# Patient Record
Sex: Female | Born: 1950
Health system: Southern US, Community
[De-identification: ages and names within clinical notes are randomized; demographics above are authoritative.]

## PROBLEM LIST (undated history)

## (undated) DIAGNOSIS — R319 Hematuria, unspecified: Secondary | ICD-10-CM

## (undated) DIAGNOSIS — R112 Nausea with vomiting, unspecified: Secondary | ICD-10-CM

## (undated) DIAGNOSIS — R300 Dysuria: Secondary | ICD-10-CM

## (undated) DIAGNOSIS — R0789 Other chest pain: Principal | ICD-10-CM

## (undated) DIAGNOSIS — Z87442 Personal history of urinary calculi: Secondary | ICD-10-CM

## (undated) DIAGNOSIS — Z973 Presence of spectacles and contact lenses: Secondary | ICD-10-CM

## (undated) DIAGNOSIS — Z9889 Other specified postprocedural states: Secondary | ICD-10-CM

## (undated) DIAGNOSIS — R350 Frequency of micturition: Secondary | ICD-10-CM

## (undated) DIAGNOSIS — N201 Calculus of ureter: Secondary | ICD-10-CM

## (undated) DIAGNOSIS — E669 Obesity, unspecified: Secondary | ICD-10-CM

## (undated) DIAGNOSIS — R351 Nocturia: Secondary | ICD-10-CM

## (undated) DIAGNOSIS — E785 Hyperlipidemia, unspecified: Secondary | ICD-10-CM

## (undated) DIAGNOSIS — M1990 Unspecified osteoarthritis, unspecified site: Secondary | ICD-10-CM

## (undated) DIAGNOSIS — E559 Vitamin D deficiency, unspecified: Secondary | ICD-10-CM

## (undated) DIAGNOSIS — I10 Essential (primary) hypertension: Secondary | ICD-10-CM

## (undated) DIAGNOSIS — R7303 Prediabetes: Secondary | ICD-10-CM

## (undated) DIAGNOSIS — E1169 Type 2 diabetes mellitus with other specified complication: Secondary | ICD-10-CM

## (undated) DIAGNOSIS — R3915 Urgency of urination: Secondary | ICD-10-CM

## (undated) DIAGNOSIS — Z87448 Personal history of other diseases of urinary system: Secondary | ICD-10-CM

## (undated) DIAGNOSIS — K219 Gastro-esophageal reflux disease without esophagitis: Secondary | ICD-10-CM

## (undated) DIAGNOSIS — M549 Dorsalgia, unspecified: Secondary | ICD-10-CM

## (undated) HISTORY — PX: CHOLECYSTECTOMY: SHX55

## (undated) HISTORY — DX: Type 2 diabetes mellitus with other specified complication: E11.69

## (undated) HISTORY — PX: EXTRACORPOREAL SHOCK WAVE LITHOTRIPSY: SHX1557

## (undated) HISTORY — DX: Vitamin D deficiency, unspecified: E55.9

## (undated) HISTORY — DX: Other chest pain: R07.89

## (undated) HISTORY — DX: Obesity, unspecified: E66.9

## (undated) HISTORY — DX: Personal history of other diseases of urinary system: Z87.448

## (undated) HISTORY — DX: Hyperlipidemia, unspecified: E78.5

## (undated) HISTORY — DX: Dorsalgia, unspecified: M54.9

---

## 1998-12-13 ENCOUNTER — Encounter: Payer: Self-pay | Admitting: Family Medicine

## 1998-12-13 ENCOUNTER — Ambulatory Visit (HOSPITAL_COMMUNITY): Admission: RE | Admit: 1998-12-13 | Discharge: 1998-12-13 | Payer: Self-pay | Admitting: Family Medicine

## 2001-06-23 ENCOUNTER — Encounter: Payer: Self-pay | Admitting: Family Medicine

## 2001-06-23 ENCOUNTER — Ambulatory Visit (HOSPITAL_COMMUNITY): Admission: RE | Admit: 2001-06-23 | Discharge: 2001-06-23 | Payer: Self-pay | Admitting: Family Medicine

## 2001-10-10 ENCOUNTER — Ambulatory Visit (HOSPITAL_COMMUNITY): Admission: RE | Admit: 2001-10-10 | Discharge: 2001-10-10 | Payer: Self-pay | Admitting: Gastroenterology

## 2004-11-09 ENCOUNTER — Encounter: Admission: RE | Admit: 2004-11-09 | Discharge: 2004-11-09 | Payer: Self-pay | Admitting: Family Medicine

## 2011-03-01 ENCOUNTER — Other Ambulatory Visit: Payer: Self-pay | Admitting: Family Medicine

## 2011-03-01 ENCOUNTER — Other Ambulatory Visit (HOSPITAL_COMMUNITY)
Admission: RE | Admit: 2011-03-01 | Discharge: 2011-03-01 | Disposition: A | Discharge status: Home / Self Care | Payer: BC Managed Care – PPO | Source: Ambulatory Visit | Attending: Family Medicine | Admitting: Family Medicine

## 2011-03-01 DIAGNOSIS — Z0000 Encounter for general adult medical examination without abnormal findings: Secondary | ICD-10-CM | POA: Insufficient documentation

## 2011-04-20 ENCOUNTER — Other Ambulatory Visit: Payer: Self-pay | Admitting: Gastroenterology

## 2012-11-25 ENCOUNTER — Emergency Department (HOSPITAL_COMMUNITY): Payer: BC Managed Care – PPO

## 2012-11-25 ENCOUNTER — Emergency Department (HOSPITAL_COMMUNITY)
Admission: EM | Admit: 2012-11-25 | Discharge: 2012-11-26 | Disposition: A | Discharge status: Home / Self Care | Payer: BC Managed Care – PPO | Attending: Emergency Medicine | Admitting: Emergency Medicine

## 2012-11-25 ENCOUNTER — Encounter (HOSPITAL_COMMUNITY): Payer: Self-pay | Admitting: *Deleted

## 2012-11-25 DIAGNOSIS — E119 Type 2 diabetes mellitus without complications: Secondary | ICD-10-CM | POA: Insufficient documentation

## 2012-11-25 DIAGNOSIS — Z79899 Other long term (current) drug therapy: Secondary | ICD-10-CM | POA: Insufficient documentation

## 2012-11-25 DIAGNOSIS — N200 Calculus of kidney: Secondary | ICD-10-CM | POA: Insufficient documentation

## 2012-11-25 DIAGNOSIS — N390 Urinary tract infection, site not specified: Secondary | ICD-10-CM | POA: Insufficient documentation

## 2012-11-25 LAB — CBC WITH DIFFERENTIAL/PLATELET
Basophils Relative: 0 % (ref 0–1)
Eosinophils Absolute: 0.2 10*3/uL (ref 0.0–0.7)
Eosinophils Relative: 2 % (ref 0–5)
HCT: 41.9 % (ref 36.0–46.0)
Hemoglobin: 15.4 g/dL — ABNORMAL HIGH (ref 12.0–15.0)
Lymphs Abs: 4.1 10*3/uL — ABNORMAL HIGH (ref 0.7–4.0)
MCH: 31.2 pg (ref 26.0–34.0)
MCHC: 36.8 g/dL — ABNORMAL HIGH (ref 30.0–36.0)
MCV: 84.8 fL (ref 78.0–100.0)
Monocytes Absolute: 0.7 10*3/uL (ref 0.1–1.0)
Monocytes Relative: 7 % (ref 3–12)
Neutrophils Relative %: 50 % (ref 43–77)

## 2012-11-25 LAB — COMPREHENSIVE METABOLIC PANEL
Albumin: 3.8 g/dL (ref 3.5–5.2)
BUN: 15 mg/dL (ref 6–23)
Creatinine, Ser: 0.71 mg/dL (ref 0.50–1.10)
GFR calc Af Amer: >90 mL/min (ref >90)
Glucose, Bld: 237 mg/dL — ABNORMAL HIGH (ref 70–99)
Total Protein: 7 g/dL (ref 6.0–8.3)

## 2012-11-25 LAB — URINE MICROSCOPIC-ADD ON

## 2012-11-25 LAB — URINALYSIS, ROUTINE W REFLEX MICROSCOPIC
Glucose, UA: NEGATIVE mg/dL
Ketones, ur: NEGATIVE mg/dL
Nitrite: NEGATIVE
Protein, ur: 100 mg/dL — AB
pH: 5.5 (ref 5.0–8.0)

## 2012-11-25 MED ORDER — SODIUM CHLORIDE 0.9 % IV SOLN
Freq: Once | INTRAVENOUS | Status: AC
  Administered 2012-11-25: 21:00:00 via INTRAVENOUS

## 2012-11-25 MED ORDER — DEXTROSE 5 % IV SOLN
1.0000 g | INTRAVENOUS | Status: DC
  Administered 2012-11-25: 1 g via INTRAVENOUS
  Filled 2012-11-25: qty 10

## 2012-11-25 MED ORDER — ONDANSETRON 4 MG PO TBDP
8.0000 mg | ORAL_TABLET | Freq: Once | ORAL | Status: AC
  Administered 2012-11-25: 8 mg via ORAL
  Filled 2012-11-25: qty 2

## 2012-11-25 MED ORDER — KETOROLAC TROMETHAMINE 30 MG/ML IJ SOLN
30.0000 mg | Freq: Once | INTRAMUSCULAR | Status: AC
  Administered 2012-11-25: 30 mg via INTRAVENOUS
  Filled 2012-11-25: qty 1

## 2012-11-25 MED ORDER — ONDANSETRON HCL 4 MG/2ML IJ SOLN
4.0000 mg | Freq: Once | INTRAMUSCULAR | Status: AC
  Administered 2012-11-25: 4 mg via INTRAVENOUS
  Filled 2012-11-25: qty 2

## 2012-11-25 MED ORDER — HYDROMORPHONE HCL PF 1 MG/ML IJ SOLN
1.0000 mg | Freq: Once | INTRAMUSCULAR | Status: AC
  Administered 2012-11-25: 1 mg via INTRAVENOUS
  Filled 2012-11-25: qty 1

## 2012-11-25 NOTE — ED Provider Notes (Signed)
CSN: [id number]     Arrival date & time 11/25/12  2029 History   First MD Initiated Contact with Patient 11/25/12 2101     Chief Complaint  Patient presents with  . Flank Pain   (Consider location/radiation/quality/duration/timing/severity/associated sxs/prior Treatment) HPI Comments: Patient states, last night.  She started having some dysuria, urgency.  This has developed into right flank pain, nausea.  She, states she feels, like she is not emptying her bladder completely.  She does have a remote history of kidney stones years ago, requiring lithotripsy is no problems with the.   Patient is a 62 y.o. female presenting with flank pain. The history is provided by the patient.  Flank Pain This is a new problem. The current episode started yesterday. The problem occurs constantly. The problem has been gradually worsening. Associated symptoms include abdominal pain, chills, nausea and urinary symptoms. Pertinent negatives include no chest pain, fever, vomiting or weakness. Exacerbated by: Urinating. She has tried nothing for the symptoms. The treatment provided no relief.    Past Medical History  Diagnosis Date  . Kidney stones   . Diabetes mellitus without complication    Past Surgical History  Procedure Laterality Date  . Kidney stone surgery    . Cholecystectomy     History reviewed. No pertinent family history. History  Substance Use Topics  . Smoking status: Never Smoker   . Smokeless tobacco: Not on file  . Alcohol Use: No   OB History   Grav Para Term Preterm Abortions TAB SAB Ect Mult Living                 Review of Systems  Constitutional: Positive for chills. Negative for fever.  Cardiovascular: Negative for chest pain.  Gastrointestinal: Positive for nausea and abdominal pain. Negative for vomiting, diarrhea and constipation.  Genitourinary: Positive for dysuria, frequency and flank pain.  Musculoskeletal: Positive for back pain.  Neurological: Negative for  weakness.  All other systems reviewed and are negative.    Allergies  Review of patient's allergies indicates no known allergies.  Home Medications   Current Outpatient Rx  Name  Route  Sig  Dispense  Refill  . hyoscyamine (LEVSIN, ANASPAZ) 0.125 MG tablet   Oral   Take 0.125 mg by mouth every 4 (four) hours as needed for cramping.         . Vitamin D, Ergocalciferol, (DRISDOL) 50000 UNITS CAPS capsule   Oral   Take 50,000 Units by mouth.         . ciprofloxacin (CIPRO) 500 MG tablet   Oral   Take 1 tablet (500 mg total) by mouth 2 (two) times daily.   14 tablet   0   . HYDROcodone-acetaminophen (NORCO/VICODIN) 5-325 MG per tablet   Oral   Take 2 tablets by mouth every 4 (four) hours as needed for pain.   10 tablet   0   . promethazine (PHENERGAN) 25 MG tablet   Oral   Take 1 tablet (25 mg total) by mouth every 6 (six) hours as needed for nausea.   30 tablet   0    BP 119/56  Pulse 70  Temp(Src) 97.5 F (36.4 C) (Oral)  Resp 20  Ht 5\' 4"  (1.626 m)  Wt 186 lb 9.6 oz (84.641 kg)  BMI 32.01 kg/m2  SpO2 97% Physical Exam  Nursing note and vitals reviewed. Constitutional: She is oriented to person, place, and time. She appears well-developed and well-nourished.  HENT:  Head: Normocephalic.  Eyes: Pupils are equal, round, and reactive to light.  Neck: Normal range of motion.  Cardiovascular: Normal rate and regular rhythm.   Pulmonary/Chest: Effort normal.  Abdominal: Soft. She exhibits no distension. There is tenderness in the suprapubic area. There is CVA tenderness.  Musculoskeletal: Normal range of motion.  Neurological: She is alert and oriented to person, place, and time.  Skin: Skin is warm and dry. No rash noted. No erythema.    ED Course  Procedures (including critical care time) Labs Review Labs Reviewed  CBC WITH DIFFERENTIAL - Abnormal; Notable for the following:    Hemoglobin 15.4 (*)    MCHC 36.8 (*)    Lymphs Abs 4.1 (*)    All other  components within normal limits  COMPREHENSIVE METABOLIC PANEL - Abnormal; Notable for the following:    Glucose, Bld 237 (*)    Total Bilirubin 0.2 (*)    All other components within normal limits  URINALYSIS, ROUTINE W REFLEX MICROSCOPIC - Abnormal; Notable for the following:    APPearance CLOUDY (*)    Hgb urine dipstick LARGE (*)    Protein, ur 100 (*)    Leukocytes, UA MODERATE (*)    All other components within normal limits  URINE MICROSCOPIC-ADD ON - Abnormal; Notable for the following:    Squamous Epithelial / LPF FEW (*)    Crystals CA OXALATE CRYSTALS (*)    All other components within normal limits   Imaging Review Ct Abdomen Pelvis Wo Contrast  11/25/2012   *RADIOLOGY REPORT*  Clinical Data: Right flank pain.  History of stones.  CT ABDOMEN AND PELVIS WITHOUT CONTRAST  Technique:  Multidetector CT imaging of the abdomen and pelvis was performed following the standard protocol without intravenous contrast.  Comparison: None.  Findings:  Lung bases: Distal esophagus mildly distended with air.  Mild bibasilar atelectasis.  Negative for pleural or pericardial effusion.  Abdomen/pelvis:  There is diffuse fatty infiltration of the liver. No focal hepatic lesion is identified.  There is no biliary ductal dilatation in this patient status post cholecystectomy.  The spleen, pancreas, adrenal glands, and left kidney are within normal limits.  No intrarenal calculi are seen on the left.  There are least two small stones in the calyces of the lower pole of the right kidney, the largest measuring 3 mm.   In the proximal right ureter is an oval stone measuring 10 x 5 mm axial diameter and 14 mm in length.  Distal to this stone, there is moderate right ureteral dilatation, secondary to a second, distal ureteral stone that measures 3 x 3 mm, positioned approximately 2 cm proximal to the ureterovesical junction.  There is moderate right hydronephrosis.  Negative for hydronephrosis or ureteral dilatation  on the left.  No left ureteral stone is seen.  Urinary bladder appears normal.  Stomach is decompressed.  Small bowel loops normal in caliber. There is diverticulosis of the colon, without acute inflammatory change.  The uterus has a lobular contour, raising the possibility of uterine fibroids.  No adnexal masses identified.  Abdominal aorta normal in caliber. Negative for lymphadenopathy, ascites, or free air.  Vertebral bodies are normal in height and alignment.  No significant degenerative changes of the lumbar spine for patient age.  IMPRESSION:  1.  Moderate right hydroureteronephrosis.  There are two right ureteral stones.  A proximal right ureteral stone is large in size measuring 10 x 5 x 14 mm.  A distal right ureteral stone measures 3 mm. 2.  Right intrarenal calculi. 3.  Negative for urinary tract stone disease on the left. 3.  Colonic diverticulosis, uncomplicated. 4.  Lobulated contour of the uterus suggests the presence of uterine fibroids.  the presence of uterine fibroids. 5.  Fatty infiltration of the liver.   Original Report Authenticated By: Britta Mccreedy, M.D.    MDM   1. UTI (lower urinary tract infection)   2. Kidney stone         Arman Filter, NP 11/26/12 2000  Arman Filter, NP 11/26/12 2000

## 2012-11-25 NOTE — ED Notes (Signed)
Left flank pain since this evening. Hx. Of kidney stones. Has not urinated since 1930. Has umbilical pressure. Took hyoscyamine 0.125 mg but threw it up.[name]

## 2012-11-25 NOTE — ED Notes (Signed)
Bladder scan results 53 ml.  Pt voided just prior to scan

## 2012-11-26 MED ORDER — PROMETHAZINE HCL 25 MG PO TABS: 25.0000 mg | ORAL_TABLET | Freq: Four times a day (QID) | ORAL | Status: DC | PRN

## 2012-11-26 MED ORDER — CIPROFLOXACIN HCL 500 MG PO TABS: 500.0000 mg | ORAL_TABLET | Freq: Two times a day (BID) | ORAL | Status: DC

## 2012-11-26 MED ORDER — HYDROCODONE-ACETAMINOPHEN 5-325 MG PO TABS: 2.0000 | ORAL_TABLET | ORAL | Status: DC | PRN

## 2012-11-26 NOTE — ED Provider Notes (Signed)
Medical screening examination/treatment/procedure(s) were performed by non-physician practitioner and as supervising physician I was immediately available for consultation/collaboration. Darris Staiger, MD, FACEP   China Deitrick L Kristy Catoe, MD 11/26/12 2327 

## 2012-11-27 ENCOUNTER — Ambulatory Visit (HOSPITAL_COMMUNITY)
Admission: RE | Admit: 2012-11-27 | Discharge: 2012-11-28 | Disposition: A | Discharge status: Home / Self Care | Payer: BC Managed Care – PPO | Source: Ambulatory Visit | Attending: Urology | Admitting: Urology

## 2012-11-27 ENCOUNTER — Other Ambulatory Visit (HOSPITAL_COMMUNITY): Payer: Self-pay | Admitting: *Deleted

## 2012-11-27 ENCOUNTER — Encounter (HOSPITAL_COMMUNITY): Payer: Self-pay | Admitting: Anesthesiology

## 2012-11-27 ENCOUNTER — Other Ambulatory Visit: Payer: Self-pay | Admitting: Urology

## 2012-11-27 ENCOUNTER — Encounter (HOSPITAL_COMMUNITY)
Admission: RE | Disposition: A | Discharge status: Home / Self Care | Payer: Self-pay | Source: Ambulatory Visit | Attending: Urology

## 2012-11-27 ENCOUNTER — Ambulatory Visit (HOSPITAL_COMMUNITY): Payer: BC Managed Care – PPO | Admitting: Anesthesiology

## 2012-11-27 ENCOUNTER — Encounter (HOSPITAL_COMMUNITY): Payer: Self-pay | Admitting: *Deleted

## 2012-11-27 DIAGNOSIS — N201 Calculus of ureter: Secondary | ICD-10-CM | POA: Insufficient documentation

## 2012-11-27 HISTORY — PX: HOLMIUM LASER APPLICATION: SHX5852

## 2012-11-27 HISTORY — PX: CYSTOSCOPY WITH URETEROSCOPY: SHX5123

## 2012-11-27 HISTORY — DX: Gastro-esophageal reflux disease without esophagitis: K21.9

## 2012-11-27 LAB — GLUCOSE, CAPILLARY
Glucose-Capillary: 180 mg/dL — ABNORMAL HIGH (ref 70–99)
Glucose-Capillary: 188 mg/dL — ABNORMAL HIGH (ref 70–99)

## 2012-11-27 SURGERY — CYSTOSCOPY WITH URETEROSCOPY
Anesthesia: General | Site: Ureter | Laterality: Right | Wound class: Clean Contaminated

## 2012-11-27 MED ORDER — ACETAMINOPHEN 10 MG/ML IV SOLN
INTRAVENOUS | Status: DC | PRN
  Administered 2012-11-27: 1000 mg via INTRAVENOUS

## 2012-11-27 MED ORDER — IOHEXOL 300 MG/ML  SOLN
INTRAMUSCULAR | Status: AC
  Filled 2012-11-27: qty 1

## 2012-11-27 MED ORDER — LIDOCAINE HCL (CARDIAC) 10 MG/ML IV SOLN
INTRAVENOUS | Status: DC | PRN
  Administered 2012-11-27: 80 mg via INTRAVENOUS

## 2012-11-27 MED ORDER — METOCLOPRAMIDE HCL 5 MG/ML IJ SOLN
INTRAMUSCULAR | Status: DC | PRN
  Administered 2012-11-27: 10 mg via INTRAVENOUS

## 2012-11-27 MED ORDER — PROPOFOL 10 MG/ML IV BOLUS
INTRAVENOUS | Status: DC | PRN
  Administered 2012-11-27: 200 mg via INTRAVENOUS

## 2012-11-27 MED ORDER — CIPROFLOXACIN IN D5W 400 MG/200ML IV SOLN
INTRAVENOUS | Status: AC
  Filled 2012-11-27: qty 200

## 2012-11-27 MED ORDER — MORPHINE SULFATE 2 MG/ML IJ SOLN
2.0000 mg | INTRAMUSCULAR | Status: DC | PRN
  Administered 2012-11-27 (×3): 2 mg via INTRAVENOUS
  Filled 2012-11-27 (×3): qty 1

## 2012-11-27 MED ORDER — IOHEXOL 300 MG/ML  SOLN
INTRAMUSCULAR | Status: DC | PRN
  Administered 2012-11-27: 50 mL

## 2012-11-27 MED ORDER — KETOROLAC TROMETHAMINE 30 MG/ML IJ SOLN: 15.0000 mg | Freq: Once | INTRAMUSCULAR | Status: AC | PRN

## 2012-11-27 MED ORDER — FENTANYL CITRATE 0.05 MG/ML IJ SOLN
INTRAMUSCULAR | Status: DC | PRN
  Administered 2012-11-27: 50 ug via INTRAVENOUS

## 2012-11-27 MED ORDER — DEXAMETHASONE SODIUM PHOSPHATE 10 MG/ML IJ SOLN
INTRAMUSCULAR | Status: DC | PRN
  Administered 2012-11-27: 10 mg via INTRAVENOUS

## 2012-11-27 MED ORDER — LACTATED RINGERS IV SOLN
INTRAVENOUS | Status: DC | PRN
  Administered 2012-11-27: 22:00:00 via INTRAVENOUS

## 2012-11-27 MED ORDER — ONDANSETRON HCL 4 MG/2ML IJ SOLN
4.0000 mg | Freq: Three times a day (TID) | INTRAMUSCULAR | Status: DC | PRN
  Administered 2012-11-27 (×2): 4 mg via INTRAVENOUS
  Filled 2012-11-27: qty 2

## 2012-11-27 MED ORDER — PROMETHAZINE HCL 25 MG/ML IJ SOLN: 6.2500 mg | INTRAMUSCULAR | Status: DC | PRN

## 2012-11-27 MED ORDER — SODIUM CHLORIDE 0.9 % IR SOLN
Status: DC | PRN
  Administered 2012-11-27: 4000 mL

## 2012-11-27 MED ORDER — CIPROFLOXACIN IN D5W 400 MG/200ML IV SOLN
400.0000 mg | INTRAVENOUS | Status: AC
  Administered 2012-11-27: 400 mg via INTRAVENOUS

## 2012-11-27 MED ORDER — HYDROCODONE-ACETAMINOPHEN 5-325 MG PO TABS: 1.0000 | ORAL_TABLET | Freq: Four times a day (QID) | ORAL | Status: DC | PRN

## 2012-11-27 MED ORDER — SODIUM CHLORIDE 0.9 % IV SOLN
INTRAVENOUS | Status: DC
  Administered 2012-11-27: 15:00:00 via INTRAVENOUS

## 2012-11-27 MED ORDER — KETOROLAC TROMETHAMINE 30 MG/ML IJ SOLN
INTRAMUSCULAR | Status: DC | PRN
  Administered 2012-11-27: 30 mg via INTRAVENOUS

## 2012-11-27 MED ORDER — FENTANYL CITRATE 0.05 MG/ML IJ SOLN: 25.0000 ug | INTRAMUSCULAR | Status: DC | PRN

## 2012-11-27 MED ORDER — SODIUM CHLORIDE 0.9 % IR SOLN
Status: DC | PRN
  Administered 2012-11-27: 1000 mL

## 2012-11-27 SURGICAL SUPPLY — 13 items
BAG URO CATCHER STRL LF (DRAPE) ×2 IMPLANT
BASKET ZERO TIP NITINOL 2.4FR (BASKET) ×2 IMPLANT
CATH INTERMIT  6FR 70CM (CATHETERS) ×2 IMPLANT
CLOTH BEACON ORANGE TIMEOUT ST (SAFETY) ×2 IMPLANT
DRAPE CAMERA CLOSED 9X96 (DRAPES) ×2 IMPLANT
GLOVE BIOGEL M STRL SZ7.5 (GLOVE) ×2 IMPLANT
GOWN STRL NON-REIN LRG LVL3 (GOWN DISPOSABLE) ×4 IMPLANT
LASER FIBER DISP (UROLOGICAL SUPPLIES) ×2 IMPLANT
MANIFOLD NEPTUNE II (INSTRUMENTS) ×2 IMPLANT
PACK CYSTO (CUSTOM PROCEDURE TRAY) ×2 IMPLANT
STENT CONTOUR 6FRX24X.038 (STENTS) ×2 IMPLANT
TUBING CONNECTING 10 (TUBING) ×2 IMPLANT
WIRE COONS/BENSON .038X145CM (WIRE) IMPLANT

## 2012-11-27 NOTE — Interval H&P Note (Signed)
History and Physical Interval Note:  11/27/2012 11:02 PM  Lauren James  has presented today for surgery, with the diagnosis of right ureteral obstruction  The various methods of treatment have been discussed with the patient and family. After consideration of risks, benefits and other options for treatment, the patient has consented to  Procedure(s): CYSTOSCOPY WITH RIGHT  URETEROSCOPY AND STENT PLACEMENT (Right) as a surgical intervention .  The patient's history has been reviewed, patient examined, no change in status, stable for surgery.  I have reviewed the patient's chart and labs.  Questions were answered to the patient's satisfaction.     [name],LES

## 2012-11-27 NOTE — Transfer of Care (Signed)
Immediate Anesthesia Transfer of Care Note  Patient: Lauren James  Procedure(s) Performed: Procedure(s): CYSTOSCOPY WITH RIGHT  URETEROSCOPY AND STENT PLACEMENT  (Right) HOLMIUM LASER APPLICATION (Right)  Patient Location: PACU  Anesthesia Type:General  Level of Consciousness: awake, alert , oriented and patient cooperative  Airway & Oxygen Therapy: Patient Spontanous Breathing and Patient connected to face mask oxygen  Post-op Assessment: Report given to PACU RN, Post -op Vital signs reviewed and stable and Patient moving all extremities X 4  Post vital signs: stable  Complications: No apparent anesthesia complications

## 2012-11-27 NOTE — H&P (Signed)
Active Problems Problems  1. Nephrolithiasis 592.0  History of Present Illness  This is a 62 year old female who presents in follow-up after ED visit for right flank pain revealed a large proximal ureteral stone and a smaller distal stone with obstruction.  The patient states that her symptoms started 2 nights prior with some dysuria and urgency.  He subsequently developed right flank pain and nausea.  The pain has been gradually worsening.  Prior to presenting to the emergency department she was unable to comfortable.  On presentation to the ER the patient was afebrile with stable vital signs.  Urine analysis revealed white cells and red cells in her urine, but nitrite negative.  There are also calcium oxalate crystals.  No urine culture was obtained, but the patient was given a shot of Rocephin in the ED, and discharged on 500 mg of ciprofloxacin twice a day.  Her electrolytes were within normal limits, BUN was 15 and creatinine 0.71.  White blood cell count was 10, hemoglobin 15.4, and platelets 192.  CT scan noncontrast abdomen and pelvis reveals 2 right ureteral stones the largest in the proximal ureter measuring 10 x 5 x 14 mm.  Distal right ureteral stone measures 5-6 mm, which is just proximal to her UVJ.  There is no evidence of stones on the left side.  Patient was discharged home with ciprofloxacin 500 mg twice a day, Flomax 0.4 mg daily, and Vicodin.  In the interval her pain has been poorly controlled, she has been nauseous and had emesis several times.  She reports feeling febril, although has not taken her temperature.  She complains of urgency, dysuria, and the feeling of being unable to empty her bladder.  Here in the office the patient is diaphoretic, clearly uncomfortable, and did have an episode of emesis.  The patient has a history of kidney stones, and she has had extracorporal shockwave lithotripsy in the past.   Current Meds 1. Ciprofloxacin HCl 500 MG Oral Tablet;  Therapy: 02Sep2014 to 2. Hydrocodone-Acetaminophen 5-325 MG Oral Tablet; Therapy: 02Sep2014 to 3. Hyoscyamine Sulfate 0.125 MG Sublingual Tablet Sublingual; Therapy: 14Aug2014 to 4. Vitamin D (Ergocalciferol) 50000 UNIT Oral Capsule; Therapy: 17Oct2013 to  Review of Systems  A 12 point review of systems was obtained and is as stated in the HPI with the following additions: Patient endorses heartburn/indigestion, diarrhea, night sweats, fatigue, blurred vision, double vision, sinus problems, joint pain, headache, dizziness.     Physical Exam Constitutional: well developed . In acute distress.  ENT:. The ears and nose are normal in appearance.  Neck: The appearance of the neck is normal and no neck mass is present.  Pulmonary: No respiratory distress, normal respiratory rhythm and effort and clear bilateral breath sounds . No accessory muscle use.  Cardiovascular: Heart rate and rhythm are normal . The arterial pulses are normal. No peripheral edema. No obvious murmurs are appreciated.  Abdomen: The abdomen is soft and nontender. No suprapubic tenderness. No CVA tenderness.  Skin: Normal skin turgor, no visible rash and no visible skin lesions.  Neuro/Psych:. Mood and affect are appropriate.    Results/Data  I personally reviewed the patient's CAT scan which is dated November 25, 2012.  Please see the HPI for pertinent findings. Condition I reviewed the patient's lab values and all pertinent information is stated in the HPI.     Assessment  Acute right-sided renal colic with 2 obstructing stones, poorly controlled pain, subjective fevers, and unable to keep anything down.  I discussed the  patient's options and recommended that she have at least a stent placed today with a possible attempt of retrieving the lower stone fragment.  Further stone removal of her proximal stone will be addressed at a separate time.   Plan  #1 I given the patient 15 mg of IM Toradol here in the office #2 I  discussed the case with Dr. Laverle Patter who is on-call, I've added her the pending OR cases for this afternoon to include right retrograde pyelogram,  right ureteroscopy,  right laser lithotripsy, right ureteral stent placement.  I gone over the procedure in detail including the risks and benefits.  The patient understands that she'll have a stent placed afterwards and likely require additional procedures. #3 I have sent the patient to short stay unit for insertion of an IV, IV fluids, and more aggressive pain control.   Signatures Electronically signed by : Berniece Salines, M.D.; Nov 27 2012  1:38PM

## 2012-11-27 NOTE — Op Note (Signed)
Preoperative diagnosis: Right ureteral calculi  Postoperative diagnosis: Right ureteral calculi  Procedure:  1. Cystoscopy 2. Right ureteroscopy and stone removal 3. Ureteroscopic laser lithotripsy 4. Right ureteral stent placement (6 x 24)  5. Right retrograde pyelography with interpretation  Surgeon: Moody Bruins. M.D.  Anesthesia: General  Complications: None  Intraoperative findings: Right retrograde pyelography demonstrated a filling defect within the distal right ureter consistent with the patient's known calculus without other abnormalities in the distal ureter.  There was a radioopaque stone noted in the proximal ureter as seen on preoperative imaging.  EBL: Minimal  Specimens: 1. Right distal ureteral calculus  Disposition of specimens: [facility] for stone analysis  Indication: Lauren James is a 62 y.o. year old patient with urolithiasis. She was found to have a large proximal ureteral stone and a smaller distal ureteral stone on the right. After reviewing the management options for treatment, the patient elected to proceed with the above surgical procedure(s). We have discussed the potential benefits and risks of the procedure, side effects of the proposed treatment, the likelihood of the patient achieving the goals of the procedure, and any potential problems that might occur during the procedure or recuperation. Informed consent has been obtained.  Description of procedure:  The patient was taken to the operating room and general anesthesia was induced.  The patient was placed in the dorsal lithotomy position, prepped and draped in the usual sterile fashion, and preoperative antibiotics were administered. A preoperative time-out was performed.   Cystourethroscopy was performed.  The patient's urethra was examined and was normal. The bladder was then systematically examined in its entirety. There was no evidence for any bladder tumors, stones,  or other mucosal pathology.    Attention then turned to the right ureteral orifice and a ureteral catheter was used to intubate the ureteral orifice.  Omnipaque contrast was injected through the ureteral catheter and a retrograde pyelogram was performed with findings as dictated above.  A 0.38 sensor guidewire was then advanced up the right ureter into the renal pelvis under fluoroscopic guidance. The 6 Fr semirigid ureteroscope was then advanced into the ureter next to the guidewire and the calculus was identified.   The stone was then fragmented with the 365 micron holmium laser fiber on a setting of 0.6 J and frequency of 6 Hz.   All distal stones were then removed from the ureter with a zero tip nitinol basket.  Reinspection of the ureter revealed no remaining visible stones or fragments within the mid/distal ureter.   The wire was then backloaded through the cystoscope and a ureteral stent was advance over the wire using Seldinger technique.  The stent was positioned appropriately under fluoroscopic and cystoscopic guidance.  The wire was then removed with an adequate stent curl noted in the renal pelvis as well as in the bladder.  The bladder was then emptied and the procedure ended.  The patient appeared to tolerate the procedure well and without complications.  The patient was able to be awakened and transferred to the recovery unit in satisfactory condition.   The patient will plan to follow up with Dr. Marlou Porch with a KUB to further discuss definitive management of her proximal right ureteral stone.

## 2012-11-27 NOTE — Progress Notes (Signed)
Report to Pam

## 2012-11-27 NOTE — Discharge Instructions (Signed)

## 2012-11-27 NOTE — Progress Notes (Signed)
Patient and husband informed that surgery is going to be later. They verbalize understanding.

## 2012-11-27 NOTE — Anesthesia Preprocedure Evaluation (Addendum)
Anesthesia Evaluation  Patient identified by MRN, date of birth, ID band Patient awake    Reviewed: Allergy & Precautions, H&P , NPO status , Patient's Chart, lab work & pertinent test results  Airway Mallampati: III TM Distance: <3 FB Neck ROM: Full    Dental no notable dental hx.    Pulmonary neg pulmonary ROS,  breath sounds clear to auscultation  Pulmonary exam normal       Cardiovascular negative cardio ROS  Rhythm:Regular Rate:Normal     Neuro/Psych negative neurological ROS  negative psych ROS   GI/Hepatic Neg liver ROS,   Endo/Other  diabetes, Type 2  Renal/GU negative Renal ROS  negative genitourinary   Musculoskeletal negative musculoskeletal ROS (+)   Abdominal   Peds negative pediatric ROS (+)  Hematology negative hematology ROS (+)   Anesthesia Other Findings   Reproductive/Obstetrics negative OB ROS                          Anesthesia Physical Anesthesia Plan  ASA: II  Anesthesia Plan: General   Post-op Pain Management:    Induction: Intravenous  Airway Management Planned: LMA  Additional Equipment:   Intra-op Plan:   Post-operative Plan: Extubation in OR  Informed Consent: I have reviewed the patients History and Physical, chart, labs and discussed the procedure including the risks, benefits and alternatives for the proposed anesthesia with the patient or authorized representative who has indicated his/her understanding and acceptance.   Dental advisory given  Plan Discussed with: CRNA and Surgeon  Anesthesia Plan Comments: ([place]real at 0630)     Anesthesia Quick Evaluation

## 2012-11-28 ENCOUNTER — Encounter (HOSPITAL_COMMUNITY): Payer: Self-pay | Admitting: Urology

## 2012-12-02 NOTE — Anesthesia Postprocedure Evaluation (Signed)
  Anesthesia Post-op Note  Patient: Lauren James  Procedure(s) Performed: Procedure(s) (LRB): CYSTOSCOPY WITH RIGHT  URETEROSCOPY AND STENT PLACEMENT  (Right) HOLMIUM LASER APPLICATION (Right)  Patient Location: PACU  Anesthesia Type: General  Level of Consciousness: awake and alert   Airway and Oxygen Therapy: Patient Spontanous Breathing  Post-op Pain: mild  Post-op Assessment: Post-op Vital signs reviewed, Patient's Cardiovascular Status Stable, Respiratory Function Stable, Patent Airway and No signs of Nausea or vomiting  Last Vitals:  Filed Vitals:   11/28/12 0030  BP: 131/79  Pulse:   Temp:   Resp:     Post-op Vital Signs: stable   Complications: No apparent anesthesia complications

## 2012-12-05 ENCOUNTER — Other Ambulatory Visit: Payer: Self-pay | Admitting: Urology

## 2012-12-06 ENCOUNTER — Encounter (HOSPITAL_BASED_OUTPATIENT_CLINIC_OR_DEPARTMENT_OTHER): Payer: Self-pay | Admitting: *Deleted

## 2012-12-06 NOTE — Progress Notes (Signed)
NPO AFTER MN. ARRIVES AT 1130. NEEDS HG , CBG AND EKG. MAY TAKE HYDROCODONE/ PHENERGAN IF NEEDED W/ SIPS OF WATER AM OF SURG.

## 2012-12-13 ENCOUNTER — Encounter (HOSPITAL_BASED_OUTPATIENT_CLINIC_OR_DEPARTMENT_OTHER): Payer: Self-pay | Admitting: Anesthesiology

## 2012-12-13 ENCOUNTER — Ambulatory Visit (HOSPITAL_BASED_OUTPATIENT_CLINIC_OR_DEPARTMENT_OTHER): Payer: BC Managed Care – PPO | Admitting: Anesthesiology

## 2012-12-13 ENCOUNTER — Other Ambulatory Visit: Payer: Self-pay

## 2012-12-13 ENCOUNTER — Ambulatory Visit (HOSPITAL_COMMUNITY): Payer: BC Managed Care – PPO

## 2012-12-13 ENCOUNTER — Encounter (HOSPITAL_BASED_OUTPATIENT_CLINIC_OR_DEPARTMENT_OTHER)
Admission: RE | Disposition: A | Discharge status: Home / Self Care | Payer: Self-pay | Source: Ambulatory Visit | Attending: Urology

## 2012-12-13 ENCOUNTER — Ambulatory Visit (HOSPITAL_BASED_OUTPATIENT_CLINIC_OR_DEPARTMENT_OTHER)
Admission: RE | Admit: 2012-12-13 | Discharge: 2012-12-13 | Disposition: A | Discharge status: Home / Self Care | Payer: BC Managed Care – PPO | Source: Ambulatory Visit | Attending: Urology | Admitting: Urology

## 2012-12-13 DIAGNOSIS — N200 Calculus of kidney: Secondary | ICD-10-CM | POA: Insufficient documentation

## 2012-12-13 DIAGNOSIS — R61 Generalized hyperhidrosis: Secondary | ICD-10-CM | POA: Insufficient documentation

## 2012-12-13 DIAGNOSIS — Z79899 Other long term (current) drug therapy: Secondary | ICD-10-CM | POA: Insufficient documentation

## 2012-12-13 HISTORY — DX: Presence of spectacles and contact lenses: Z97.3

## 2012-12-13 HISTORY — PX: HOLMIUM LASER APPLICATION: SHX5852

## 2012-12-13 HISTORY — DX: Calculus of ureter: N20.1

## 2012-12-13 HISTORY — DX: Frequency of micturition: R35.0

## 2012-12-13 HISTORY — PX: CYSTOSCOPY W/ URETERAL STENT PLACEMENT: SHX1429

## 2012-12-13 HISTORY — DX: Prediabetes: R73.03

## 2012-12-13 HISTORY — DX: Personal history of urinary calculi: Z87.442

## 2012-12-13 HISTORY — PX: URETEROSCOPY: SHX842

## 2012-12-13 HISTORY — DX: Dysuria: R30.0

## 2012-12-13 HISTORY — DX: Nocturia: R35.1

## 2012-12-13 HISTORY — DX: Urgency of urination: R39.15

## 2012-12-13 HISTORY — DX: Hematuria, unspecified: R31.9

## 2012-12-13 SURGERY — CYSTOSCOPY, WITH RETROGRADE PYELOGRAM AND URETERAL STENT INSERTION
Anesthesia: General | Site: Ureter | Laterality: Right | Wound class: Clean Contaminated

## 2012-12-13 MED ORDER — HYDROMORPHONE HCL PF 1 MG/ML IJ SOLN
0.2500 mg | INTRAMUSCULAR | Status: DC | PRN
  Filled 2012-12-13: qty 1

## 2012-12-13 MED ORDER — ONDANSETRON HCL 4 MG/2ML IJ SOLN
INTRAMUSCULAR | Status: DC | PRN
  Administered 2012-12-13: 4 mg via INTRAVENOUS

## 2012-12-13 MED ORDER — LACTATED RINGERS IV SOLN
INTRAVENOUS | Status: DC
  Administered 2012-12-13 (×3): via INTRAVENOUS
  Filled 2012-12-13: qty 1000

## 2012-12-13 MED ORDER — MEPERIDINE HCL 25 MG/ML IJ SOLN
6.2500 mg | INTRAMUSCULAR | Status: DC | PRN
Start: 2012-12-13 — End: 2012-12-13
  Filled 2012-12-13: qty 1

## 2012-12-13 MED ORDER — DEXAMETHASONE SODIUM PHOSPHATE 4 MG/ML IJ SOLN
INTRAMUSCULAR | Status: DC | PRN
  Administered 2012-12-13: 10 mg via INTRAVENOUS

## 2012-12-13 MED ORDER — FENTANYL CITRATE 0.05 MG/ML IJ SOLN
INTRAMUSCULAR | Status: DC | PRN
  Administered 2012-12-13 (×5): 50 ug via INTRAVENOUS

## 2012-12-13 MED ORDER — OXYCODONE HCL 5 MG/5ML PO SOLN
5.0000 mg | Freq: Once | ORAL | Status: DC | PRN
  Filled 2012-12-13: qty 5

## 2012-12-13 MED ORDER — KETOROLAC TROMETHAMINE 30 MG/ML IJ SOLN
INTRAMUSCULAR | Status: DC | PRN
  Administered 2012-12-13: 30 mg via INTRAVENOUS

## 2012-12-13 MED ORDER — SODIUM CHLORIDE 0.9 % IR SOLN
Status: DC | PRN
  Administered 2012-12-13: 9000 mL

## 2012-12-13 MED ORDER — LIDOCAINE HCL (CARDIAC) 20 MG/ML IV SOLN
INTRAVENOUS | Status: DC | PRN
  Administered 2012-12-13: 80 mg via INTRAVENOUS

## 2012-12-13 MED ORDER — MIDAZOLAM HCL 5 MG/5ML IJ SOLN
INTRAMUSCULAR | Status: DC | PRN
  Administered 2012-12-13: 2 mg via INTRAVENOUS

## 2012-12-13 MED ORDER — IOHEXOL 350 MG/ML SOLN
INTRAVENOUS | Status: DC | PRN
  Administered 2012-12-13: 10 mL

## 2012-12-13 MED ORDER — PROPOFOL 10 MG/ML IV BOLUS
INTRAVENOUS | Status: DC | PRN
  Administered 2012-12-13: 200 mg via INTRAVENOUS

## 2012-12-13 MED ORDER — OXYCODONE HCL 5 MG PO TABS
5.0000 mg | ORAL_TABLET | Freq: Once | ORAL | Status: DC | PRN
  Filled 2012-12-13: qty 1

## 2012-12-13 MED ORDER — CIPROFLOXACIN HCL 500 MG PO TABS: 500.0000 mg | ORAL_TABLET | Freq: Two times a day (BID) | ORAL | Status: DC

## 2012-12-13 MED ORDER — HYDROCODONE-ACETAMINOPHEN 5-325 MG PO TABS: 1.0000 | ORAL_TABLET | Freq: Four times a day (QID) | ORAL | Status: DC | PRN

## 2012-12-13 MED ORDER — PROMETHAZINE HCL 25 MG/ML IJ SOLN
6.2500 mg | INTRAMUSCULAR | Status: DC | PRN
  Administered 2012-12-13: 6.25 mg via INTRAVENOUS
  Filled 2012-12-13: qty 1

## 2012-12-13 MED ORDER — CIPROFLOXACIN IN D5W 400 MG/200ML IV SOLN
400.0000 mg | INTRAVENOUS | Status: AC
  Administered 2012-12-13: 400 mg via INTRAVENOUS
  Filled 2012-12-13: qty 200

## 2012-12-13 SURGICAL SUPPLY — 31 items
ADAPTER CATH URET PLST 4-6FR (CATHETERS) IMPLANT
BAG DRAIN URO-CYSTO SKYTR STRL (DRAIN) ×2 IMPLANT
BASKET LASER NITINOL 1.9FR (BASKET) ×2 IMPLANT
BASKET STNLS GEMINI 4WIRE 3FR (BASKET) IMPLANT
BASKET ZERO TIP NITINOL 2.4FR (BASKET) ×2 IMPLANT
CANISTER SUCT LVC 12 LTR MEDI- (MISCELLANEOUS) ×2 IMPLANT
CATH INTERMIT  6FR 70CM (CATHETERS) IMPLANT
CATH URET 5FR 28IN CONE TIP (BALLOONS)
CATH URET 5FR 28IN OPEN ENDED (CATHETERS) ×2 IMPLANT
CATH URET 5FR 70CM CONE TIP (BALLOONS) IMPLANT
CATH URET DUAL LUMEN 6-10FR 50 (CATHETERS) ×2 IMPLANT
CLOTH BEACON ORANGE TIMEOUT ST (SAFETY) ×2 IMPLANT
DRAPE CAMERA CLOSED 9X96 (DRAPES) ×2 IMPLANT
DRSG TEGADERM 2-3/8X2-3/4 SM (GAUZE/BANDAGES/DRESSINGS) IMPLANT
GLOVE BIO SURGEON STRL SZ7.5 (GLOVE) ×2 IMPLANT
GOWN STRL REIN XL XLG (GOWN DISPOSABLE) ×2 IMPLANT
GUIDEWIRE 0.038 PTFE COATED (WIRE) IMPLANT
GUIDEWIRE ANG ZIPWIRE 038X150 (WIRE) IMPLANT
GUIDEWIRE STR DUAL SENSOR (WIRE) ×4 IMPLANT
IV NS IRRIG 3000ML ARTHROMATIC (IV SOLUTION) ×4 IMPLANT
KIT BALLIN UROMAX 15FX10 (LABEL) IMPLANT
KIT BALLN UROMAX 15FX4 (MISCELLANEOUS) IMPLANT
KIT BALLN UROMAX 26 75X4 (MISCELLANEOUS)
LASER FIBER DISP (UROLOGICAL SUPPLIES) ×4 IMPLANT
NS IRRIG 500ML POUR BTL (IV SOLUTION) IMPLANT
PACK CYSTOSCOPY (CUSTOM PROCEDURE TRAY) ×2 IMPLANT
SET HIGH PRES BAL DIL (LABEL)
SHEATH ACCESS URETERAL 38CM (SHEATH) IMPLANT
SHEATH ACCESS URETERAL 54CM (SHEATH) ×2 IMPLANT
STENT URET 6FRX24 CONTOUR (STENTS) ×2 IMPLANT
TUBE FEEDING 8FR 16IN STR KANG (MISCELLANEOUS) ×2 IMPLANT

## 2012-12-13 NOTE — Interval H&P Note (Signed)
History and Physical Interval Note:  12/13/2012 1:15 PM  Lauren James  has presented today for surgery, with the diagnosis of Right Nephrolithiasis  The various methods of treatment have been discussed with the patient and family. After consideration of risks, benefits and other options for treatment, the patient has consented to  Procedure(s): CYSTOSCOPY/RIGHT URETERAL STENT EXCHANGE/RIGHT RETROGRADE PYELOGRAM/RIGHT URETERSCOPY/LASER LITHOTRIPSY (Right) URETEROSCOPY (Right) HOLMIUM LASER APPLICATION (N/A) as a surgical intervention .  The patient's history has been reviewed, patient examined, no change in status, stable for surgery.  I have reviewed the patient's chart and labs.  Questions were answered to the patient's satisfaction.     Berniece Salines W

## 2012-12-13 NOTE — Transfer of Care (Signed)
Immediate Anesthesia Transfer of Care Note  Patient: Lauren James  Procedure(s) Performed: Procedure(s): CYSTOSCOPY/RIGHT URETERAL STENT EXCHANGE/RIGHT RETROGRADE PYELOGRAM (Right) URETEROSCOPY (Right) HOLMIUM LASER APPLICATION (Right)  Patient Location: PACU  Anesthesia Type:General  Level of Consciousness: sedated  Airway & Oxygen Therapy: Patient Spontanous Breathing and Patient connected to nasal cannula oxygen  Post-op Assessment: Report given to PACU RN  Post vital signs: Reviewed and stable  Complications: No apparent anesthesia complications

## 2012-12-13 NOTE — Anesthesia Preprocedure Evaluation (Addendum)
Anesthesia Evaluation  Patient identified by MRN, date of birth, ID band Patient awake    Reviewed: Allergy & Precautions, H&P , NPO status , Patient's Chart, lab work & pertinent test results  Airway Mallampati: III TM Distance: <3 FB Neck ROM: Full    Dental no notable dental hx. (+) Dental Advisory Given   Pulmonary neg pulmonary ROS,  breath sounds clear to auscultation  Pulmonary exam normal       Cardiovascular negative cardio ROS  Rhythm:Regular Rate:Normal     Neuro/Psych negative neurological ROS  negative psych ROS   GI/Hepatic Neg liver ROS, GERD-  Medicated,  Endo/Other  diabetes, Type 2  Renal/GU negative Renal ROS     Musculoskeletal negative musculoskeletal ROS (+)   Abdominal   Peds  Hematology negative hematology ROS (+)   Anesthesia Other Findings   Reproductive/Obstetrics negative OB ROS                           Anesthesia Physical Anesthesia Plan  ASA: II  Anesthesia Plan: General   Post-op Pain Management:    Induction: Intravenous  Airway Management Planned: LMA  Additional Equipment:   Intra-op Plan:   Post-operative Plan: Extubation in OR  Informed Consent: I have reviewed the patients History and Physical, chart, labs and discussed the procedure including the risks, benefits and alternatives for the proposed anesthesia with the patient or authorized representative who has indicated his/her understanding and acceptance.   Dental advisory given  Plan Discussed with: CRNA  Anesthesia Plan Comments:         Anesthesia Quick Evaluation

## 2012-12-13 NOTE — Anesthesia Procedure Notes (Signed)
Procedure Name: LMA Insertion Date/Time: 12/13/2012 1:24 PM Performed by: Maris Berger T Pre-anesthesia Checklist: Patient identified, Emergency Drugs available, Suction available and Patient being monitored Patient Re-evaluated:Patient Re-evaluated prior to inductionOxygen Delivery Method: Circle System Utilized Preoxygenation: Pre-oxygenation with 100% oxygen Intubation Type: IV induction Ventilation: Mask ventilation without difficulty LMA: LMA inserted LMA Size: 4.0 Number of attempts: 1 Airway Equipment and Method: bite block Placement Confirmation: positive ETCO2 Dental Injury: Teeth and Oropharynx as per pre-operative assessment

## 2012-12-13 NOTE — Anesthesia Postprocedure Evaluation (Signed)
Anesthesia Post Note  Patient: Lauren James  Procedure(s) Performed: Procedure(s) (LRB): CYSTOSCOPY/RIGHT URETERAL STENT EXCHANGE/RIGHT RETROGRADE PYELOGRAM (Right) URETEROSCOPY (Right) HOLMIUM LASER APPLICATION (Right)  Anesthesia type: General  Patient location: PACU  Post pain: Pain level controlled  Post assessment: Post-op Vital signs reviewed  Last Vitals:  Filed Vitals:   12/13/12 1745  BP: 128/65  Pulse: 75  Temp:   Resp: 19    Post vital signs: Reviewed  Level of consciousness: sedated  Complications: No apparent anesthesia complications

## 2012-12-13 NOTE — H&P (View-Only) (Signed)
Active Problems Problems  1. Nephrolithiasis 592.0  History of Present Illness  This is a 62-year-old female who presents in follow-up after ED visit for right flank pain revealed a large proximal ureteral stone and a smaller distal stone with obstruction.  The patient states that her symptoms started 2 nights prior with some dysuria and urgency.  He subsequently developed right flank pain and nausea.  The pain has been gradually worsening.  Prior to presenting to the emergency department she was unable to comfortable.  On presentation to the ER the patient was afebrile with stable vital signs.  Urine analysis revealed white cells and red cells in her urine, but nitrite negative.  There are also calcium oxalate crystals.  No urine culture was obtained, but the patient was given a shot of Rocephin in the ED, and discharged on 500 mg of ciprofloxacin twice a day.  Her electrolytes were within normal limits, BUN was 15 and creatinine 0.71.  White blood cell count was 10, hemoglobin 15.4, and platelets 192.  CT scan noncontrast abdomen and pelvis reveals 2 right ureteral stones the largest in the proximal ureter measuring 10 x 5 x 14 mm.  Distal right ureteral stone measures 5-6 mm, which is just proximal to her UVJ.  There is no evidence of stones on the left side.  Patient was discharged home with ciprofloxacin 500 mg twice a day, Flomax 0.4 mg daily, and Vicodin.  In the interval her pain has been poorly controlled, she has been nauseous and had emesis several times.  She reports feeling febril, although has not taken her temperature.  She complains of urgency, dysuria, and the feeling of being unable to empty her bladder.  Here in the office the patient is diaphoretic, clearly uncomfortable, and did have an episode of emesis.  The patient has a history of kidney stones, and she has had extracorporal shockwave lithotripsy in the past.   Current Meds 1. Ciprofloxacin HCl 500 MG Oral Tablet;  Therapy: 02Sep2014 to 2. Hydrocodone-Acetaminophen 5-325 MG Oral Tablet; Therapy: 02Sep2014 to 3. Hyoscyamine Sulfate 0.125 MG Sublingual Tablet Sublingual; Therapy: 14Aug2014 to 4. Vitamin D (Ergocalciferol) 50000 UNIT Oral Capsule; Therapy: 17Oct2013 to  Review of Systems  A 12 point review of systems was obtained and is as stated in the HPI with the following additions: Patient endorses heartburn/indigestion, diarrhea, night sweats, fatigue, blurred vision, double vision, sinus problems, joint pain, headache, dizziness.     Physical Exam Constitutional: well developed . In acute distress.  ENT:. The ears and nose are normal in appearance.  Neck: The appearance of the neck is normal and no neck mass is present.  Pulmonary: No respiratory distress, normal respiratory rhythm and effort and clear bilateral breath sounds . No accessory muscle use.  Cardiovascular: Heart rate and rhythm are normal . The arterial pulses are normal. No peripheral edema. No obvious murmurs are appreciated.  Abdomen: The abdomen is soft and nontender. No suprapubic tenderness. No CVA tenderness.  Skin: Normal skin turgor, no visible rash and no visible skin lesions.  Neuro/Psych:. Mood and affect are appropriate.    Results/Data  I personally reviewed the patient's CAT scan which is dated November 25, 2012.  Please see the HPI for pertinent findings. Condition I reviewed the patient's lab values and all pertinent information is stated in the HPI.     Assessment  Acute right-sided renal colic with 2 obstructing stones, poorly controlled pain, subjective fevers, and unable to keep anything down.  I discussed the   patient's options and recommended that she have at least a stent placed today with a possible attempt of retrieving the lower stone fragment.  Further stone removal of her proximal stone will be addressed at a separate time.   Plan  #1 I given the patient 15 mg of IM Toradol here in the office #2 I  discussed the case with Dr. Ayomikun Starling who is on-call, I've added her the pending OR cases for this afternoon to include right retrograde pyelogram,  right ureteroscopy,  right laser lithotripsy, right ureteral stent placement.  I gone over the procedure in detail including the risks and benefits.  The patient understands that she'll have a stent placed afterwards and likely require additional procedures. #3 I have sent the patient to short stay unit for insertion of an IV, IV fluids, and more aggressive pain control.   Signatures Electronically signed by : Benjamin Herrick, M.D.; Nov 27 2012  1:38PM   

## 2012-12-13 NOTE — Op Note (Signed)
Preoperative diagnosis: right renal calculus  Postoperative diagnosis: right renal calculus  Procedure:  1. Cystoscopy 2. right ureteroscopy and stone removal 3. Ureteroscopic laser lithotripsy 4. right 66F x 24 cm ureteral stent placement  5. right retrograde pyelography with interpretation  Surgeon: Crist Fat, MD  Anesthesia: General  Complications: None  Intraoperative findings: right retrograde pyelography demonstrated a filling defect within the right lower pole consistent with the patient's known calculus without other abnormalities.  The stone was initially in the lower pole. Using the basket I moved into the upper pole prior to using the laser for fragmentation. The kidney stone had a very soft outer shell that fragmented into many small pieces. The inner core was harder and was fragmented into several smaller pieces to enable extraction.  EBL: Minimal  Specimens: 1. right ureteral calculus  Disposition of specimens: [facility] for stone analysis  Indication: Lauren James is a 62 y.o.   patient with urolithiasis. After reviewing the management options for treatment, the patient elected to proceed with the above surgical procedure(s). We have discussed the potential benefits and risks of the procedure, side effects of the proposed treatment, the likelihood of the patient achieving the goals of the procedure, and any potential problems that might occur during the procedure or recuperation. Informed consent has been obtained.  Description of procedure:  The patient was taken to the operating room and general anesthesia was induced.  The patient was placed in the dorsal lithotomy position, prepped and draped in the usual sterile fashion, and preoperative antibiotics were administered. A preoperative time-out was performed.   Cystourethroscopy was performed.  The patient's urethra was examined and was normal. The bladder was then systematically  examined in its entirety. There was no evidence for any bladder tumors, stones, or other mucosal pathology.    The stent emanating from the right ureteral orifice was then grasped and pulled to the urethral meatus.  A 0.38 sensor guidewire was then advanced up the through the stent under fluoroscopic guidance into the right renal pelvis. The stent was then removed over the wire. I then used a dual-lumen catheter and advanced it over the wire and into the proximal right ureter. We then performed a retrograde pyelogram with the above findings. I then advanced a second wire into the second lumen of the catheter and remove the catheter over the wire. I then coiled one wire and clamped to the drape. A second wire was used to advance a 12/14 [place] by 36 cm ureteral access sheath into the right ureter under fluoroscopic guidance. The wire was then removed and then the inner sheath removed. I then passed the digital ureteroscope through the access sheath and into the right renal pelvis. We then perform pyeloscopy and encountered the stone in the lower pole. Using a 0 tipped basket I grasped the stone and moved it to the upper pole.  As I unable to let go of the stone with the basket and had to cut the basket and removed the ureteroscope and then bringing the ureteroscope alongside the basket and dislodged the stone using the tip of the basket. I then was able to remove the basket it in its entirety. The stone was then fragmented with the 200 micron holmium laser fiber on a setting of 0.6 and frequency of 6-10 Hz. The outside layer of the stone fragmented into many small little crystal fragment, the inner core was much harder and fragmented into 4 or 5 smaller pieces.  All  stones were then removed from the ureter with a zero tip nitinol basket.  I then systematically inspected the renal pelvis with the assistance of fluoroscopy to ensure that all large stone fragments had been removed. There were smaller crystals that I  left behind and will pass eventually on her own. I then removed the flexible ureteroscope slowly backing out of the ureter and simultaneously pulling back the access sheath. There were several stones lodged behind the access sheath but which we were able to grasp with the 0 tip basket and remove. The ureter otherwise was unremarkable. The safety wire was then backloaded through the cystoscope and a ureteral stent was advance over the wire using Seldinger technique.  The stent was positioned appropriately under fluoroscopic and cystoscopic guidance.  The wire was then removed with an adequate stent curl noted in the renal pelvis as well as in the bladder. The string was left on the distal end of the stent and tucked into the patient's vagina.  The bladder was then emptied and the procedure ended.  The patient appeared to tolerate the procedure well and without complications.  The patient was able to be awakened and transferred to the recovery unit in satisfactory condition.   Disposition: The patient was extubated and returned to the PACU in excellent condition. The patient will remove her stent in 5 days. She's been given 2 antibiotics to take one hour before and 12 hours after stent removal.

## 2012-12-15 ENCOUNTER — Emergency Department (HOSPITAL_COMMUNITY)
Admission: EM | Admit: 2012-12-15 | Discharge: 2012-12-16 | Disposition: A | Discharge status: Home / Self Care | Payer: BC Managed Care – PPO | Attending: Emergency Medicine | Admitting: Emergency Medicine

## 2012-12-15 ENCOUNTER — Encounter (HOSPITAL_COMMUNITY): Payer: Self-pay | Admitting: *Deleted

## 2012-12-15 DIAGNOSIS — Z79899 Other long term (current) drug therapy: Secondary | ICD-10-CM | POA: Insufficient documentation

## 2012-12-15 DIAGNOSIS — Z792 Long term (current) use of antibiotics: Secondary | ICD-10-CM | POA: Insufficient documentation

## 2012-12-15 DIAGNOSIS — Z87442 Personal history of urinary calculi: Secondary | ICD-10-CM | POA: Insufficient documentation

## 2012-12-15 DIAGNOSIS — R112 Nausea with vomiting, unspecified: Secondary | ICD-10-CM | POA: Insufficient documentation

## 2012-12-15 DIAGNOSIS — Z8719 Personal history of other diseases of the digestive system: Secondary | ICD-10-CM | POA: Insufficient documentation

## 2012-12-15 DIAGNOSIS — N23 Unspecified renal colic: Secondary | ICD-10-CM | POA: Insufficient documentation

## 2012-12-15 LAB — URINE MICROSCOPIC-ADD ON

## 2012-12-15 LAB — URINALYSIS, ROUTINE W REFLEX MICROSCOPIC
Glucose, UA: NEGATIVE mg/dL
Nitrite: NEGATIVE
Protein, ur: >300 mg/dL — AB
Urobilinogen, UA: 0.2 mg/dL (ref 0.0–1.0)

## 2012-12-15 MED ORDER — ONDANSETRON HCL 4 MG/2ML IJ SOLN: 4.0000 mg | Freq: Once | INTRAMUSCULAR | Status: DC

## 2012-12-15 MED ORDER — HYDROMORPHONE HCL PF 1 MG/ML IJ SOLN
1.0000 mg | Freq: Once | INTRAMUSCULAR | Status: AC
  Administered 2012-12-16: 1 mg via INTRAVENOUS
  Filled 2012-12-15: qty 1

## 2012-12-15 MED ORDER — ONDANSETRON HCL 4 MG/2ML IJ SOLN
4.0000 mg | Freq: Once | INTRAMUSCULAR | Status: AC
  Administered 2012-12-15: 4 mg via INTRAVENOUS
  Filled 2012-12-15: qty 2

## 2012-12-15 MED ORDER — SODIUM CHLORIDE 0.9 % IV SOLN
Freq: Once | INTRAVENOUS | Status: AC
  Administered 2012-12-15: via INTRAVENOUS

## 2012-12-15 NOTE — ED Notes (Signed)
Presents with abd pain nausea, had kidney stone surgery Friday

## 2012-12-15 NOTE — ED Provider Notes (Signed)
CSN: [id number]     Arrival date & time 12/15/12  2239 History   None    Chief Complaint  Patient presents with  . Abdominal Pain  . Emesis   (Consider location/radiation/quality/duration/timing/severity/associated sxs/prior Treatment) Patient is a 62 y.o. female presenting with abdominal pain and vomiting. The history is provided by the patient.  Abdominal Pain Associated symptoms: vomiting   Emesis Associated symptoms: abdominal pain   She had surgery for kidney stone 2 days ago. She was doing well until this afternoon when she started having pain in the right lower abdomen. She thought that it might be better if she removed the stents that she took the stent out and pain actually did improve for about an hour but then got much worse. Pain is severe and she rates it at 9/10. There is some radiation to the right flank. Pain is sharp and is associated with nausea and vomiting. She denies any dysuria and denies any constipation or diarrhea. She's not had any fever or chills.  Past Medical History  Diagnosis Date  . History of kidney stones   . Right ureteral stone   . Prediabetes     DIET CONTROLLED  . GERD (gastroesophageal reflux disease)     WATCHES DIET  . Frequency of urination   . Urgency of urination   . Dysuria   . Hematuria   . Nocturia   . Wears glasses    Past Surgical History  Procedure Laterality Date  . Cystoscopy with ureteroscopy Right 11/27/2012    Procedure: CYSTOSCOPY WITH RIGHT  URETEROSCOPY AND STENT PLACEMENT ;  Surgeon: Crecencio Mc, MD;  Location: [facility];  Service: Urology;  Laterality: Right;  . Holmium laser application Right 11/27/2012    Procedure: HOLMIUM LASER APPLICATION;  Surgeon: Crecencio Mc, MD;  Location: [facility];  Service: Urology;  Laterality: Right;  . Extracorporeal shock wave lithotripsy  1994  (APPROX)  . Cholecystectomy  2000  (APPROX)   No family history on file. History  Substance Use Topics  . Smoking status: Never Smoker   . Smokeless  tobacco: Never Used  . Alcohol Use: No   OB History   Grav Para Term Preterm Abortions TAB SAB Ect Mult Living                 Review of Systems  Gastrointestinal: Positive for vomiting and abdominal pain.  All other systems reviewed and are negative.    Allergies  Adhesive  Home Medications   Current Outpatient Rx  Name  Route  Sig  Dispense  Refill  . ciprofloxacin (CIPRO) 500 MG tablet   Oral   Take 1 tablet (500 mg total) by mouth 2 (two) times daily. Start taking one hour prior to stent removal.   2 tablet   0   . HYDROcodone-acetaminophen (NORCO/VICODIN) 5-325 MG per tablet   Oral   Take 1-2 tablets by mouth every 6 (six) hours as needed for pain.   20 tablet   0   . hyoscyamine (LEVSIN, ANASPAZ) 0.125 MG tablet   Oral   Take 0.125 mg by mouth every 4 (four) hours as needed for cramping.         . promethazine (PHENERGAN) 25 MG tablet   Oral   Take 1 tablet (25 mg total) by mouth every 6 (six) hours as needed for nausea.   30 tablet   0   . solifenacin (VESICARE) 5 MG tablet   Oral   Take 5 mg by  mouth daily.         . Vitamin D, Ergocalciferol, (DRISDOL) 50000 UNITS CAPS capsule   Oral   Take 50,000 Units by mouth every 7 (seven) days.           BP 176/78  Pulse 66  Temp(Src) 97.5 F (36.4 C) (Oral)  SpO2 99% Physical Exam  Nursing note and vitals reviewed.  62 year old female, who is an obvious pain and actively retching, but he is in no acute respiratory distress. Vital signs are significant for hypertension with blood pressure 176/78. Oxygen saturation is 99%, which is normal. Head is normocephalic and atraumatic. PERRLA, EOMI. Oropharynx is clear. Neck is nontender and supple without adenopathy or JVD. Back is nontender in the midline. There is mild right CVA tenderness. Lungs are clear without rales, wheezes, or rhonchi. Chest is nontender. Heart has regular rate and rhythm without murmur. Abdomen is soft, flat, with some mild  tenderness in the right lower quadrant. There is no rebound or guarding. There are no masses or hepatosplenomegaly and peristalsis is hypoactive. Extremities have no cyanosis or edema, full range of motion is present. Skin is warm and dry without rash. Neurologic: Mental status is normal, cranial nerves are intact, there are no motor or sensory deficits.  ED Course  Procedures (including critical care time) Labs Review Results for orders placed during the hospital encounter of 12/15/12  COMPREHENSIVE METABOLIC PANEL      Result Value Range   Sodium 138  135 - 145 mEq/L   Potassium 3.8  3.5 - 5.1 mEq/L   Chloride 102  96 - 112 mEq/L   CO2 22  19 - 32 mEq/L   Glucose, Bld 220 (*) 70 - 99 mg/dL   BUN 13  6 - 23 mg/dL   Creatinine, Ser 1.61  0.50 - 1.10 mg/dL   Calcium 8.7  8.4 - 09.6 mg/dL   Total Protein 6.6  6.0 - 8.3 g/dL   Albumin 3.6  3.5 - 5.2 g/dL   AST 20  0 - 37 U/L   ALT 31  0 - 35 U/L   Alkaline Phosphatase 64  39 - 117 U/L   Total Bilirubin 0.3  0.3 - 1.2 mg/dL   GFR calc non Af Amer 88 (*) >90 mL/min   GFR calc Af Amer >90  >90 mL/min  URINALYSIS, ROUTINE W REFLEX MICROSCOPIC      Result Value Range   Color, Urine RED (*) YELLOW   APPearance TURBID (*) CLEAR   Specific Gravity, Urine 1.026  1.005 - 1.030   pH 5.0  5.0 - 8.0   Glucose, UA NEGATIVE  NEGATIVE mg/dL   Hgb urine dipstick LARGE (*) NEGATIVE   Bilirubin Urine NEGATIVE  NEGATIVE   Ketones, ur NEGATIVE  NEGATIVE mg/dL   Protein, ur >045 (*) NEGATIVE mg/dL   Urobilinogen, UA 0.2  0.0 - 1.0 mg/dL   Nitrite NEGATIVE  NEGATIVE   Leukocytes, UA MODERATE (*) NEGATIVE  CBC WITH DIFFERENTIAL      Result Value Range   WBC 13.1 (*) 4.0 - 10.5 K/uL   RBC 4.69  3.87 - 5.11 MIL/uL   Hemoglobin 13.7  12.0 - 15.0 g/dL   HCT 40.9  81.1 - 91.4 %   MCV 84.2  78.0 - 100.0 fL   MCH 29.2  26.0 - 34.0 pg   MCHC 34.7  30.0 - 36.0 g/dL   RDW 78.2  95.6 - 21.3 %   Platelets 215  150 - 400 K/uL   Neutrophils Relative % 68   43 - 77 %   Neutro Abs 8.9 (*) 1.7 - 7.7 K/uL   Lymphocytes Relative 24  12 - 46 %   Lymphs Abs 3.1  0.7 - 4.0 K/uL   Monocytes Relative 7  3 - 12 %   Monocytes Absolute 0.9  0.1 - 1.0 K/uL   Eosinophils Relative 1  0 - 5 %   Eosinophils Absolute 0.1  0.0 - 0.7 K/uL   Basophils Relative 0  0 - 1 %   Basophils Absolute 0.0  0.0 - 0.1 K/uL  URINE MICROSCOPIC-ADD ON      Result Value Range   Squamous Epithelial / LPF RARE  RARE   WBC, UA 11-20  <3 WBC/hpf   RBC / HPF TOO NUMEROUS TO COUNT  <3 RBC/hpf   Bacteria, UA RARE  RARE   Urine-Other MICROSCOPIC EXAM PERFORMED ON UNCONCENTRATED URINE     Imaging Review Ct Abdomen Pelvis Wo Contrast  12/16/2012   CLINICAL DATA:  Abdominal pain with emesis  EXAM: CT ABDOMEN AND PELVIS WITHOUT CONTRAST  TECHNIQUE: Multidetector CT imaging of the abdomen and pelvis was performed following the standard protocol without intravenous contrast.  COMPARISON:  11/25/2012  FINDINGS: Liver: Imaged portion show steatotic infiltration.  Biliary: Cholecystectomy.  Pancreas: Unremarkable.  Spleen: Imaged portions unremarkable.  Adrenals: Unremarkable.  Kidneys and ureters: Moderate right hydroureteronephrosis with perinephric and periureteral stranding. The proximal most ureteric stone is just beyond the UPJ, 3mm in diameter. In the distal ureter, there is a string of punctate stones, include a 2 mm stone at the ureteral vesicular junction. Probable small stones already layering in the right bladder. No left nephrolithiasis. There are still intra renal stones on the right, measuring up to 5 mm and 4 mm in the lower pole.  Bladder: Unremarkable.  Bowel: Colonic diverticulosis. Normal appendix.  Retroperitoneum: No mass or adenopathy.  Peritoneum: No free fluid or gas.  Reproductive: Multi lobulated and enlarged uterus, consistent with fibroids.  Vascular: No acute abnormality.  OSSEOUS: No acute abnormalities. No suspicious lytic or blastic lesions.  IMPRESSION: 1. Right  obstructive uropathy with numerous right ureteral calculi, up to 3 mm diameter, located both proximally and distally. 2. Right nephrolithiasis, up to 5 mm in the lower pole.   Electronically Signed   By: Tiburcio Pea   On: 12/16/2012 01:40    MDM  No diagnosis found. Right lower quadrant pain which seems to be related to recent surgery for renal calculus. I suspect the residual ureteral calculi which are now capable of obstructing her systems and she pulled her stent out. CT scan will be repeated. She's given IV fluids, IV hydromorphone, and IV ondansetron. Old records are reviewed and she had laser lithotripsy done 2 days ago.  She got good relief of pain with IV hydromorphone. CT shows multiple small calculi in the distal ureter-Aldrich size which should pass without difficulty. Patient does not have any pain medication at home. She had some leftover pain medication from her prior procedure never got her prescription for hydrocodone-acetaminophen filled. She is advised to get the prescription filled and she is given a prescription for ondansetron for nausea and tamsulosin to assist in passing ureteral calculi. She is to followup with her urologist  Dione Booze, MD 12/16/12 [phone]

## 2012-12-15 NOTE — ED Notes (Signed)
Pt aware urine sample is needed 

## 2012-12-16 ENCOUNTER — Encounter (HOSPITAL_BASED_OUTPATIENT_CLINIC_OR_DEPARTMENT_OTHER): Payer: Self-pay | Admitting: Urology

## 2012-12-16 ENCOUNTER — Emergency Department (HOSPITAL_COMMUNITY): Payer: BC Managed Care – PPO

## 2012-12-16 LAB — CBC WITH DIFFERENTIAL/PLATELET
Basophils Absolute: 0 10*3/uL (ref 0.0–0.1)
Lymphocytes Relative: 24 % (ref 12–46)
Neutro Abs: 8.9 10*3/uL — ABNORMAL HIGH (ref 1.7–7.7)
Neutrophils Relative %: 68 % (ref 43–77)
Platelets: 215 10*3/uL (ref 150–400)
RDW: 12.9 % (ref 11.5–15.5)
WBC: 13.1 10*3/uL — ABNORMAL HIGH (ref 4.0–10.5)

## 2012-12-16 LAB — COMPREHENSIVE METABOLIC PANEL
Albumin: 3.6 g/dL (ref 3.5–5.2)
BUN: 13 mg/dL (ref 6–23)
Chloride: 102 mEq/L (ref 96–112)
Creatinine, Ser: 0.78 mg/dL (ref 0.50–1.10)
GFR calc non Af Amer: 88 mL/min — ABNORMAL LOW (ref >90)
Total Bilirubin: 0.3 mg/dL (ref 0.3–1.2)

## 2012-12-16 MED ORDER — TAMSULOSIN HCL 0.4 MG PO CAPS: 0.4000 mg | ORAL_CAPSULE | Freq: Every day | ORAL | Status: DC

## 2012-12-16 MED ORDER — ONDANSETRON HCL 4 MG PO TABS: 4.0000 mg | ORAL_TABLET | Freq: Four times a day (QID) | ORAL | Status: DC | PRN

## 2015-04-22 ENCOUNTER — Other Ambulatory Visit: Payer: Self-pay | Admitting: Family Medicine

## 2015-04-22 ENCOUNTER — Other Ambulatory Visit (HOSPITAL_COMMUNITY)
Admission: RE | Admit: 2015-04-22 | Discharge: 2015-04-22 | Disposition: A | Discharge status: Home / Self Care | Payer: PPO | Source: Ambulatory Visit | Attending: Family Medicine | Admitting: Family Medicine

## 2015-04-22 DIAGNOSIS — E559 Vitamin D deficiency, unspecified: Secondary | ICD-10-CM | POA: Diagnosis not present

## 2015-04-22 DIAGNOSIS — M8580 Other specified disorders of bone density and structure, unspecified site: Secondary | ICD-10-CM | POA: Diagnosis not present

## 2015-04-22 DIAGNOSIS — E785 Hyperlipidemia, unspecified: Secondary | ICD-10-CM | POA: Diagnosis not present

## 2015-04-22 DIAGNOSIS — Z0000 Encounter for general adult medical examination without abnormal findings: Secondary | ICD-10-CM | POA: Diagnosis not present

## 2015-04-22 DIAGNOSIS — Z7984 Long term (current) use of oral hypoglycemic drugs: Secondary | ICD-10-CM | POA: Diagnosis not present

## 2015-04-22 DIAGNOSIS — R197 Diarrhea, unspecified: Secondary | ICD-10-CM | POA: Diagnosis not present

## 2015-04-22 DIAGNOSIS — Z23 Encounter for immunization: Secondary | ICD-10-CM | POA: Diagnosis not present

## 2015-04-22 DIAGNOSIS — N390 Urinary tract infection, site not specified: Secondary | ICD-10-CM | POA: Diagnosis not present

## 2015-04-22 DIAGNOSIS — E119 Type 2 diabetes mellitus without complications: Secondary | ICD-10-CM | POA: Diagnosis not present

## 2015-04-22 DIAGNOSIS — Z124 Encounter for screening for malignant neoplasm of cervix: Secondary | ICD-10-CM | POA: Diagnosis not present

## 2015-04-22 DIAGNOSIS — R109 Unspecified abdominal pain: Secondary | ICD-10-CM | POA: Diagnosis not present

## 2015-04-26 LAB — CYTOLOGY - PAP

## 2015-05-15 DIAGNOSIS — W010XXA Fall on same level from slipping, tripping and stumbling without subsequent striking against object, initial encounter: Secondary | ICD-10-CM | POA: Diagnosis not present

## 2015-05-15 DIAGNOSIS — M79622 Pain in left upper arm: Secondary | ICD-10-CM | POA: Diagnosis not present

## 2015-05-15 DIAGNOSIS — M25532 Pain in left wrist: Secondary | ICD-10-CM | POA: Diagnosis not present

## 2015-05-15 DIAGNOSIS — M79602 Pain in left arm: Secondary | ICD-10-CM | POA: Diagnosis not present

## 2015-05-15 DIAGNOSIS — M79632 Pain in left forearm: Secondary | ICD-10-CM | POA: Diagnosis not present

## 2015-05-15 DIAGNOSIS — E119 Type 2 diabetes mellitus without complications: Secondary | ICD-10-CM | POA: Diagnosis not present

## 2015-05-15 DIAGNOSIS — S43492A Other sprain of left shoulder joint, initial encounter: Secondary | ICD-10-CM | POA: Diagnosis not present

## 2015-05-15 DIAGNOSIS — M25522 Pain in left elbow: Secondary | ICD-10-CM | POA: Diagnosis not present

## 2015-05-15 DIAGNOSIS — R51 Headache: Secondary | ICD-10-CM | POA: Diagnosis not present

## 2015-05-15 DIAGNOSIS — S134XXA Sprain of ligaments of cervical spine, initial encounter: Secondary | ICD-10-CM | POA: Diagnosis not present

## 2015-05-15 DIAGNOSIS — M25512 Pain in left shoulder: Secondary | ICD-10-CM | POA: Diagnosis not present

## 2015-05-15 DIAGNOSIS — S0990XA Unspecified injury of head, initial encounter: Secondary | ICD-10-CM | POA: Diagnosis not present

## 2015-05-15 DIAGNOSIS — S161XXA Strain of muscle, fascia and tendon at neck level, initial encounter: Secondary | ICD-10-CM | POA: Diagnosis not present

## 2015-05-15 DIAGNOSIS — S0181XA Laceration without foreign body of other part of head, initial encounter: Secondary | ICD-10-CM | POA: Diagnosis not present

## 2015-05-15 DIAGNOSIS — M542 Cervicalgia: Secondary | ICD-10-CM | POA: Diagnosis not present

## 2015-05-21 DIAGNOSIS — A09 Infectious gastroenteritis and colitis, unspecified: Secondary | ICD-10-CM | POA: Diagnosis not present

## 2015-05-21 DIAGNOSIS — Z86010 Personal history of colonic polyps: Secondary | ICD-10-CM | POA: Diagnosis not present

## 2015-05-24 DIAGNOSIS — L989 Disorder of the skin and subcutaneous tissue, unspecified: Secondary | ICD-10-CM | POA: Diagnosis not present

## 2015-05-24 DIAGNOSIS — S0101XD Laceration without foreign body of scalp, subsequent encounter: Secondary | ICD-10-CM | POA: Diagnosis not present

## 2015-06-01 ENCOUNTER — Encounter: Payer: Self-pay | Admitting: Family Medicine

## 2015-06-11 DIAGNOSIS — L570 Actinic keratosis: Secondary | ICD-10-CM | POA: Diagnosis not present

## 2015-06-11 DIAGNOSIS — X32XXXA Exposure to sunlight, initial encounter: Secondary | ICD-10-CM | POA: Diagnosis not present

## 2015-06-11 DIAGNOSIS — L7211 Pilar cyst: Secondary | ICD-10-CM | POA: Diagnosis not present

## 2015-07-15 DIAGNOSIS — L570 Actinic keratosis: Secondary | ICD-10-CM | POA: Diagnosis not present

## 2015-07-15 DIAGNOSIS — X32XXXD Exposure to sunlight, subsequent encounter: Secondary | ICD-10-CM | POA: Diagnosis not present

## 2015-07-15 DIAGNOSIS — L219 Seborrheic dermatitis, unspecified: Secondary | ICD-10-CM | POA: Diagnosis not present

## 2015-07-15 DIAGNOSIS — L7211 Pilar cyst: Secondary | ICD-10-CM | POA: Diagnosis not present

## 2015-07-15 DIAGNOSIS — M859 Disorder of bone density and structure, unspecified: Secondary | ICD-10-CM | POA: Diagnosis not present

## 2015-07-15 DIAGNOSIS — Z780 Asymptomatic menopausal state: Secondary | ICD-10-CM | POA: Diagnosis not present

## 2015-11-01 DIAGNOSIS — E1121 Type 2 diabetes mellitus with diabetic nephropathy: Secondary | ICD-10-CM | POA: Diagnosis not present

## 2015-11-01 DIAGNOSIS — N181 Chronic kidney disease, stage 1: Secondary | ICD-10-CM | POA: Diagnosis not present

## 2015-11-01 DIAGNOSIS — R109 Unspecified abdominal pain: Secondary | ICD-10-CM | POA: Diagnosis not present

## 2015-11-01 DIAGNOSIS — E559 Vitamin D deficiency, unspecified: Secondary | ICD-10-CM | POA: Diagnosis not present

## 2015-11-02 ENCOUNTER — Other Ambulatory Visit: Payer: Self-pay | Admitting: Family Medicine

## 2015-11-02 ENCOUNTER — Other Ambulatory Visit: Payer: Self-pay | Admitting: Gastroenterology

## 2015-11-02 DIAGNOSIS — Z86010 Personal history of colonic polyps: Secondary | ICD-10-CM | POA: Diagnosis not present

## 2015-11-02 DIAGNOSIS — K635 Polyp of colon: Secondary | ICD-10-CM | POA: Diagnosis not present

## 2015-11-02 DIAGNOSIS — K5730 Diverticulosis of large intestine without perforation or abscess without bleeding: Secondary | ICD-10-CM | POA: Diagnosis not present

## 2015-11-02 DIAGNOSIS — D125 Benign neoplasm of sigmoid colon: Secondary | ICD-10-CM | POA: Diagnosis not present

## 2015-11-02 DIAGNOSIS — R109 Unspecified abdominal pain: Secondary | ICD-10-CM

## 2015-11-04 ENCOUNTER — Ambulatory Visit
Admission: RE | Admit: 2015-11-04 | Discharge: 2015-11-04 | Disposition: A | Discharge status: Home / Self Care | Payer: PPO | Source: Ambulatory Visit | Attending: Family Medicine | Admitting: Family Medicine

## 2015-11-04 DIAGNOSIS — R109 Unspecified abdominal pain: Secondary | ICD-10-CM

## 2015-12-19 DIAGNOSIS — R5383 Other fatigue: Secondary | ICD-10-CM | POA: Diagnosis not present

## 2015-12-19 DIAGNOSIS — N390 Urinary tract infection, site not specified: Secondary | ICD-10-CM | POA: Diagnosis not present

## 2015-12-19 DIAGNOSIS — N3001 Acute cystitis with hematuria: Secondary | ICD-10-CM | POA: Diagnosis not present

## 2015-12-19 DIAGNOSIS — R42 Dizziness and giddiness: Secondary | ICD-10-CM | POA: Diagnosis not present

## 2016-01-26 DIAGNOSIS — J0100 Acute maxillary sinusitis, unspecified: Secondary | ICD-10-CM | POA: Diagnosis not present

## 2016-01-26 DIAGNOSIS — J029 Acute pharyngitis, unspecified: Secondary | ICD-10-CM | POA: Diagnosis not present

## 2016-01-26 DIAGNOSIS — J209 Acute bronchitis, unspecified: Secondary | ICD-10-CM | POA: Diagnosis not present

## 2016-02-10 DIAGNOSIS — L570 Actinic keratosis: Secondary | ICD-10-CM | POA: Diagnosis not present

## 2016-02-10 DIAGNOSIS — X32XXXD Exposure to sunlight, subsequent encounter: Secondary | ICD-10-CM | POA: Diagnosis not present

## 2016-05-31 DIAGNOSIS — E1121 Type 2 diabetes mellitus with diabetic nephropathy: Secondary | ICD-10-CM | POA: Diagnosis not present

## 2016-05-31 DIAGNOSIS — Z7984 Long term (current) use of oral hypoglycemic drugs: Secondary | ICD-10-CM | POA: Diagnosis not present

## 2016-05-31 DIAGNOSIS — L299 Pruritus, unspecified: Secondary | ICD-10-CM | POA: Diagnosis not present

## 2016-05-31 DIAGNOSIS — R1011 Right upper quadrant pain: Secondary | ICD-10-CM | POA: Diagnosis not present

## 2016-05-31 DIAGNOSIS — Z23 Encounter for immunization: Secondary | ICD-10-CM | POA: Diagnosis not present

## 2016-05-31 DIAGNOSIS — Z0000 Encounter for general adult medical examination without abnormal findings: Secondary | ICD-10-CM | POA: Diagnosis not present

## 2016-05-31 DIAGNOSIS — E785 Hyperlipidemia, unspecified: Secondary | ICD-10-CM | POA: Diagnosis not present

## 2016-05-31 DIAGNOSIS — E559 Vitamin D deficiency, unspecified: Secondary | ICD-10-CM | POA: Diagnosis not present

## 2016-05-31 DIAGNOSIS — Z1389 Encounter for screening for other disorder: Secondary | ICD-10-CM | POA: Diagnosis not present

## 2016-05-31 DIAGNOSIS — N181 Chronic kidney disease, stage 1: Secondary | ICD-10-CM | POA: Diagnosis not present

## 2016-06-01 ENCOUNTER — Other Ambulatory Visit: Payer: Self-pay | Admitting: Family Medicine

## 2016-06-01 DIAGNOSIS — R1011 Right upper quadrant pain: Secondary | ICD-10-CM

## 2016-06-12 ENCOUNTER — Ambulatory Visit
Admission: RE | Admit: 2016-06-12 | Discharge: 2016-06-12 | Disposition: A | Discharge status: Home / Self Care | Payer: PPO | Source: Ambulatory Visit | Attending: Family Medicine | Admitting: Family Medicine

## 2016-06-12 DIAGNOSIS — K760 Fatty (change of) liver, not elsewhere classified: Secondary | ICD-10-CM | POA: Diagnosis not present

## 2016-06-12 DIAGNOSIS — R1011 Right upper quadrant pain: Secondary | ICD-10-CM

## 2016-07-24 DIAGNOSIS — E785 Hyperlipidemia, unspecified: Secondary | ICD-10-CM | POA: Diagnosis not present

## 2016-07-24 DIAGNOSIS — Z79899 Other long term (current) drug therapy: Secondary | ICD-10-CM | POA: Diagnosis not present

## 2016-07-24 DIAGNOSIS — Z23 Encounter for immunization: Secondary | ICD-10-CM | POA: Diagnosis not present

## 2016-08-10 DIAGNOSIS — L570 Actinic keratosis: Secondary | ICD-10-CM | POA: Diagnosis not present

## 2016-08-10 DIAGNOSIS — X32XXXD Exposure to sunlight, subsequent encounter: Secondary | ICD-10-CM | POA: Diagnosis not present

## 2016-12-04 DIAGNOSIS — Z7984 Long term (current) use of oral hypoglycemic drugs: Secondary | ICD-10-CM | POA: Diagnosis not present

## 2016-12-04 DIAGNOSIS — Z1231 Encounter for screening mammogram for malignant neoplasm of breast: Secondary | ICD-10-CM | POA: Diagnosis not present

## 2016-12-04 DIAGNOSIS — Z23 Encounter for immunization: Secondary | ICD-10-CM | POA: Diagnosis not present

## 2016-12-04 DIAGNOSIS — M549 Dorsalgia, unspecified: Secondary | ICD-10-CM | POA: Diagnosis not present

## 2016-12-04 DIAGNOSIS — Z79899 Other long term (current) drug therapy: Secondary | ICD-10-CM | POA: Diagnosis not present

## 2016-12-04 DIAGNOSIS — M722 Plantar fascial fibromatosis: Secondary | ICD-10-CM | POA: Diagnosis not present

## 2016-12-04 DIAGNOSIS — E785 Hyperlipidemia, unspecified: Secondary | ICD-10-CM | POA: Diagnosis not present

## 2016-12-04 DIAGNOSIS — R079 Chest pain, unspecified: Secondary | ICD-10-CM | POA: Diagnosis not present

## 2016-12-04 DIAGNOSIS — E119 Type 2 diabetes mellitus without complications: Secondary | ICD-10-CM | POA: Diagnosis not present

## 2016-12-04 DIAGNOSIS — J3089 Other allergic rhinitis: Secondary | ICD-10-CM | POA: Diagnosis not present

## 2016-12-06 ENCOUNTER — Other Ambulatory Visit: Payer: Self-pay | Admitting: Family Medicine

## 2016-12-06 DIAGNOSIS — Z1231 Encounter for screening mammogram for malignant neoplasm of breast: Secondary | ICD-10-CM

## 2016-12-19 ENCOUNTER — Ambulatory Visit
Admission: RE | Admit: 2016-12-19 | Discharge: 2016-12-19 | Disposition: A | Discharge status: Home / Self Care | Payer: PPO | Source: Ambulatory Visit | Attending: Family Medicine | Admitting: Family Medicine

## 2016-12-19 DIAGNOSIS — Z1231 Encounter for screening mammogram for malignant neoplasm of breast: Secondary | ICD-10-CM | POA: Diagnosis not present

## 2016-12-20 ENCOUNTER — Other Ambulatory Visit: Payer: Self-pay | Admitting: Family Medicine

## 2016-12-20 DIAGNOSIS — R928 Other abnormal and inconclusive findings on diagnostic imaging of breast: Secondary | ICD-10-CM

## 2016-12-26 ENCOUNTER — Ambulatory Visit
Admission: RE | Admit: 2016-12-26 | Discharge: 2016-12-26 | Disposition: A | Discharge status: Home / Self Care | Payer: PPO | Source: Ambulatory Visit | Attending: Family Medicine | Admitting: Family Medicine

## 2016-12-26 DIAGNOSIS — R928 Other abnormal and inconclusive findings on diagnostic imaging of breast: Secondary | ICD-10-CM

## 2016-12-26 DIAGNOSIS — N6489 Other specified disorders of breast: Secondary | ICD-10-CM | POA: Diagnosis not present

## 2017-02-14 DIAGNOSIS — Z79899 Other long term (current) drug therapy: Secondary | ICD-10-CM | POA: Diagnosis not present

## 2017-02-14 DIAGNOSIS — E785 Hyperlipidemia, unspecified: Secondary | ICD-10-CM | POA: Diagnosis not present

## 2017-02-20 ENCOUNTER — Other Ambulatory Visit: Payer: Self-pay | Admitting: Family Medicine

## 2017-02-20 ENCOUNTER — Ambulatory Visit
Admission: RE | Admit: 2017-02-20 | Discharge: 2017-02-20 | Disposition: A | Discharge status: Home / Self Care | Payer: PPO | Source: Ambulatory Visit | Attending: Family Medicine | Admitting: Family Medicine

## 2017-02-20 DIAGNOSIS — R350 Frequency of micturition: Secondary | ICD-10-CM | POA: Diagnosis not present

## 2017-02-20 DIAGNOSIS — M546 Pain in thoracic spine: Secondary | ICD-10-CM

## 2017-02-20 DIAGNOSIS — E785 Hyperlipidemia, unspecified: Secondary | ICD-10-CM | POA: Diagnosis not present

## 2017-02-20 DIAGNOSIS — R0609 Other forms of dyspnea: Principal | ICD-10-CM

## 2017-02-20 DIAGNOSIS — R110 Nausea: Secondary | ICD-10-CM | POA: Diagnosis not present

## 2017-02-20 DIAGNOSIS — E559 Vitamin D deficiency, unspecified: Secondary | ICD-10-CM | POA: Diagnosis not present

## 2017-02-20 DIAGNOSIS — R5382 Chronic fatigue, unspecified: Secondary | ICD-10-CM | POA: Diagnosis not present

## 2017-02-20 DIAGNOSIS — R0600 Dyspnea, unspecified: Secondary | ICD-10-CM

## 2017-02-20 DIAGNOSIS — R0602 Shortness of breath: Secondary | ICD-10-CM | POA: Diagnosis not present

## 2017-02-20 DIAGNOSIS — M2550 Pain in unspecified joint: Secondary | ICD-10-CM | POA: Diagnosis not present

## 2017-02-20 DIAGNOSIS — E119 Type 2 diabetes mellitus without complications: Secondary | ICD-10-CM | POA: Diagnosis not present

## 2017-02-20 DIAGNOSIS — R079 Chest pain, unspecified: Secondary | ICD-10-CM | POA: Diagnosis not present

## 2017-03-12 ENCOUNTER — Telehealth: Payer: Self-pay | Admitting: *Deleted

## 2017-03-12 NOTE — Telephone Encounter (Signed)
Referral request sent to scheduling

## 2017-04-02 DIAGNOSIS — E119 Type 2 diabetes mellitus without complications: Secondary | ICD-10-CM | POA: Diagnosis not present

## 2017-04-02 DIAGNOSIS — M549 Dorsalgia, unspecified: Secondary | ICD-10-CM | POA: Diagnosis not present

## 2017-04-06 ENCOUNTER — Other Ambulatory Visit: Payer: Self-pay | Admitting: Family Medicine

## 2017-04-06 DIAGNOSIS — M549 Dorsalgia, unspecified: Secondary | ICD-10-CM

## 2017-04-09 NOTE — Progress Notes (Signed)
Cardiology Office Note   Date:  04/12/2017   ID:  Lauren James, DOB 1951-02-04, MRN [id number]  PCP:  Lauren Stains, MD  Cardiologist:   Lauren Latch, MD   No chief complaint on file.    History of Present Illness: Lauren James is a 67 y.o. female with hyperlipidemia and diabeteswho is being seen today for the evaluation of back pain and shortness of breath at the request of Lauren Stains, MD.  She saw Lauren James on 02/20/17 and reported two months of dizziness.  She also reports occasional chest pain that she thinks is related to indigestion.  She doesn't get much exercise due to back pain and exhaustion.  She gets extremely exhausted whenever she tries to exercise.  Her back pain has been getting worse lately.  It happens with at rest and with exertion.  She has exertional dyspnea especially when going up the stairs.  She denies lower extremity edema, orthopnea, or PND.  She has been experiencing some heart fluttering for the last week.  Each episode lasts for a few seconds and gets better when she coughs.  There is no associated lightheadedness or shortness of breath.     Past Medical History:  Diagnosis Date  . Atypical chest pain 04/12/2017  . Diabetes mellitus type 2 in obese ([place]) 04/12/2017  . Dysuria   . Frequency of urination   . GERD (gastroesophageal reflux disease)    WATCHES DIET  . Hematuria   . History of kidney stones   . Hyperlipidemia 04/12/2017  . Nocturia   . Prediabetes    DIET CONTROLLED  . Right ureteral stone   . Urgency of urination   . Wears glasses     Past Surgical History:  Procedure Laterality Date  . CHOLECYSTECTOMY  2000  (APPROX)  . CYSTOSCOPY W/ URETERAL STENT PLACEMENT Right 12/13/2012   Procedure: CYSTOSCOPY/RIGHT URETERAL STENT EXCHANGE/RIGHT RETROGRADE PYELOGRAM;  Surgeon: [name], MD;  Location: [facility];  Service: Urology;  Laterality: Right;  . CYSTOSCOPY WITH URETEROSCOPY Right 11/27/2012   Procedure: CYSTOSCOPY WITH RIGHT  URETEROSCOPY AND STENT PLACEMENT ;  Surgeon: [name], MD;  Location: [facility];  Service: Urology;  Laterality: Right;  . [place]  (APPROX)  . HOLMIUM LASER APPLICATION Right 04/28/1939   Procedure: HOLMIUM LASER APPLICATION;  Surgeon: [name], MD;  Location: [facility];  Service: Urology;  Laterality: Right;  . HOLMIUM LASER APPLICATION Right 7/40/8144   Procedure: HOLMIUM LASER APPLICATION;  Surgeon: [name], MD;  Location: [facility];  Service: Urology;  Laterality: Right;  . URETEROSCOPY Right 12/13/2012   Procedure: URETEROSCOPY;  Surgeon: [name], MD;  Location: [facility];  Service: Urology;  Laterality: Right;     Current Outpatient Medications  Medication Sig Dispense Refill  . JARDIANCE 25 MG TABS tablet Take 25 mg by mouth daily.  2  . Omega-3 Fatty Acids (OMEGA-3 FISH OIL PO) Take 1 capsule by mouth daily. Omega  Red Fish Oil    . rosuvastatin (CRESTOR) 5 MG tablet Take 5 mg by mouth every evening.  1  . tiZANidine (ZANAFLEX) 2 MG tablet Take 2 mg by mouth daily as needed.  1   No current facility-administered medications for this visit.     Allergies:   Adhesive [tape]    Social History:  The patient  reports that  has never smoked. she has never used smokeless tobacco. She  reports that she does not drink alcohol or use drugs.   Family History:  The patient's family history includes Alcoholism in her paternal grandfather; Dementia in her father; Diabetes in her maternal grandmother, mother, sister, and sister; Heart attack in her maternal grandfather and sister; Heart disease in her mother; Kidney disease in her mother; Osteoporosis in her sister.    ROS:  Please see the history of present illness.   Otherwise, review of systems are positive for none.   All other systems are reviewed and negative.    PHYSICAL EXAM: VS:  BP 124/66   Pulse 80   Ht 5\' 4"   (1.626 m)   Wt 174 lb (78.9 kg)   SpO2 96%   BMI 29.87 kg/m  , BMI Body mass index is 29.87 kg/m. GENERAL:  Well appearing HEENT:  Pupils equal round and reactive, fundi not visualized, oral mucosa unremarkable NECK:  No jugular venous distention, waveform within normal limits, carotid upstroke brisk and symmetric, no bruits, no thyromegaly LYMPHATICS:  No cervical adenopathy LUNGS:  Clear to auscultation bilaterally HEART:  RRR.  PMI not displaced or sustained,S1 and S2 within normal limits, no S3, no S4, no clicks, no rubs, no murmurs ABD:  Flat, positive bowel sounds normal in frequency in pitch, no bruits, no rebound, no guarding, no midline pulsatile mass, no hepatomegaly, no splenomegaly EXT:  2 plus pulses throughout, no edema, no cyanosis no clubbing SKIN:  No rashes no nodules NEURO:  Cranial nerves II through XII grossly intact, motor grossly intact throughout PSYCH:  Cognitively intact, oriented to person place and time   EKG:  EKG is not ordered today.   Recent Labs: No results found for requested labs within last 8760 hours.   02/20/17: TSH 2.24 Sodium 138, potassium 3.9, BUN 11, creatinine 0.62 WBC 8.1, hemoglobin 14.1, hematocrit 42, platelets 229 AST 37, ALT 34  Lipid Panel No results found for: CHOL, TRIG, HDL, CHOLHDL, VLDL, LDLCALC, LDLDIRECT    Wt Readings from Last 3 Encounters:  04/12/17 174 lb (78.9 kg)  04/10/17 174 lb (78.9 kg)  12/13/12 181 lb (82.1 kg)      ASSESSMENT AND PLAN:  # Atypical back pain: # Exertional dyspnea:  It seems that Ms. Fregia' symptoms are more related to physical inactivity than ischemia.  She has back pain and not chest pain.  Her symptoms do not worsen with exertion, though she is not very physically active.  She has risk factors for cardiovascular disease including obesity, diabetes, and hyperlipidemia.  We will get a Lexiscan Myoview to ensure that this is not atypical ischemia.  # Hyperlipidemia: Managed by her  PCP.  Continue rosuvastatin.  Current medicines are reviewed at length with the patient today.  The patient does not have concerns regarding medicines.  The following changes have been made:  no change  Labs/ tests ordered today include:   Orders Placed This Encounter  Procedures  . MYOCARDIAL PERFUSION IMAGING     Disposition:   FU with Steph Cheadle C. Oval Linsey, MD, [facility] as needed.     This note was written with the assistance of speech recognition software.  Please excuse any transcriptional errors.  Signed, Bowie Delia C. Oval Linsey, MD, [facility]  04/12/2017 9:11 PM    [facility]

## 2017-04-10 ENCOUNTER — Encounter: Payer: Self-pay | Admitting: Cardiovascular Disease

## 2017-04-10 ENCOUNTER — Telehealth (HOSPITAL_COMMUNITY): Payer: Self-pay

## 2017-04-10 ENCOUNTER — Ambulatory Visit: Payer: PPO | Admitting: Cardiovascular Disease

## 2017-04-10 VITALS — BP 124/66 | HR 80 | Ht 64.0 in | Wt 174.0 lb

## 2017-04-10 DIAGNOSIS — E669 Obesity, unspecified: Secondary | ICD-10-CM | POA: Diagnosis not present

## 2017-04-10 DIAGNOSIS — R0789 Other chest pain: Secondary | ICD-10-CM

## 2017-04-10 DIAGNOSIS — E7800 Pure hypercholesterolemia, unspecified: Secondary | ICD-10-CM

## 2017-04-10 DIAGNOSIS — E1169 Type 2 diabetes mellitus with other specified complication: Secondary | ICD-10-CM

## 2017-04-10 NOTE — Patient Instructions (Signed)
Medication Instructions:  Your physician recommends that you continue on your current medications as directed. Please refer to the Current Medication list given to you today.  Labwork: none  Testing/Procedures: Your physician has requested that you have a lexiscan myoview. For further information please visit [email or web address]. Please follow instruction sheet, as given.  Follow-Up: As needed

## 2017-04-10 NOTE — Telephone Encounter (Signed)
Encounter complete. 

## 2017-04-12 ENCOUNTER — Ambulatory Visit (HOSPITAL_COMMUNITY)
Admission: RE | Admit: 2017-04-12 | Discharge: 2017-04-12 | Disposition: A | Discharge status: Home / Self Care | Payer: PPO | Source: Ambulatory Visit | Attending: Cardiology | Admitting: Cardiology

## 2017-04-12 ENCOUNTER — Encounter: Payer: Self-pay | Admitting: Cardiovascular Disease

## 2017-04-12 DIAGNOSIS — E1169 Type 2 diabetes mellitus with other specified complication: Secondary | ICD-10-CM | POA: Insufficient documentation

## 2017-04-12 DIAGNOSIS — E785 Hyperlipidemia, unspecified: Secondary | ICD-10-CM | POA: Insufficient documentation

## 2017-04-12 DIAGNOSIS — E669 Obesity, unspecified: Secondary | ICD-10-CM

## 2017-04-12 DIAGNOSIS — R0789 Other chest pain: Secondary | ICD-10-CM | POA: Insufficient documentation

## 2017-04-12 HISTORY — DX: Hyperlipidemia, unspecified: E78.5

## 2017-04-12 HISTORY — DX: Other chest pain: R07.89

## 2017-04-12 HISTORY — DX: Type 2 diabetes mellitus with other specified complication: E66.9

## 2017-04-12 HISTORY — DX: Type 2 diabetes mellitus with other specified complication: E11.69

## 2017-04-12 LAB — MYOCARDIAL PERFUSION IMAGING
CHL CUP NUCLEAR SDS: 2
CHL CUP RESTING HR STRESS: 61 {beats}/min
LV dias vol: 48 mL (ref 46–106)
LV sys vol: 13 mL
NUC STRESS TID: 1.09
Peak HR: 82 {beats}/min
SRS: 2
SSS: 4

## 2017-04-12 MED ORDER — REGADENOSON 0.4 MG/5ML IV SOLN
0.4000 mg | Freq: Once | INTRAVENOUS | Status: AC
  Administered 2017-04-12: 0.4 mg via INTRAVENOUS

## 2017-04-12 MED ORDER — TECHNETIUM TC 99M TETROFOSMIN IV KIT
30.8000 | PACK | Freq: Once | INTRAVENOUS | Status: AC | PRN
  Administered 2017-04-12: 30.8 via INTRAVENOUS
  Filled 2017-04-12: qty 31

## 2017-04-12 MED ORDER — TECHNETIUM TC 99M TETROFOSMIN IV KIT
10.2000 | PACK | Freq: Once | INTRAVENOUS | Status: AC | PRN
  Administered 2017-04-12: 10.2 via INTRAVENOUS
  Filled 2017-04-12: qty 11

## 2017-04-16 ENCOUNTER — Other Ambulatory Visit: Payer: PPO

## 2017-04-19 ENCOUNTER — Ambulatory Visit
Admission: RE | Admit: 2017-04-19 | Discharge: 2017-04-19 | Disposition: A | Discharge status: Home / Self Care | Payer: PPO | Source: Ambulatory Visit | Attending: Family Medicine | Admitting: Family Medicine

## 2017-04-19 DIAGNOSIS — M549 Dorsalgia, unspecified: Secondary | ICD-10-CM

## 2017-04-19 DIAGNOSIS — M5010 Cervical disc disorder with radiculopathy, unspecified cervical region: Secondary | ICD-10-CM | POA: Diagnosis not present

## 2017-04-19 DIAGNOSIS — M5114 Intervertebral disc disorders with radiculopathy, thoracic region: Secondary | ICD-10-CM | POA: Diagnosis not present

## 2017-06-04 DIAGNOSIS — M546 Pain in thoracic spine: Secondary | ICD-10-CM | POA: Diagnosis not present

## 2017-06-04 DIAGNOSIS — E119 Type 2 diabetes mellitus without complications: Secondary | ICD-10-CM | POA: Diagnosis not present

## 2017-06-04 DIAGNOSIS — M25552 Pain in left hip: Secondary | ICD-10-CM | POA: Diagnosis not present

## 2017-06-04 DIAGNOSIS — N181 Chronic kidney disease, stage 1: Secondary | ICD-10-CM | POA: Diagnosis not present

## 2017-06-04 DIAGNOSIS — R1012 Left upper quadrant pain: Secondary | ICD-10-CM | POA: Diagnosis not present

## 2017-06-04 DIAGNOSIS — Z0000 Encounter for general adult medical examination without abnormal findings: Secondary | ICD-10-CM | POA: Diagnosis not present

## 2017-06-04 DIAGNOSIS — E785 Hyperlipidemia, unspecified: Secondary | ICD-10-CM | POA: Diagnosis not present

## 2017-06-07 ENCOUNTER — Other Ambulatory Visit: Payer: Self-pay | Admitting: Family Medicine

## 2017-06-07 DIAGNOSIS — R1012 Left upper quadrant pain: Secondary | ICD-10-CM

## 2017-06-07 DIAGNOSIS — Z87442 Personal history of urinary calculi: Secondary | ICD-10-CM

## 2017-06-07 DIAGNOSIS — R109 Unspecified abdominal pain: Secondary | ICD-10-CM

## 2017-06-20 DIAGNOSIS — M25552 Pain in left hip: Secondary | ICD-10-CM | POA: Diagnosis not present

## 2017-06-20 DIAGNOSIS — M545 Low back pain: Secondary | ICD-10-CM | POA: Diagnosis not present

## 2017-06-21 ENCOUNTER — Ambulatory Visit
Admission: RE | Admit: 2017-06-21 | Discharge: 2017-06-21 | Disposition: A | Discharge status: Home / Self Care | Payer: PPO | Source: Ambulatory Visit | Attending: Family Medicine | Admitting: Family Medicine

## 2017-06-21 DIAGNOSIS — R109 Unspecified abdominal pain: Secondary | ICD-10-CM | POA: Diagnosis not present

## 2017-06-21 DIAGNOSIS — R1012 Left upper quadrant pain: Secondary | ICD-10-CM

## 2017-06-21 DIAGNOSIS — Z87442 Personal history of urinary calculi: Secondary | ICD-10-CM

## 2017-06-21 MED ORDER — IOPAMIDOL (ISOVUE-300) INJECTION 61%
100.0000 mL | Freq: Once | INTRAVENOUS | Status: AC | PRN
  Administered 2017-06-21: 100 mL via INTRAVENOUS

## 2017-08-24 ENCOUNTER — Other Ambulatory Visit: Payer: Self-pay | Admitting: Family Medicine

## 2017-08-24 DIAGNOSIS — N6310 Unspecified lump in the right breast, unspecified quadrant: Secondary | ICD-10-CM

## 2017-08-29 ENCOUNTER — Other Ambulatory Visit: Payer: Self-pay | Admitting: Family Medicine

## 2017-08-29 ENCOUNTER — Ambulatory Visit: Payer: PPO

## 2017-08-29 ENCOUNTER — Ambulatory Visit
Admission: RE | Admit: 2017-08-29 | Discharge: 2017-08-29 | Disposition: A | Discharge status: Home / Self Care | Payer: PPO | Source: Ambulatory Visit | Attending: Family Medicine | Admitting: Family Medicine

## 2017-08-29 DIAGNOSIS — N6310 Unspecified lump in the right breast, unspecified quadrant: Secondary | ICD-10-CM

## 2017-08-29 DIAGNOSIS — R928 Other abnormal and inconclusive findings on diagnostic imaging of breast: Secondary | ICD-10-CM | POA: Diagnosis not present

## 2017-09-04 DIAGNOSIS — K5792 Diverticulitis of intestine, part unspecified, without perforation or abscess without bleeding: Secondary | ICD-10-CM | POA: Diagnosis not present

## 2017-09-04 DIAGNOSIS — E785 Hyperlipidemia, unspecified: Secondary | ICD-10-CM | POA: Diagnosis not present

## 2017-09-04 DIAGNOSIS — E1169 Type 2 diabetes mellitus with other specified complication: Secondary | ICD-10-CM | POA: Diagnosis not present

## 2018-01-01 DIAGNOSIS — H524 Presbyopia: Secondary | ICD-10-CM | POA: Diagnosis not present

## 2018-01-01 DIAGNOSIS — H5203 Hypermetropia, bilateral: Secondary | ICD-10-CM | POA: Diagnosis not present

## 2018-01-01 DIAGNOSIS — H52222 Regular astigmatism, left eye: Secondary | ICD-10-CM | POA: Diagnosis not present

## 2018-01-03 DIAGNOSIS — J0100 Acute maxillary sinusitis, unspecified: Secondary | ICD-10-CM | POA: Diagnosis not present

## 2018-01-03 DIAGNOSIS — J209 Acute bronchitis, unspecified: Secondary | ICD-10-CM | POA: Diagnosis not present

## 2018-01-03 DIAGNOSIS — R509 Fever, unspecified: Secondary | ICD-10-CM | POA: Diagnosis not present

## 2018-02-05 ENCOUNTER — Encounter (INDEPENDENT_AMBULATORY_CARE_PROVIDER_SITE_OTHER): Payer: Self-pay

## 2018-02-09 ENCOUNTER — Encounter (INDEPENDENT_AMBULATORY_CARE_PROVIDER_SITE_OTHER): Payer: Self-pay | Admitting: Bariatrics

## 2018-02-11 NOTE — Telephone Encounter (Signed)
Not sure what this is about

## 2018-02-13 DIAGNOSIS — Z23 Encounter for immunization: Secondary | ICD-10-CM | POA: Diagnosis not present

## 2018-02-13 DIAGNOSIS — E1169 Type 2 diabetes mellitus with other specified complication: Secondary | ICD-10-CM | POA: Diagnosis not present

## 2018-02-13 DIAGNOSIS — Z1159 Encounter for screening for other viral diseases: Secondary | ICD-10-CM | POA: Diagnosis not present

## 2018-02-20 ENCOUNTER — Encounter (INDEPENDENT_AMBULATORY_CARE_PROVIDER_SITE_OTHER): Payer: Self-pay | Admitting: Bariatrics

## 2018-02-20 ENCOUNTER — Ambulatory Visit (INDEPENDENT_AMBULATORY_CARE_PROVIDER_SITE_OTHER): Payer: PPO | Admitting: Bariatrics

## 2018-02-20 VITALS — BP 136/73 | Temp 97.9°F | Ht 65.0 in | Wt 174.0 lb

## 2018-02-20 DIAGNOSIS — N181 Chronic kidney disease, stage 1: Secondary | ICD-10-CM | POA: Diagnosis not present

## 2018-02-20 DIAGNOSIS — R5383 Other fatigue: Secondary | ICD-10-CM

## 2018-02-20 DIAGNOSIS — E7849 Other hyperlipidemia: Secondary | ICD-10-CM | POA: Diagnosis not present

## 2018-02-20 DIAGNOSIS — E1122 Type 2 diabetes mellitus with diabetic chronic kidney disease: Secondary | ICD-10-CM | POA: Diagnosis not present

## 2018-02-20 DIAGNOSIS — E559 Vitamin D deficiency, unspecified: Secondary | ICD-10-CM

## 2018-02-20 DIAGNOSIS — Z0289 Encounter for other administrative examinations: Secondary | ICD-10-CM

## 2018-02-20 DIAGNOSIS — Z6830 Body mass index (BMI) 30.0-30.9, adult: Secondary | ICD-10-CM

## 2018-02-20 DIAGNOSIS — R0602 Shortness of breath: Secondary | ICD-10-CM | POA: Diagnosis not present

## 2018-02-20 DIAGNOSIS — E669 Obesity, unspecified: Secondary | ICD-10-CM | POA: Diagnosis not present

## 2018-02-20 DIAGNOSIS — Z1331 Encounter for screening for depression: Secondary | ICD-10-CM | POA: Diagnosis not present

## 2018-02-21 LAB — MICROALBUMIN / CREATININE URINE RATIO
CREATININE, UR: 56.1 mg/dL
MICROALB/CREAT RATIO: 7.7 mg/g{creat} (ref 0.0–30.0)
Microalbumin, Urine: 4.3 ug/mL

## 2018-02-21 LAB — INSULIN, RANDOM: INSULIN: 27.7 u[IU]/mL — AB (ref 2.6–24.9)

## 2018-02-21 LAB — VITAMIN D 25 HYDROXY (VIT D DEFICIENCY, FRACTURES): VIT D 25 HYDROXY: 43.2 ng/mL (ref 30.0–100.0)

## 2018-02-27 DIAGNOSIS — Z6830 Body mass index (BMI) 30.0-30.9, adult: Secondary | ICD-10-CM

## 2018-02-27 DIAGNOSIS — E669 Obesity, unspecified: Secondary | ICD-10-CM | POA: Insufficient documentation

## 2018-02-27 NOTE — Progress Notes (Signed)
.  Office: [phone]  /  Fax: [phone]   HPI:   Chief Complaint: OBESITY  Lauren James (MR# [id number]) is a 67 y.o. female who presents on 02/27/2018 for obesity evaluation and treatment. Current BMI is Body mass index is 28.96 kg/m.[name] has struggled with obesity for years and has been unsuccessful in either losing weight or maintaining long term weight loss. Ariza states that she struggles with deciding what to cook. Lauren James attended our information session and states she is currently in the action stage of change and ready to dedicate time achieving and maintaining a healthier weight.  Lauren James states her family eats meals together she thinks her family will eat healthier with her her desired weight loss is 44 lbs she started gaining weight in her late 51's her heaviest weight ever was 196 lbs. she has significant food cravings for salt, fried food, and carbohydrates  she snacks frequently in the evenings she occasionally skips breakfast she frequently makes poor food choices she has problems with excessive hunger  she frequently eats larger portions than normal  she has binge eating behaviors she struggles with emotional eating    Fatigue Lauren James has generalized fatigue and she feels her energy is lower than it should be. This has worsened with weight gain and has not worsened recently. Lauren James admits to daytime somnolence and she admits to waking up still tired. Patient is at risk for obstructive sleep apnea. Patent has a history of symptoms of daytime fatigue and morning fatigue. Patient generally gets 4 or 5 hours of sleep per night, and states they generally have restless sleep. Snoring is present. Apneic episodes are not present. Epworth Sleepiness Score is 2  Dyspnea on exertion Lauren James notes increasing shortness of breath with certain activities and seems to be worsening over time with weight gain. She notes getting out of breath sooner with activity than she used to. This has not  gotten worse recently. Hue denies orthopnea.  Vitamin D deficiency Lauren James has a diagnosis of vitamin D deficiency. She is currently taking OTC vit D and denies nausea, vomiting or muscle weakness.  Diabetes II non insulin with Stage I CKD [name] has a diagnosis of diabetes type II. She is taking Jardiance and Glipizide. Lauren James denies any hypoglycemic episodes. Last A1c was at 7.6 at her last labs on 02/15/18. She is attempting to work on intensive lifestyle modifications including diet, exercise, and weight loss to help control her blood glucose levels.  Hyperlipidemia [name] has hyperlipidemia. She is taking Rosuvastatin and Omega-3 and she is reasonably controlled. She is attempting to improve her cholesterol levels with intensive lifestyle modification including a low saturated fat diet, exercise and weight loss. She admits to myalgias.  Depression Screen Lauren James's Food and Mood (modified PHQ-9) score was  Depression screen PHQ 2/9 02/20/2018  Decreased Interest 2  Down, Depressed, Hopeless 2  PHQ - 2 Score 4  Altered sleeping 1  Tired, decreased energy 3  Change in appetite 1  Feeling bad or failure about yourself  2  Trouble concentrating 2  Moving slowly or fidgety/restless 1  Suicidal thoughts 0  PHQ-9 Score 14  Difficult doing work/chores Somewhat difficult    ALLERGIES: Allergies  Allergen Reactions  . Adhesive [Tape] Itching and Other (See Comments)    BLISTERS    MEDICATIONS: Current Outpatient Medications on File Prior to Visit  Medication Sig Dispense Refill  . Cholecalciferol (VITAMIN D-3) 5000 UNIT/ML LIQD Place under the tongue.    [name] glipiZIDE (GLUCOTROL)  5 MG tablet Take by mouth daily before breakfast.    . JARDIANCE 25 MG TABS tablet Take 25 mg by mouth daily.  2  . Multiple Vitamins-Minerals (CENTRUM SILVER 50+WOMEN PO) Take by mouth.    . rosuvastatin (CRESTOR) 5 MG tablet Take 5 mg by mouth every evening.  1  . Omega-3 Fatty Acids (OMEGA-3 FISH OIL PO) Take 1  capsule by mouth daily. Omega  Red Fish Oil    . tiZANidine (ZANAFLEX) 2 MG tablet Take 2 mg by mouth daily as needed.  1   No current facility-administered medications on file prior to visit.     PAST MEDICAL HISTORY: Past Medical History:  Diagnosis Date  . Atypical chest pain 04/12/2017  . Back pain   . Diabetes mellitus type 2 in obese ([place]) 04/12/2017  . Dysuria   . Frequency of urination   . GERD (gastroesophageal reflux disease)    WATCHES DIET  . Hematuria   . History of kidney problems   . History of kidney stones   . Hyperlipidemia 04/12/2017  . Nocturia   . Prediabetes    DIET CONTROLLED  . Right ureteral stone   . Urgency of urination   . Vitamin D deficiency   . Wears glasses     PAST SURGICAL HISTORY: Past Surgical History:  Procedure Laterality Date  . CHOLECYSTECTOMY  2000  (APPROX)  . CYSTOSCOPY W/ URETERAL STENT PLACEMENT Right 12/13/2012   Procedure: CYSTOSCOPY/RIGHT URETERAL STENT EXCHANGE/RIGHT RETROGRADE PYELOGRAM;  Surgeon: [name], MD;  Location: [facility];  Service: Urology;  Laterality: Right;  . CYSTOSCOPY WITH URETEROSCOPY Right 11/27/2012   Procedure: CYSTOSCOPY WITH RIGHT  URETEROSCOPY AND STENT PLACEMENT ;  Surgeon: [name], MD;  Location: [facility];  Service: Urology;  Laterality: Right;  . [place]  (APPROX)  . HOLMIUM LASER APPLICATION Right 6/0/7371   Procedure: HOLMIUM LASER APPLICATION;  Surgeon: [name], MD;  Location: [facility];  Service: Urology;  Laterality: Right;  . HOLMIUM LASER APPLICATION Right 0/62/6948   Procedure: HOLMIUM LASER APPLICATION;  Surgeon: [name], MD;  Location: [facility];  Service: Urology;  Laterality: Right;  . URETEROSCOPY Right 12/13/2012   Procedure: URETEROSCOPY;  Surgeon: [name], MD;  Location: [facility];  Service: Urology;  Laterality: Right;    SOCIAL HISTORY: Social History   Tobacco  Use  . Smoking status: Never Smoker  . Smokeless tobacco: Never Used  Substance Use Topics  . Alcohol use: No  . Drug use: No    FAMILY HISTORY: Family History  Problem Relation Age of Onset  . Diabetes Mother   . Kidney disease Mother   . Heart disease Mother   . High blood pressure Mother   . Stroke Mother   . Obesity Mother   . Dementia Father   . Stroke Father   . Heart attack Sister   . Diabetes Sister   . Diabetes Maternal Grandmother   . Heart attack Maternal Grandfather   . Alcoholism Paternal Grandfather   . Diabetes Sister   . Osteoporosis Sister     ROS: Review of Systems  Constitutional: Positive for malaise/fatigue.       + Breasts Lumps  HENT: Positive for congestion (nasal stuffiness), hearing loss, sinus pain and tinnitus.        + Dry Mouth + Hoarseness  Eyes:       + Wear Glasses or Contacts + Flashes  of Light + Floaters  Respiratory: Positive for shortness of breath.   Cardiovascular: Negative for orthopnea.       + Shortness of Breath with Activity + Calf/Leg Pain with Walking + Leg Cramping  Gastrointestinal: Positive for diarrhea. Negative for nausea and vomiting.  Musculoskeletal: Positive for back pain and myalgias.       + Neck Stiffness + Muscle or Joint Pain + Muscle Stiffness Negative for muscle weakness  Skin: Positive for itching.       + Dryness  Endo/Heme/Allergies:       Negative for hypoglycemia  Psychiatric/Behavioral: Positive for depression.    PHYSICAL EXAM: Blood pressure 136/73, temperature 97.9 F (36.6 C), temperature source Oral, height 5\' 5"  (1.651 m), weight 174 lb (78.9 kg). Body mass index is 28.96 kg/m. Physical Exam  Constitutional: She is oriented to person, place, and time. She appears well-developed and well-nourished.  HENT:  Head: Normocephalic and atraumatic.  Nose: Nose normal.  Mallampati = 3  Eyes: EOM are normal. No scleral icterus.  Neck: Normal range of motion. Neck supple. No thyromegaly  present.  Cardiovascular: Normal rate and regular rhythm.  Pulmonary/Chest: Effort normal. No respiratory distress.  Abdominal: Soft. There is no tenderness.  + Obesity  Musculoskeletal: Normal range of motion.  Range of Motion normal in all 4 extremities  Neurological: She is alert and oriented to person, place, and time. Coordination normal.  Skin: Skin is warm and dry.  Psychiatric: She has a normal mood and affect.  Vitals reviewed.   RECENT LABS AND TESTS: BMET    Component Value Date/Time   NA 138 12/15/2012 2350   K 3.8 12/15/2012 2350   CL 102 12/15/2012 2350   CO2 22 12/15/2012 2350   GLUCOSE 220 (H) 12/15/2012 2350   BUN 13 12/15/2012 2350   CREATININE 0.78 12/15/2012 2350   CALCIUM 8.7 12/15/2012 2350   GFRNONAA 88 (L) 12/15/2012 2350   GFRAA >90 12/15/2012 2350   No results found for: HGBA1C Lab Results  Component Value Date   INSULIN 27.7 (H) 02/20/2018   CBC    Component Value Date/Time   WBC 13.1 (H) 12/15/2012 2320   RBC 4.69 12/15/2012 2320   HGB 13.7 12/15/2012 2320   HCT 39.5 12/15/2012 2320   PLT 215 12/15/2012 2320   MCV 84.2 12/15/2012 2320   MCH 29.2 12/15/2012 2320   MCHC 34.7 12/15/2012 2320   RDW 12.9 12/15/2012 2320   LYMPHSABS 3.1 12/15/2012 2320   MONOABS 0.9 12/15/2012 2320   EOSABS 0.1 12/15/2012 2320   BASOSABS 0.0 12/15/2012 2320   Iron/TIBC/Ferritin/ %Sat No results found for: IRON, TIBC, FERRITIN, IRONPCTSAT Lipid Panel  No results found for: CHOL, TRIG, HDL, CHOLHDL, VLDL, LDLCALC, LDLDIRECT Hepatic Function Panel     Component Value Date/Time   PROT 6.6 12/15/2012 2350   ALBUMIN 3.6 12/15/2012 2350   AST 20 12/15/2012 2350   ALT 31 12/15/2012 2350   ALKPHOS 64 12/15/2012 2350   BILITOT 0.3 12/15/2012 2350   No results found for: TSH Vitamin D There are no recent lab results  ECG  shows NSR with a rate of 67 BPM INDIRECT CALORIMETER done today shows a VO2 of 259 and a REE of 1805. Her calculated basal metabolic  rate is 1025 thus her basal metabolic rate is better than expected.    ASSESSMENT AND PLAN: Other fatigue - Plan: EKG 12-Lead  SOB (shortness of breath) on exertion  Type 2 diabetes mellitus with stage 1 chronic  kidney disease, without long-term current use of insulin ([place]) - Plan: Microalbumin / creatinine urine ratio, Insulin, random  Other hyperlipidemia  Vitamin D deficiency - Plan: VITAMIN D 25 Hydroxy (Vit-D Deficiency, Fractures)  Depression screening  Class 1 obesity with serious comorbidity and body mass index (BMI) of 30.0 to 30.9 in adult, unspecified obesity type  PLAN:  Fatigue Veatrice was informed that her fatigue may be related to obesity, depression or many other causes. Labs will be ordered, and in the meanwhile Brinlee has agreed to work on diet, exercise and weight loss to help with fatigue. Proper sleep hygiene was discussed including the need for 7-8 hours of quality sleep each night. A sleep study was not ordered based on symptoms and Epworth score.  Dyspnea on exertion Falicia's shortness of breath appears to be obesity related and exercise induced. She has agreed to work on weight loss and gradually increase exercise to treat her exercise induced shortness of breath. If Niemah follows our instructions and loses weight without improvement of her shortness of breath, we will plan to refer to pulmonology. We will monitor this condition regularly. Jozi agrees to this plan.  Vitamin D Deficiency Rosibel was informed that low vitamin D levels contributes to fatigue and are associated with obesity, breast, and colon cancer. She will continue to take OTC Vit D and will follow up for routine testing of vitamin D, at least 2-3 times per year. She was informed of the risk of over-replacement of vitamin D and agrees to not increase her dose unless she discusses this with [place] first. We will check vitamin D level and Jonise will follow up as directed.  Diabetes II non insulin with Stage I  CKD Min has been given extensive diabetes education by myself today including ideal fasting and post-prandial blood glucose readings, individual ideal Hgb A1c goals and hypoglycemia prevention. We discussed the importance of good blood sugar control to decrease the likelihood of diabetic complications such as nephropathy, neuropathy, limb loss, blindness, coronary artery disease, and death. We discussed the importance of intensive lifestyle modification including diet, exercise and weight loss as the first line treatment for diabetes. Marsi agrees to continue her diabetes medications and will follow up at the agreed upon time.  Hyperlipidemia Demiyah was informed of the American Heart Association Guidelines emphasizing intensive lifestyle modifications as the first line treatment for hyperlipidemia. We discussed many lifestyle modifications today in depth, and Shamari will continue to work on decreasing saturated fats such as fatty red meat, butter and many fried foods. She will also increase vegetables and lean protein in her diet and continue to work on exercise and weight loss efforts. Ventura agrees to begin CoQ10 300 mg daily and continue her other medications as prescribed. Emmarie agrees to follow up as directed.  Depression Screen Sneha had a moderately positive depression screening. Depression is commonly associated with obesity and often results in emotional eating behaviors. We will monitor this closely and work on CBT to help improve the non-hunger eating patterns. Referral to Psychology may be required if no improvement is seen as she continues in our clinic.  Obesity Shawn is currently in the action stage of change and her goal is to continue with weight loss efforts She has agreed to follow the Category 2 plan +100 calories Perpetua has been instructed to work up to a goal of 150 minutes of combined cardio and strengthening exercise per week for weight loss and overall health benefits. We discussed the  following Behavioral  Modification Strategies today: increase H2O intake, increasing lean protein intake, decreasing simple carbohydrates , increasing vegetables, increase meal planning, holiday eating strategies  and celebration eating strategies  Lavonya has agreed to follow up with our clinic in 2 weeks. She was informed of the importance of frequent follow up visits to maximize her success with intensive lifestyle modifications for her multiple health conditions. She was informed we would discuss her lab results at her next visit unless there is a critical issue that needs to be addressed sooner. Shakeda agreed to keep her next visit at the agreed upon time to discuss these results.    OBESITY BEHAVIORAL INTERVENTION VISIT  Today's visit was # 1   Starting weight: 174 lbs Starting date: 02/20/2018 Today's weight : 174 lbs  Today's date: 02/20/2018 Total lbs lost to date: 0 At least 15 minutes were spent on discussing the following behavioral intervention visit.   ASK: We discussed the diagnosis of obesity with Cher Nakai today and Ezella agreed to give [place] permission to discuss obesity behavioral modification therapy today.  ASSESS: Delene has the diagnosis of obesity and her BMI today is 60.96 Malani is in the action stage of change   ADVISE: Kynadi was educated on the multiple health risks of obesity as well as the benefit of weight loss to improve her health. She was advised of the need for long term treatment and the importance of lifestyle modifications to improve her current health and to decrease her risk of future health problems.  AGREE: Multiple dietary modification options and treatment options were discussed and  Ladawna agreed to follow the recommendations documented in the above note.  ARRANGE: [place] was educated on the importance of frequent visits to treat obesity as outlined per CMS and USPSTF guidelines and agreed to schedule her next follow up appointment today.   Corey Skains, am acting as Location manager for [employer]. Owens Shark, DO  I have reviewed the above documentation for accuracy and completeness, and I agree with the above. -Jearld Lesch, DO

## 2018-03-06 ENCOUNTER — Ambulatory Visit
Admission: RE | Admit: 2018-03-06 | Discharge: 2018-03-06 | Disposition: A | Discharge status: Home / Self Care | Payer: PPO | Source: Ambulatory Visit | Attending: Family Medicine | Admitting: Family Medicine

## 2018-03-06 DIAGNOSIS — R928 Other abnormal and inconclusive findings on diagnostic imaging of breast: Secondary | ICD-10-CM | POA: Diagnosis not present

## 2018-03-06 DIAGNOSIS — N6310 Unspecified lump in the right breast, unspecified quadrant: Secondary | ICD-10-CM

## 2018-03-07 ENCOUNTER — Ambulatory Visit (INDEPENDENT_AMBULATORY_CARE_PROVIDER_SITE_OTHER): Payer: PPO | Admitting: Bariatrics

## 2018-03-07 ENCOUNTER — Encounter (INDEPENDENT_AMBULATORY_CARE_PROVIDER_SITE_OTHER): Payer: Self-pay | Admitting: Bariatrics

## 2018-03-07 VITALS — BP 149/77 | HR 72 | Temp 97.9°F | Ht 65.0 in | Wt 170.0 lb

## 2018-03-07 DIAGNOSIS — E7849 Other hyperlipidemia: Secondary | ICD-10-CM

## 2018-03-07 DIAGNOSIS — E1122 Type 2 diabetes mellitus with diabetic chronic kidney disease: Secondary | ICD-10-CM

## 2018-03-07 DIAGNOSIS — E669 Obesity, unspecified: Secondary | ICD-10-CM | POA: Diagnosis not present

## 2018-03-07 DIAGNOSIS — N181 Chronic kidney disease, stage 1: Secondary | ICD-10-CM

## 2018-03-07 DIAGNOSIS — E559 Vitamin D deficiency, unspecified: Secondary | ICD-10-CM | POA: Diagnosis not present

## 2018-03-07 DIAGNOSIS — Z6830 Body mass index (BMI) 30.0-30.9, adult: Secondary | ICD-10-CM | POA: Diagnosis not present

## 2018-03-07 MED ORDER — GLUCOSE BLOOD VI STRP: ORAL_STRIP | 0 refills | Status: AC

## 2018-03-08 IMAGING — CR DG THORACIC SPINE 3V
3 series · 3 of 3 positions shown · non-contrast
Comparison: None.

CLINICAL DATA: Thoracic spine pain radiating to shoulders for 6
months.

EXAM:
THORACIC SPINE - 3 VIEWS

[w thoracic spine ap]
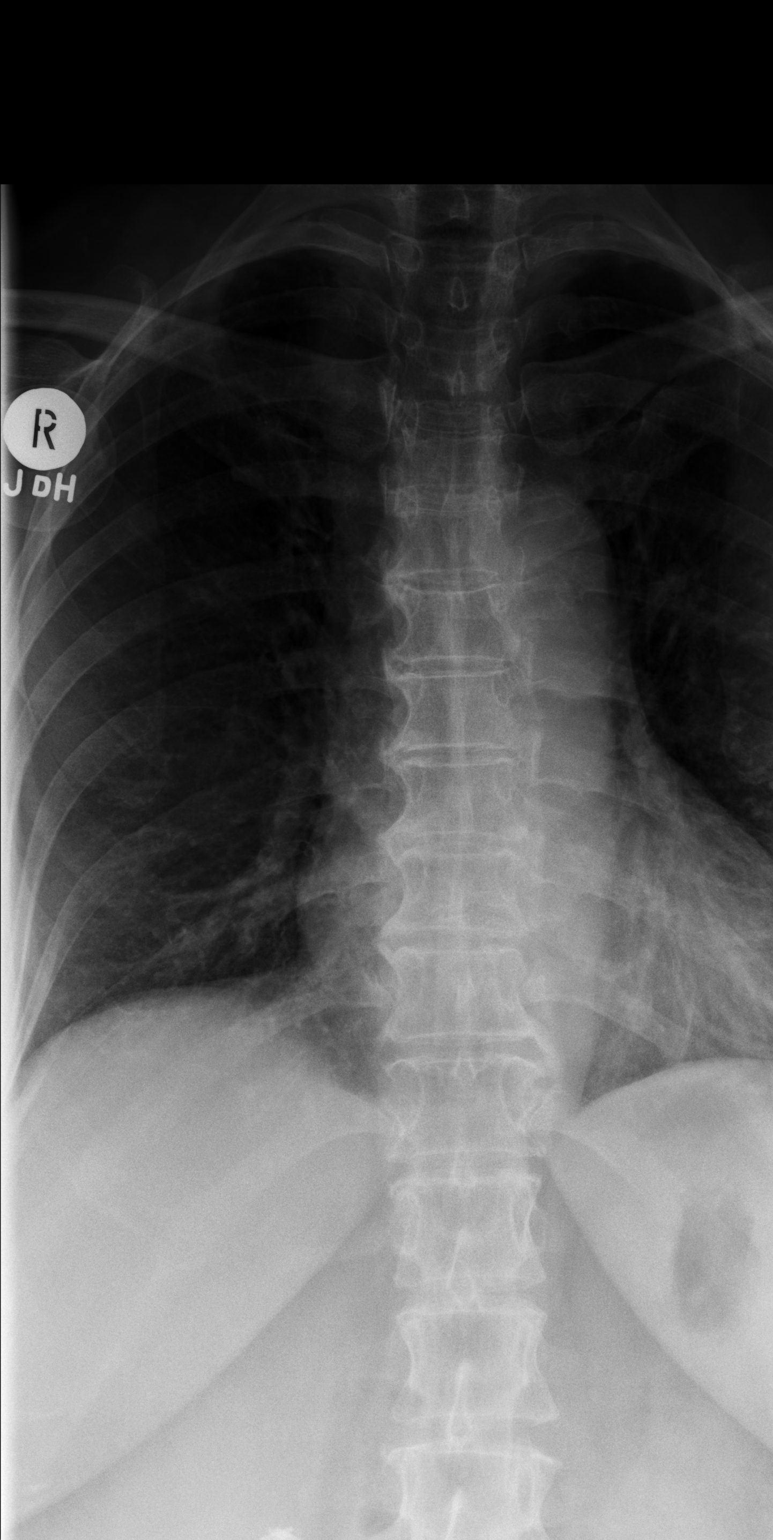

[w thoracic spine lat]
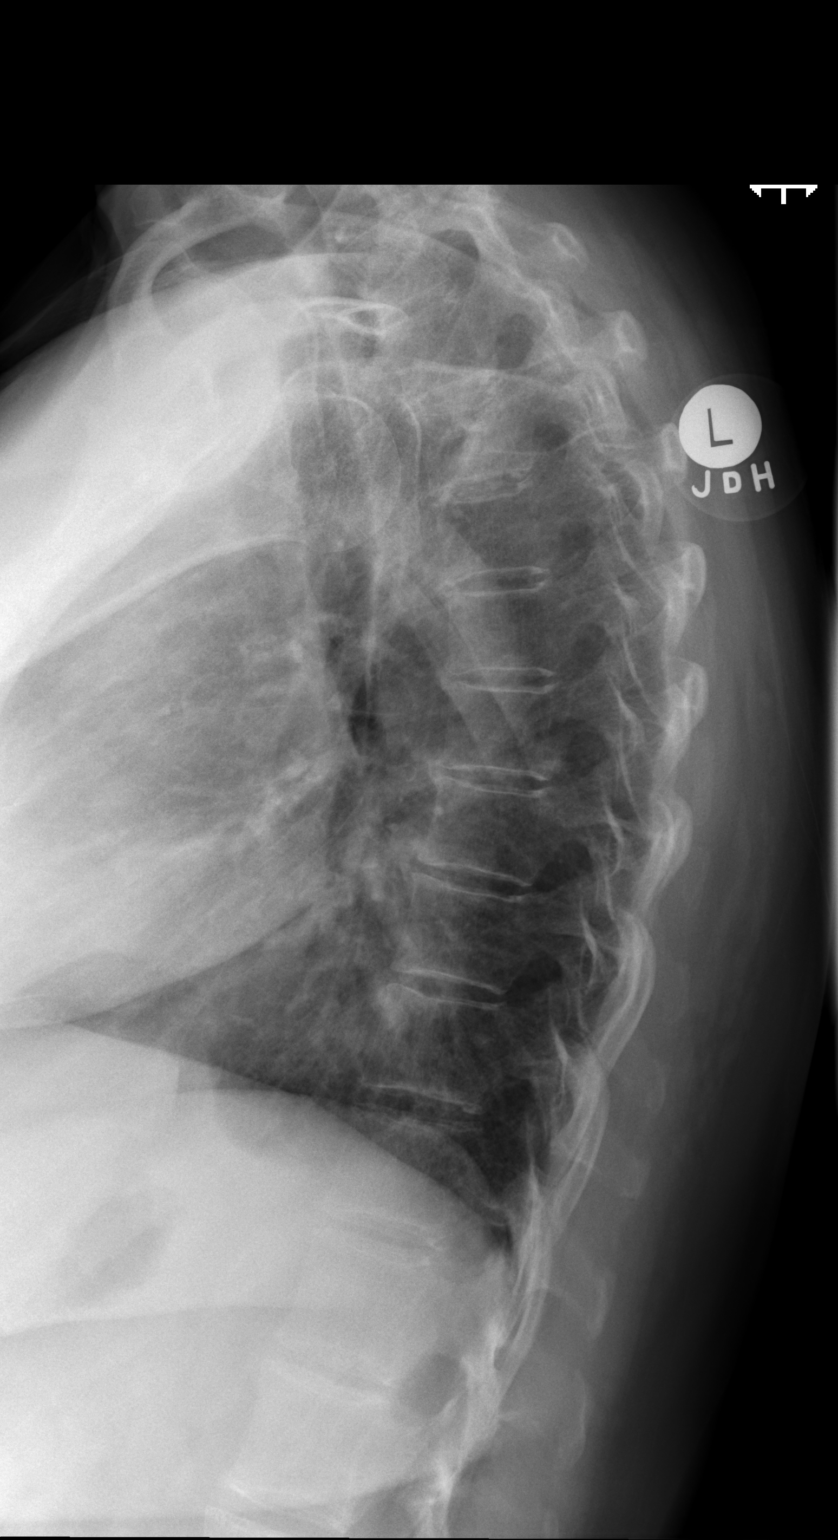

[w thoracic swimmers]
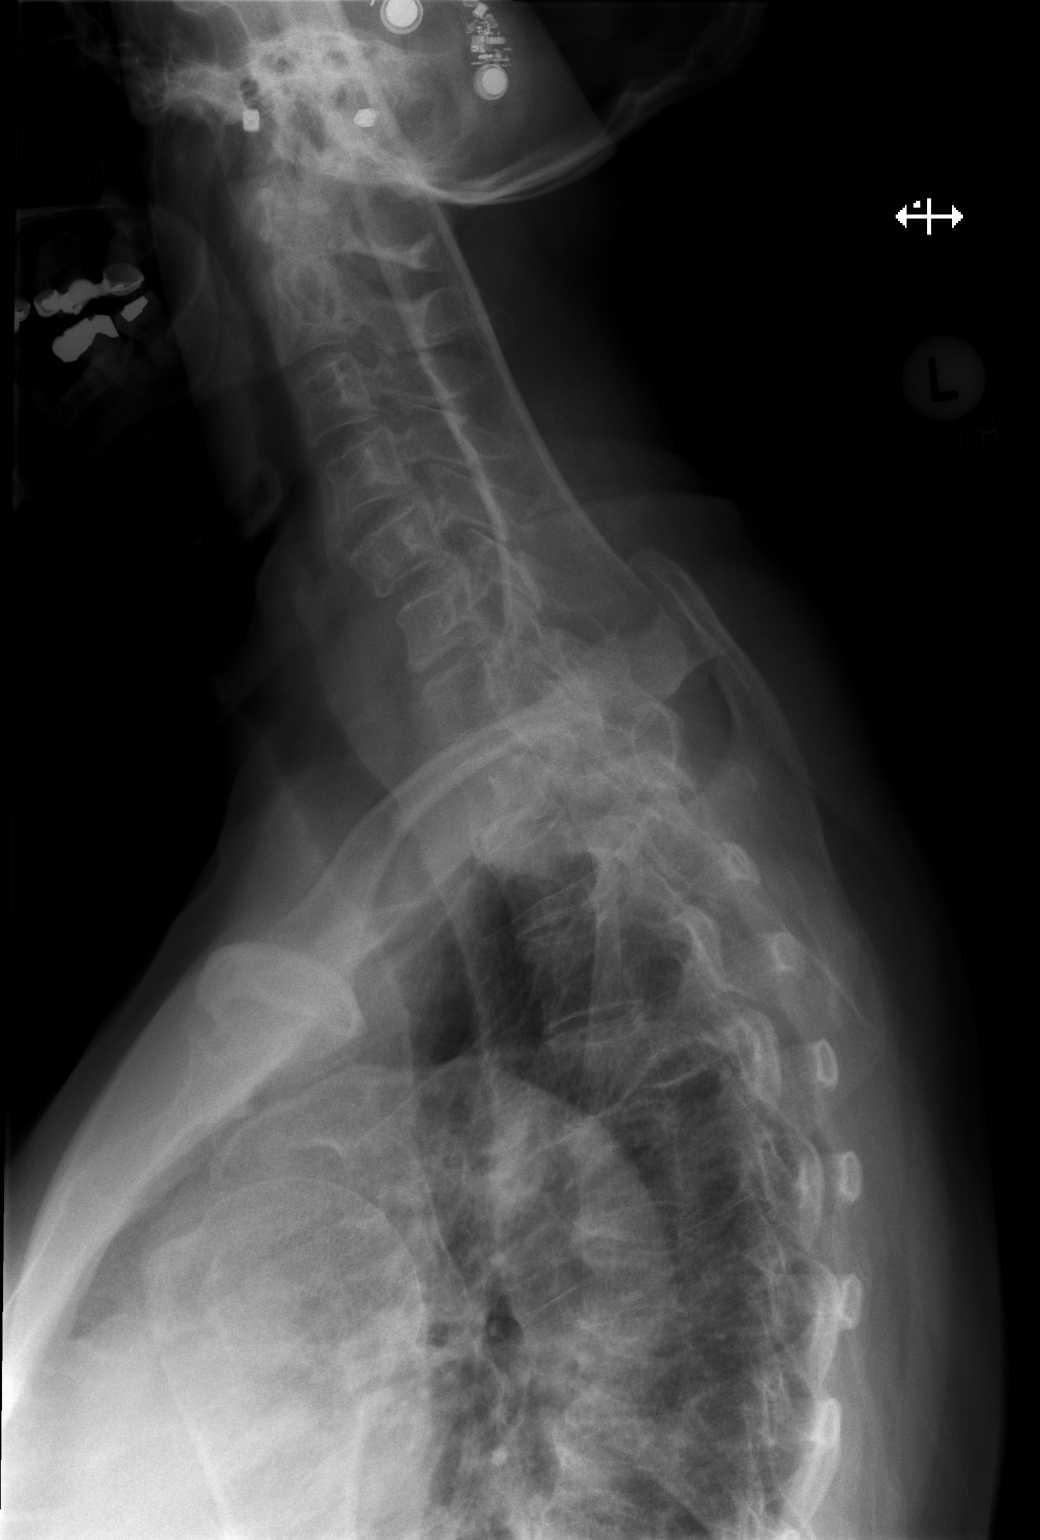

[3 of 3 positions shown; findings below may reference images not displayed]

FINDINGS: There is no evidence of thoracic spine fracture. Alignment is
normal. Disc heights preserved with mild endplate spurring, less
than expected for age. No other significant bone abnormalities are
identified.
IMPRESSION: Negative.

## 2018-03-11 NOTE — Progress Notes (Signed)
Office: [phone]  /  Fax: [phone]   HPI:   Chief Complaint: OBESITY Lauren James is here to discuss her progress with her obesity treatment plan. She is on the Category 2 plan +100 calories and is following her eating plan approximately 50 % of the time. She states she is exercising 0 minutes 0 times per week. Lauren James is doing well. She has been traveling ([place]). Lauren James states that she does not like greek yogurt. She has been hungry in the afternoon. Her weight is 170 lb (77.1 kg) today and has had a weight loss of 4 pounds over a period of 2 weeks since her last visit. She has lost 4 lbs since starting treatment with [place].  Diabetes II non insulin, Stage 1 CKD Lauren James has a diagnosis of diabetes type II. She is currently taking glipizide and jardiance. Lauren James states fasting BGs range between 170 and 180, 2 hour post prandial BGs are 197 and she denies any hypoglycemic episodes. Last A1c was at 7.6 and last insulin level was at 43.2. Her last microalbumin was negative. She has been working on intensive lifestyle modifications including diet, exercise, and weight loss to help control her blood glucose levels.  Hyperlipidemia Lauren James has hyperlipidemia and she is currently taking crestor. Lauren James has been trying to improve her cholesterol levels with intensive lifestyle modification including a low saturated fat diet, exercise and weight loss. She admits myalgias.  Vitamin D deficiency Lauren James has a diagnosis of vitamin D deficiency. She is currently taking vit D and denies nausea, vomiting or muscle weakness.  Vitamin D deficiency Lauren James has a diagnosis of vitamin D deficiency. She is currently taking OTC vit D and denies nausea, vomiting or muscle weakness.  ALLERGIES: Allergies  Allergen Reactions  . Adhesive [Tape] Itching and Other (See Comments)    BLISTERS    MEDICATIONS: Current Outpatient Medications on File Prior to Visit  Medication Sig Dispense Refill  . Cholecalciferol (VITAMIN D-3) 5000  UNIT/ML LIQD Place under the tongue.    [name] glipiZIDE (GLUCOTROL) 5 MG tablet Take by mouth daily before breakfast.    . JARDIANCE 25 MG TABS tablet Take 25 mg by mouth daily.  2  . Multiple Vitamins-Minerals (CENTRUM SILVER 50+WOMEN PO) Take by mouth.    . Omega-3 Fatty Acids (OMEGA-3 FISH OIL PO) Take 1 capsule by mouth daily. Omega  Red Fish Oil    . rosuvastatin (CRESTOR) 5 MG tablet Take 5 mg by mouth every evening.  1  . tiZANidine (ZANAFLEX) 2 MG tablet Take 2 mg by mouth daily as needed.  1   No current facility-administered medications on file prior to visit.     PAST MEDICAL HISTORY: Past Medical History:  Diagnosis Date  . Atypical chest pain 04/12/2017  . Back pain   . Diabetes mellitus type 2 in obese ([place]) 04/12/2017  . Dysuria   . Frequency of urination   . GERD (gastroesophageal reflux disease)    WATCHES DIET  . Hematuria   . History of kidney problems   . History of kidney stones   . Hyperlipidemia 04/12/2017  . Nocturia   . Prediabetes    DIET CONTROLLED  . Right ureteral stone   . Urgency of urination   . Vitamin D deficiency   . Wears glasses     PAST SURGICAL HISTORY: Past Surgical History:  Procedure Laterality Date  . CHOLECYSTECTOMY  2000  (APPROX)  . CYSTOSCOPY W/ URETERAL STENT PLACEMENT Right 12/13/2012   Procedure: CYSTOSCOPY/RIGHT URETERAL  STENT EXCHANGE/RIGHT RETROGRADE PYELOGRAM;  Surgeon: [name], MD;  Location: [facility];  Service: Urology;  Laterality: Right;  . CYSTOSCOPY WITH URETEROSCOPY Right 11/27/2012   Procedure: CYSTOSCOPY WITH RIGHT  URETEROSCOPY AND STENT PLACEMENT ;  Surgeon: [name], MD;  Location: [facility];  Service: Urology;  Laterality: Right;  . [place]  (APPROX)  . HOLMIUM LASER APPLICATION Right 06/01/8586   Procedure: HOLMIUM LASER APPLICATION;  Surgeon: [name], MD;  Location: [facility];  Service: Urology;  Laterality: Right;  . HOLMIUM LASER APPLICATION Right  07/27/7739   Procedure: HOLMIUM LASER APPLICATION;  Surgeon: [name], MD;  Location[facility];  Service: Urology;  Laterality: Right;  . URETEROSCOPY Right 12/13/2012   Procedure: URETEROSCOPY;  Surgeon: [name], MD;  Location: [facility];  Service: Urology;  Laterality: Right;    SOCIAL HISTORY: Social History   Tobacco Use  . Smoking status: Never Smoker  . Smokeless tobacco: Never Used  Substance Use Topics  . Alcohol use: No  . Drug use: No    FAMILY HISTORY: Family History  Problem Relation Age of Onset  . Diabetes Mother   . Kidney disease Mother   . Heart disease Mother   . High blood pressure Mother   . Stroke Mother   . Obesity Mother   . Dementia Father   . Stroke Father   . Heart attack Sister   . Diabetes Sister   . Diabetes Maternal Grandmother   . Heart attack Maternal Grandfather   . Alcoholism Paternal Grandfather   . Diabetes Sister   . Osteoporosis Sister     ROS: Review of Systems  Constitutional: Positive for weight loss.  Gastrointestinal: Negative for nausea and vomiting.  Musculoskeletal: Positive for myalgias.       Negative for muscle weakness    PHYSICAL EXAM: Blood pressure (!) 149/77, pulse 72, temperature 97.9 F (36.6 C), temperature source Oral, height 5\' 5"  (1.651 m), weight 170 lb (77.1 kg), SpO2 97 %. Body mass index is 28.29 kg/m. Physical Exam Vitals signs reviewed.  Constitutional:      Appearance: Normal appearance. She is well-developed. She is obese.  Cardiovascular:     Rate and Rhythm: Normal rate.  Pulmonary:     Effort: Pulmonary effort is normal.  Musculoskeletal: Normal range of motion.  Skin:    General: Skin is warm and dry.  Neurological:     Mental Status: She is alert and oriented to person, place, and time.  Psychiatric:        Mood and Affect: Mood normal.        Behavior: Behavior normal.     RECENT LABS AND TESTS: BMET    Component Value  Date/Time   NA 138 12/15/2012 2350   K 3.8 12/15/2012 2350   CL 102 12/15/2012 2350   CO2 22 12/15/2012 2350   GLUCOSE 220 (H) 12/15/2012 2350   BUN 13 12/15/2012 2350   CREATININE 0.78 12/15/2012 2350   CALCIUM 8.7 12/15/2012 2350   GFRNONAA 88 (L) 12/15/2012 2350   GFRAA >90 12/15/2012 2350   No results found for: HGBA1C Lab Results  Component Value Date   INSULIN 27.7 (H) 02/20/2018   CBC    Component Value Date/Time   WBC 13.1 (H) 12/15/2012 2320   RBC 4.69 12/15/2012 2320   HGB 13.7 12/15/2012 2320   HCT 39.5 12/15/2012 2320   PLT 215 12/15/2012 2320  MCV 84.2 12/15/2012 2320   MCH 29.2 12/15/2012 2320   MCHC 34.7 12/15/2012 2320   RDW 12.9 12/15/2012 2320   LYMPHSABS 3.1 12/15/2012 2320   MONOABS 0.9 12/15/2012 2320   EOSABS 0.1 12/15/2012 2320   BASOSABS 0.0 12/15/2012 2320   Iron/TIBC/Ferritin/ %Sat No results found for: IRON, TIBC, FERRITIN, IRONPCTSAT Lipid Panel  No results found for: CHOL, TRIG, HDL, CHOLHDL, VLDL, LDLCALC, LDLDIRECT Hepatic Function Panel     Component Value Date/Time   PROT 6.6 12/15/2012 2350   ALBUMIN 3.6 12/15/2012 2350   AST 20 12/15/2012 2350   ALT 31 12/15/2012 2350   ALKPHOS 64 12/15/2012 2350   BILITOT 0.3 12/15/2012 2350   No results found for: TSH   Ref. Range 02/20/2018 11:46  Vitamin D, 25-Hydroxy Latest Ref Range: 30.0 - 100.0 ng/mL 43.2   ASSESSMENT AND PLAN: Type 2 diabetes mellitus with stage 1 chronic kidney disease, without long-term current use of insulin (HCC) - Plan: glucose blood (IGLUCOSE TEST STRIPS) test strip  Other hyperlipidemia  Vitamin D deficiency  Class 1 obesity with serious comorbidity and body mass index (BMI) of 30.0 to 30.9 in adult, unspecified obesity type - Starting BMI greater thne 30  PLAN:  Diabetes II non insulin, Stage 1 CKD Lauren James has been given extensive diabetes education by myself today including ideal fasting and post-prandial blood glucose readings, individual ideal Hgb  A1c goals and hypoglycemia prevention. We discussed the importance of good blood sugar control to decrease the likelihood of diabetic complications such as nephropathy, neuropathy, limb loss, blindness, coronary artery disease, and death. We discussed the importance of intensive lifestyle modification including diet, exercise and weight loss as the first line treatment for diabetes. Lauren James agrees to continue her diabetes medications and check her blood sugar two times daily. Prescription was written for Bayer glucose strips #100 with no refills. Lauren James agreed to follow up with our clinic in 2 weeks.   Hyperlipidemia Lauren James was informed of the American Heart Association Guidelines emphasizing intensive lifestyle modifications as the first line treatment for hyperlipidemia. We discussed many lifestyle modifications today in depth, and Lauren James will continue to work on decreasing saturated fats such as fatty red meat, butter and many fried foods. She will also increase vegetables and lean protein in her diet and continue to work on exercise and weight loss efforts. Lauren James agrees to begin OTC CoQ10 300 mg daily and follow up as directed.  Vitamin D Deficiency Lauren James was informed that low vitamin D levels contributes to fatigue and are associated with obesity, breast, and colon cancer. She agrees to continue OTC vitamin D and will follow up for routine testing of vitamin D, at least 2-3 times per year. She was informed of the risk of over-replacement of vitamin D and agrees to not increase her dose unless she discusses this with [place] first. Lauren James agrees to follow up with our clinic in 2 weeks.  Obesity Lauren James is currently in the action stage of change. As such, her goal is to continue with weight loss efforts She has agreed to follow the Category 2 plan +100 calories Lauren James has been instructed to work up to a goal of 150 minutes of combined cardio and strengthening exercise per week for weight loss and overall health  benefits. We discussed the following Behavioral Modification Strategies today: increase H2O intake, increasing lean protein intake, decreasing simple carbohydrates, increasing vegetables, work on meal planning and easy cooking plans and travel eating strategies   Lauren James has agreed to  follow up with our clinic in 2 weeks. She was informed of the importance of frequent follow up visits to maximize her success with intensive lifestyle modifications for her multiple health conditions.   OBESITY BEHAVIORAL INTERVENTION VISIT  Today's visit was # 2   Starting weight: 174 lbs Starting date: 02/20/2018 Today's weight : 170 lbs Today's date: 03/07/2018 Total lbs lost to date: 4 At least 15 minutes were spent on discussing the following behavioral intervention visit.   ASK: We discussed the diagnosis of obesity with Lauren James today and Lauren James agreed to give [place] permission to discuss obesity behavioral modification therapy today.  ASSESS: Lauren James has the diagnosis of obesity and her BMI today is 28.29 Lauren James is in the action stage of change   ADVISE: Lauren James was educated on the multiple health risks of obesity as well as the benefit of weight loss to improve her health. She was advised of the need for long term treatment and the importance of lifestyle modifications to improve her current health and to decrease her risk of future health problems.  AGREE: Multiple dietary modification options and treatment options were discussed and  Lauren James agreed to follow the recommendations documented in the above note.  ARRANGE: Lauren James was educated on the importance of frequent visits to treat obesity as outlined per CMS and USPSTF guidelines and agreed to schedule her next follow up appointment today.  Corey Skains, am acting as Location manager for [employer]. Owens Shark, DO  I have reviewed the above documentation for accuracy and completeness, and I agree with the above. -Jearld Lesch, DO

## 2018-03-28 ENCOUNTER — Ambulatory Visit (INDEPENDENT_AMBULATORY_CARE_PROVIDER_SITE_OTHER): Payer: PPO | Admitting: Bariatrics

## 2018-03-28 ENCOUNTER — Encounter (INDEPENDENT_AMBULATORY_CARE_PROVIDER_SITE_OTHER): Payer: Self-pay | Admitting: Bariatrics

## 2018-03-28 VITALS — BP 120/71 | HR 76 | Temp 97.8°F | Ht 65.0 in | Wt 167.0 lb

## 2018-03-28 DIAGNOSIS — N181 Chronic kidney disease, stage 1: Secondary | ICD-10-CM | POA: Diagnosis not present

## 2018-03-28 DIAGNOSIS — E669 Obesity, unspecified: Secondary | ICD-10-CM | POA: Diagnosis not present

## 2018-03-28 DIAGNOSIS — Z6830 Body mass index (BMI) 30.0-30.9, adult: Secondary | ICD-10-CM

## 2018-03-28 DIAGNOSIS — E1122 Type 2 diabetes mellitus with diabetic chronic kidney disease: Secondary | ICD-10-CM

## 2018-03-28 DIAGNOSIS — E7849 Other hyperlipidemia: Secondary | ICD-10-CM | POA: Diagnosis not present

## 2018-03-28 NOTE — Progress Notes (Signed)
Office: [phone]  /  Fax: [phone]   HPI:   Chief Complaint: OBESITY Lauren James is here to discuss her progress with her obesity treatment plan. She is on the Category 2 plan +100 calories  and is following her eating plan approximately 95 % of the time. She states she is exercising 0 minutes 0 times per week. Lauren James is doing well with the plan overall. She states that "she should be losing more, per her dedication to the plan". Her weight is 167 lb (75.8 kg) today and has had a weight loss of 3 pounds over a period of 3 weeks since her last visit. She has lost 7 lbs since starting treatment with [place].  Hyperlipidemia Lauren James has hyperlipidemia and she is taking Crestor and CoQ10. She has been trying to improve her cholesterol levels with intensive lifestyle modification including a low saturated fat diet, exercise and weight loss. She denies any myalgias.  Diabetes II non insulin, stage 1 chronic kidney disease Lauren James has a diagnosis of diabetes type II. She is currently taking Glipizide and Jardiance. Lauren James states fasting BGs range between 82 and 148 with no highs and previously was at 200. She has been working on intensive lifestyle modifications including diet, exercise, and weight loss to help control her blood glucose levels.  ASSESSMENT AND PLAN:  Other hyperlipidemia  Type 2 diabetes mellitus with stage 1 chronic kidney disease, without long-term current use of insulin (HCC)  Class 1 obesity with serious comorbidity and body mass index (BMI) of 30.0 to 30.9 in adult, unspecified obesity type - Starting BMI greater then 30  PLAN:  Hyperlipidemia Lauren James was informed of the American Heart Association Guidelines emphasizing intensive lifestyle modifications as the first line treatment for hyperlipidemia. We discussed many lifestyle modifications today in depth, and Lauren James will continue to work on decreasing saturated fats such as fatty red meat, butter and many fried foods. She will also  increase vegetables and lean protein in her diet and continue to work on exercise and weight loss efforts. Lauren James will continue her medications as prescribed and follow up as directed.  Diabetes II non insulin, stage 1 chronic kidney disease Lauren James has been given extensive diabetes education by myself today including ideal fasting and post-prandial blood glucose readings, individual ideal Hgb A1c goals and hypoglycemia prevention. We discussed the importance of good blood sugar control to decrease the likelihood of diabetic complications such as nephropathy, neuropathy, limb loss, blindness, coronary artery disease, and death. We discussed the importance of intensive lifestyle modification including diet, exercise and weight loss as the first line treatment for diabetes. Lauren James agrees to continue her diabetes medications and stop Glipizide and start back on the metformin. [name] agreed to follow up at the agreed upon time.  Obesity Lauren James is currently in the action stage of change. As such, her goal is to continue with weight loss efforts She has agreed to follow the Category 2 plan  +100 calories with additional Category 1 and Category 2 breakfast options Lauren James has been instructed to work up to a goal of 150 minutes of combined cardio and strengthening exercise per week for weight loss and overall health benefits. We discussed the following Behavioral Modification Strategies today: no food 3 to 4 hours before going to bed, increase H2O intake, increasing lean protein intake, decreasing simple carbohydrates, increasing vegetables and work on meal planning and easy cooking plans  Lauren James has agreed to follow up with our clinic in 2 weeks. She was informed of the importance  of frequent follow up visits to maximize her success with intensive lifestyle modifications for her multiple health conditions.  ALLERGIES: Allergies  Allergen Reactions  . Adhesive [Tape] Itching and Other (See Comments)    BLISTERS     MEDICATIONS: Current Outpatient Medications on File Prior to Visit  Medication Sig Dispense Refill  . Cholecalciferol (VITAMIN D-3) 5000 UNIT/ML LIQD Place under the tongue.    [name] glucose blood (IGLUCOSE TEST STRIPS) test strip Twice daily (use Bayer brand or whichever ins covers) 100 each 0  . JARDIANCE 25 MG TABS tablet Take 25 mg by mouth daily.  2  . Multiple Vitamins-Minerals (CENTRUM SILVER 50+WOMEN PO) Take by mouth.    . Omega-3 Fatty Acids (OMEGA-3 FISH OIL PO) Take 1 capsule by mouth daily. Omega  Red Fish Oil    . rosuvastatin (CRESTOR) 5 MG tablet Take 5 mg by mouth every evening.  1  . tiZANidine (ZANAFLEX) 2 MG tablet Take 2 mg by mouth daily as needed.  1   No current facility-administered medications on file prior to visit.     PAST MEDICAL HISTORY: Past Medical History:  Diagnosis Date  . Atypical chest pain 04/12/2017  . Back pain   . Diabetes mellitus type 2 in obese ([place]) 04/12/2017  . Dysuria   . Frequency of urination   . GERD (gastroesophageal reflux disease)    WATCHES DIET  . Hematuria   . History of kidney problems   . History of kidney stones   . Hyperlipidemia 04/12/2017  . Nocturia   . Prediabetes    DIET CONTROLLED  . Right ureteral stone   . Urgency of urination   . Vitamin D deficiency   . Wears glasses     PAST SURGICAL HISTORY: Past Surgical History:  Procedure Laterality Date  . CHOLECYSTECTOMY  2000  (APPROX)  . CYSTOSCOPY W/ URETERAL STENT PLACEMENT Right 12/13/2012   Procedure: CYSTOSCOPY/RIGHT URETERAL STENT EXCHANGE/RIGHT RETROGRADE PYELOGRAM;  Surgeon: [name], MD;  Location: [facility];  Service: Urology;  Laterality: Right;  . CYSTOSCOPY WITH URETEROSCOPY Right 11/27/2012   Procedure: CYSTOSCOPY WITH RIGHT  URETEROSCOPY AND STENT PLACEMENT ;  Surgeon: [name], MD;  Location: [facility];  Service: Urology;  Laterality: Right;  . [place]  (APPROX)  . HOLMIUM LASER  APPLICATION Right 04/04/2991   Procedure: HOLMIUM LASER APPLICATION;  Surgeon: [name], MD;  Location: [facility];  Service: Urology;  Laterality: Right;  . HOLMIUM LASER APPLICATION Right 10/09/9676   Procedure: HOLMIUM LASER APPLICATION;  Surgeon: [name], MD;  Location: [facility];  Service: Urology;  Laterality: Right;  . URETEROSCOPY Right 12/13/2012   Procedure: URETEROSCOPY;  Surgeon: [name], MD;  Location: [facility];  Service: Urology;  Laterality: Right;    SOCIAL HISTORY: Social History   Tobacco Use  . Smoking status: Never Smoker  . Smokeless tobacco: Never Used  Substance Use Topics  . Alcohol use: No  . Drug use: No    FAMILY HISTORY: Family History  Problem Relation Age of Onset  . Diabetes Mother   . Kidney disease Mother   . Heart disease Mother   . High blood pressure Mother   . Stroke Mother   . Obesity Mother   . Dementia Father   . Stroke Father   . Heart attack Sister   . Diabetes Sister   . Diabetes Maternal Grandmother   . Heart attack Maternal Grandfather   .  Alcoholism Paternal Grandfather   . Diabetes Sister   . Osteoporosis Sister     ROS: Review of Systems  Constitutional: Positive for weight loss.  Musculoskeletal: Negative for myalgias.    PHYSICAL EXAM: Blood pressure 120/71, pulse 76, temperature 97.8 F (36.6 C), temperature source Oral, height 5\' 5"  (1.651 m), weight 167 lb (75.8 kg), SpO2 96 %. Body mass index is 27.79 kg/m. Physical Exam Vitals signs reviewed.  Constitutional:      Appearance: Normal appearance. She is well-developed. She is obese.  Cardiovascular:     Rate and Rhythm: Normal rate.  Pulmonary:     Effort: Pulmonary effort is normal.  Musculoskeletal: Normal range of motion.  Skin:    General: Skin is warm and dry.  Neurological:     Mental Status: She is alert and oriented to person, place, and time.  Psychiatric:        Mood and Affect: Mood normal.         Behavior: Behavior normal.     RECENT LABS AND TESTS: BMET    Component Value Date/Time   NA 138 12/15/2012 2350   K 3.8 12/15/2012 2350   CL 102 12/15/2012 2350   CO2 22 12/15/2012 2350   GLUCOSE 220 (H) 12/15/2012 2350   BUN 13 12/15/2012 2350   CREATININE 0.78 12/15/2012 2350   CALCIUM 8.7 12/15/2012 2350   GFRNONAA 88 (L) 12/15/2012 2350   GFRAA >90 12/15/2012 2350   No results found for: HGBA1C Lab Results  Component Value Date   INSULIN 27.7 (H) 02/20/2018   CBC    Component Value Date/Time   WBC 13.1 (H) 12/15/2012 2320   RBC 4.69 12/15/2012 2320   HGB 13.7 12/15/2012 2320   HCT 39.5 12/15/2012 2320   PLT 215 12/15/2012 2320   MCV 84.2 12/15/2012 2320   MCH 29.2 12/15/2012 2320   MCHC 34.7 12/15/2012 2320   RDW 12.9 12/15/2012 2320   LYMPHSABS 3.1 12/15/2012 2320   MONOABS 0.9 12/15/2012 2320   EOSABS 0.1 12/15/2012 2320   BASOSABS 0.0 12/15/2012 2320   Iron/TIBC/Ferritin/ %Sat No results found for: IRON, TIBC, FERRITIN, IRONPCTSAT Lipid Panel  No results found for: CHOL, TRIG, HDL, CHOLHDL, VLDL, LDLCALC, LDLDIRECT Hepatic Function Panel     Component Value Date/Time   PROT 6.6 12/15/2012 2350   ALBUMIN 3.6 12/15/2012 2350   AST 20 12/15/2012 2350   ALT 31 12/15/2012 2350   ALKPHOS 64 12/15/2012 2350   BILITOT 0.3 12/15/2012 2350   No results found for: TSH Results for LORRAINE, CIMMINO (MRN [id number]) as of 03/28/2018 12:06  Ref. Range 02/20/2018 11:46  Vitamin D, 25-Hydroxy Latest Ref Range: 30.0 - 100.0 ng/mL 43.2     OBESITY BEHAVIORAL INTERVENTION VISIT  Today's visit was # 3   Starting weight: 174 lbs Starting date: 02/20/2018 Today's weight : 167 lbs Today's date: 03/28/2018 Total lbs lost to date: 7 At least 15 minutes were spent on discussing the following behavioral intervention visit.   ASK: We discussed the diagnosis of obesity with Cher Nakai today and Lauren James agreed to give [place] permission to discuss obesity  behavioral modification therapy today.  ASSESS: Lauren James has the diagnosis of obesity and her BMI today is 27.79 Lauren James is in the action stage of change   ADVISE: Lauren James was educated on the multiple health risks of obesity as well as the benefit of weight loss to improve her health. She was advised of the need for long term treatment and  the importance of lifestyle modifications to improve her current health and to decrease her risk of future health problems.  AGREE: Multiple dietary modification options and treatment options were discussed and  [place] agreed to follow the recommendations documented in the above note.  ARRANGE: Lauren James was educated on the importance of frequent visits to treat obesity as outlined per CMS and USPSTF guidelines and agreed to schedule her next follow up appointment today.  Corey Skains, am acting as Location manager for [employer]. Owens Shark, DO  I have reviewed the above documentation for accuracy and completeness, and I agree with the above. -Jearld Lesch, DO

## 2018-04-10 ENCOUNTER — Encounter (INDEPENDENT_AMBULATORY_CARE_PROVIDER_SITE_OTHER): Payer: Self-pay | Admitting: Bariatrics

## 2018-04-10 ENCOUNTER — Ambulatory Visit (INDEPENDENT_AMBULATORY_CARE_PROVIDER_SITE_OTHER): Payer: PPO | Admitting: Bariatrics

## 2018-04-10 VITALS — BP 119/60 | HR 66 | Temp 98.3°F | Ht 65.0 in | Wt 164.0 lb

## 2018-04-10 DIAGNOSIS — N181 Chronic kidney disease, stage 1: Secondary | ICD-10-CM

## 2018-04-10 DIAGNOSIS — E7849 Other hyperlipidemia: Secondary | ICD-10-CM | POA: Diagnosis not present

## 2018-04-10 DIAGNOSIS — E1122 Type 2 diabetes mellitus with diabetic chronic kidney disease: Secondary | ICD-10-CM

## 2018-04-10 DIAGNOSIS — H9319 Tinnitus, unspecified ear: Secondary | ICD-10-CM | POA: Diagnosis not present

## 2018-04-10 DIAGNOSIS — Z6830 Body mass index (BMI) 30.0-30.9, adult: Secondary | ICD-10-CM

## 2018-04-10 DIAGNOSIS — E669 Obesity, unspecified: Secondary | ICD-10-CM

## 2018-04-11 NOTE — Progress Notes (Signed)
Office: [phone]  /  Fax: [phone]   HPI:   Chief Complaint: OBESITY Lauren James is here to discuss her progress with her obesity treatment plan. She is on the Category 2 plan plus 100 calories with additional Category 1 and Category 2 breakfast options and is following her eating plan approximately 80 % of the time. She states she is exercising 0 minutes 0 times per week. Lauren James is doing well overall. Her husband is also on the plan, but is losing more weight, which is frustrating. Her weight is 164 lb (74.4 kg) today and has had a weight loss of 3 pounds over a period of 2 weeks since her last visit. She has lost 10 lbs since starting treatment with [place].  Diabetes II Lauren James has a diagnosis of diabetes type II. Lauren James states fasting BGs and 2 hour post prandial BGs range below 130. Lauren James is taking Metformin 500 mg daily and Jardiance. She stopped Glipizide at the last visit. She has been working on intensive lifestyle modifications including diet, exercise, and weight loss to help control her blood glucose levels.  Hyperlipidemia Lauren James has hyperlipidemia and she is taking Crestor and CoQ10. She has been trying to improve her cholesterol levels with intensive lifestyle modification including a low saturated fat diet, exercise and weight loss. She denies myalgias.  Tinnitus Lauren James had a recent ear exam and she is wearing hearing aids.  ASSESSMENT AND PLAN:  Type 2 diabetes mellitus with stage 1 chronic kidney disease, without long-term current use of insulin (HCC)  Other hyperlipidemia  Tinnitus, unspecified laterality  Class 1 obesity with serious comorbidity and body mass index (BMI) of 30.0 to 30.9 in adult, unspecified obesity type - beginning BMI >30  PLAN:  Diabetes II Lauren James has been given extensive diabetes education by myself today including ideal fasting and post-prandial blood glucose readings, individual ideal Hgb A1c goals and hypoglycemia prevention. We discussed the importance  of good blood sugar control to decrease the likelihood of diabetic complications such as nephropathy, neuropathy, limb loss, blindness, coronary artery disease, and death. We discussed the importance of intensive lifestyle modification including diet, exercise and weight loss as the first line treatment for diabetes. Ragena agrees to continue her diabetes medications (Jardiance and Metformin) and will follow up at the agreed upon time.  Hyperlipidemia Lauren James was informed of the American Heart Association Guidelines emphasizing intensive lifestyle modifications as the first line treatment for hyperlipidemia. We discussed many lifestyle modifications today in depth, and Lauren James will continue to work on decreasing saturated fats such as fatty red meat, butter and many fried foods. She will also increase vegetables and lean protein in her diet and continue to work on exercise and weight loss efforts. Lauren James will continue her medications as prescribed. We will check lipids at the next visit and make needed adjustments.  Tinnitus Lauren James will begin OTC Lipo-Flavonoids and follow up with our clinic in 2 weeks.  Obesity Lauren James is currently in the action stage of change. As such, her goal is to continue with weight loss efforts She has agreed to follow the Category 2 plan +100 calories Lauren James has been instructed to work up to a goal of 150 minutes of combined cardio and strengthening exercise per week for weight loss and overall health benefits. We discussed the following Behavioral Modification Strategies today: increase H2O intake, keeping healthy foods in the home, increasing lean protein intake, decreasing simple carbohydrates, increasing vegetables and work on meal planning and easy cooking plans  Lauren James has agreed  to follow up with our clinic in 2 weeks fasting. She was informed of the importance of frequent follow up visits to maximize her success with intensive lifestyle modifications for her multiple health  conditions.  ALLERGIES: Allergies  Allergen Reactions  . Adhesive [Tape] Itching and Other (See Comments)    BLISTERS    MEDICATIONS: Current Outpatient Medications on File Prior to Visit  Medication Sig Dispense Refill  . Cholecalciferol (VITAMIN D-3) 5000 UNIT/ML LIQD Place under the tongue.    Lauren James Kitchen glucose blood (IGLUCOSE TEST STRIPS) test strip Twice daily (use Bayer brand or whichever ins covers) 100 each 0  . JARDIANCE 25 MG TABS tablet Take 25 mg by mouth daily.  2  . metFORMIN (GLUCOPHAGE) 500 MG tablet Take 500 mg by mouth daily with breakfast.    . Multiple Vitamins-Minerals (CENTRUM SILVER 50+WOMEN PO) Take by mouth.    . Omega-3 Fatty Acids (OMEGA-3 FISH OIL PO) Take 1 capsule by mouth daily. Omega  Red Fish Oil    . rosuvastatin (CRESTOR) 5 MG tablet Take 5 mg by mouth every evening.  1  . tiZANidine (ZANAFLEX) 2 MG tablet Take 2 mg by mouth daily as needed.  1   No current facility-administered medications on file prior to visit.     PAST MEDICAL HISTORY: Past Medical History:  Diagnosis Date  . Atypical chest pain 04/12/2017  . Back pain   . Diabetes mellitus type 2 in obese ([place]) 04/12/2017  . Dysuria   . Frequency of urination   . GERD (gastroesophageal reflux disease)    WATCHES DIET  . Hematuria   . History of kidney problems   . History of kidney stones   . Hyperlipidemia 04/12/2017  . Nocturia   . Prediabetes    DIET CONTROLLED  . Right ureteral stone   . Urgency of urination   . Vitamin D deficiency   . Wears glasses     PAST SURGICAL HISTORY: Past Surgical History:  Procedure Laterality Date  . CHOLECYSTECTOMY  2000  (APPROX)  . CYSTOSCOPY W/ URETERAL STENT PLACEMENT Right 12/13/2012   Procedure: CYSTOSCOPY/RIGHT URETERAL STENT EXCHANGE/RIGHT RETROGRADE PYELOGRAM;  Surgeon: [name], MD;  Location[facility];  Service: Urology;  Laterality: Right;  . CYSTOSCOPY WITH URETEROSCOPY Right 11/27/2012   Procedure: CYSTOSCOPY  WITH RIGHT  URETEROSCOPY AND STENT PLACEMENT ;  Surgeon: [name], MD;  Location: [facility];  Service: Urology;  Laterality: Right;  . [place]  (APPROX)  . HOLMIUM LASER APPLICATION Right 08/28/8451   Procedure: HOLMIUM LASER APPLICATION;  Surgeon: [name], MD;  Location: [facility];  Service: Urology;  Laterality: Right;  . HOLMIUM LASER APPLICATION Right 6/46/8032   Procedure: HOLMIUM LASER APPLICATION;  Surgeon: [name], MD;  Location: [facility];  Service: Urology;  Laterality: Right;  . URETEROSCOPY Right 12/13/2012   Procedure: URETEROSCOPY;  Surgeon: [name], MD;  Location: [facility];  Service: Urology;  Laterality: Right;    SOCIAL HISTORY: Social History   Tobacco Use  . Smoking status: Never Smoker  . Smokeless tobacco: Never Used  Substance Use Topics  . Alcohol use: No  . Drug use: No    FAMILY HISTORY: Family History  Problem Relation Age of Onset  . Diabetes Mother   . Kidney disease Mother   . Heart disease Mother   . High blood pressure Mother   . Stroke Mother   . Obesity Mother   .  Dementia Father   . Stroke Father   . Heart attack Sister   . Diabetes Sister   . Diabetes Maternal Grandmother   . Heart attack Maternal Grandfather   . Alcoholism Paternal Grandfather   . Diabetes Sister   . Osteoporosis Sister     ROS: Review of Systems  Constitutional: Positive for weight loss.  Musculoskeletal: Negative for myalgias.    PHYSICAL EXAM: Blood pressure 119/60, pulse 66, temperature 98.3 F (36.8 C), temperature source Oral, height 5\' 5"  (1.651 m), weight 164 lb (74.4 kg), SpO2 96 %. Body mass index is 27.29 kg/m. Physical Exam Vitals signs reviewed.  Constitutional:      Appearance: Normal appearance. She is well-developed. She is obese.  Cardiovascular:     Rate and Rhythm: Normal rate.  Pulmonary:     Effort: Pulmonary effort is normal.  Musculoskeletal:  Normal range of motion.  Skin:    General: Skin is warm and dry.  Neurological:     Mental Status: She is alert and oriented to person, place, and time.  Psychiatric:        Mood and Affect: Mood normal.        Behavior: Behavior normal.     RECENT LABS AND TESTS: BMET    Component Value Date/Time   NA 138 12/15/2012 2350   K 3.8 12/15/2012 2350   CL 102 12/15/2012 2350   CO2 22 12/15/2012 2350   GLUCOSE 220 (H) 12/15/2012 2350   BUN 13 12/15/2012 2350   CREATININE 0.78 12/15/2012 2350   CALCIUM 8.7 12/15/2012 2350   GFRNONAA 88 (L) 12/15/2012 2350   GFRAA >90 12/15/2012 2350   No results found for: HGBA1C Lab Results  Component Value Date   INSULIN 27.7 (H) 02/20/2018   CBC    Component Value Date/Time   WBC 13.1 (H) 12/15/2012 2320   RBC 4.69 12/15/2012 2320   HGB 13.7 12/15/2012 2320   HCT 39.5 12/15/2012 2320   PLT 215 12/15/2012 2320   MCV 84.2 12/15/2012 2320   MCH 29.2 12/15/2012 2320   MCHC 34.7 12/15/2012 2320   RDW 12.9 12/15/2012 2320   LYMPHSABS 3.1 12/15/2012 2320   MONOABS 0.9 12/15/2012 2320   EOSABS 0.1 12/15/2012 2320   BASOSABS 0.0 12/15/2012 2320   Iron/TIBC/Ferritin/ %Sat No results found for: IRON, TIBC, FERRITIN, IRONPCTSAT Lipid Panel  No results found for: CHOL, TRIG, HDL, CHOLHDL, VLDL, LDLCALC, LDLDIRECT Hepatic Function Panel     Component Value Date/Time   PROT 6.6 12/15/2012 2350   ALBUMIN 3.6 12/15/2012 2350   AST 20 12/15/2012 2350   ALT 31 12/15/2012 2350   ALKPHOS 64 12/15/2012 2350   BILITOT 0.3 12/15/2012 2350   No results found for: TSH   Ref. Range 02/20/2018 11:46  Vitamin D, 25-Hydroxy Latest Ref Range: 30.0 - 100.0 ng/mL 43.2     OBESITY BEHAVIORAL INTERVENTION VISIT  Today's visit was # 4   Starting weight: 174 lbs Starting date: 02/20/2018 Today's weight : 164 lbs Today's date: 04/11/2018 Total lbs lost to date: 10 At least 15 minutes were spent on discussing the following behavioral intervention  visit.   ASK: We discussed the diagnosis of obesity with Lauren James today and Lauren James agreed to give [place] permission to discuss obesity behavioral modification therapy today.  ASSESS: Lauren James has the diagnosis of obesity and her BMI today is 27.29 Lauren James is in the action stage of change   ADVISE: Lauren James was educated on the multiple health risks of  obesity as well as the benefit of weight loss to improve her health. She was advised of the need for long term treatment and the importance of lifestyle modifications to improve her current health and to decrease her risk of future health problems.  AGREE: Multiple dietary modification options and treatment options were discussed and  Lauren James agreed to follow the recommendations documented in the above note.  ARRANGE: Nancylee was educated on the importance of frequent visits to treat obesity as outlined per CMS and USPSTF guidelines and agreed to schedule her next follow up appointment today.  Corey Skains, am acting as Location manager for [employer]. Owens Shark, DO  I have reviewed the above documentation for accuracy and completeness, and I agree with the above. -Jearld Lesch, DO

## 2018-04-24 ENCOUNTER — Ambulatory Visit (INDEPENDENT_AMBULATORY_CARE_PROVIDER_SITE_OTHER): Payer: PPO | Admitting: Bariatrics

## 2018-04-24 ENCOUNTER — Encounter (INDEPENDENT_AMBULATORY_CARE_PROVIDER_SITE_OTHER): Payer: Self-pay | Admitting: Bariatrics

## 2018-04-24 VITALS — BP 121/60 | HR 53 | Temp 97.7°F | Ht 65.0 in | Wt 161.0 lb

## 2018-04-24 DIAGNOSIS — E7849 Other hyperlipidemia: Secondary | ICD-10-CM | POA: Diagnosis not present

## 2018-04-24 DIAGNOSIS — E8881 Metabolic syndrome: Secondary | ICD-10-CM

## 2018-04-24 DIAGNOSIS — Z6830 Body mass index (BMI) 30.0-30.9, adult: Secondary | ICD-10-CM

## 2018-04-24 DIAGNOSIS — E559 Vitamin D deficiency, unspecified: Secondary | ICD-10-CM | POA: Diagnosis not present

## 2018-04-24 DIAGNOSIS — E669 Obesity, unspecified: Secondary | ICD-10-CM | POA: Diagnosis not present

## 2018-04-25 ENCOUNTER — Encounter (INDEPENDENT_AMBULATORY_CARE_PROVIDER_SITE_OTHER): Payer: Self-pay | Admitting: Bariatrics

## 2018-04-25 DIAGNOSIS — E88819 Insulin resistance, unspecified: Secondary | ICD-10-CM | POA: Insufficient documentation

## 2018-04-25 DIAGNOSIS — E8881 Metabolic syndrome: Secondary | ICD-10-CM | POA: Insufficient documentation

## 2018-04-25 LAB — INSULIN, RANDOM: INSULIN: 12.6 u[IU]/mL (ref 2.6–24.9)

## 2018-04-25 LAB — HEMOGLOBIN A1C
Est. average glucose Bld gHb Est-mCnc: 134 mg/dL
HEMOGLOBIN A1C: 6.3 % — AB (ref 4.8–5.6)

## 2018-04-25 LAB — COMPREHENSIVE METABOLIC PANEL
ALBUMIN: 4.5 g/dL (ref 3.8–4.8)
ALT: 39 IU/L — ABNORMAL HIGH (ref 0–32)
AST: 26 IU/L (ref 0–40)
Albumin/Globulin Ratio: 1.8 (ref 1.2–2.2)
Alkaline Phosphatase: 67 IU/L (ref 39–117)
BUN/Creatinine Ratio: 17 (ref 12–28)
BUN: 13 mg/dL (ref 8–27)
Bilirubin Total: 0.4 mg/dL (ref 0.0–1.2)
CO2: 21 mmol/L (ref 20–29)
CREATININE: 0.77 mg/dL (ref 0.57–1.00)
Calcium: 9.3 mg/dL (ref 8.7–10.3)
Chloride: 101 mmol/L (ref 96–106)
GFR calc Af Amer: 92 mL/min/{1.73_m2} (ref >59)
GFR calc non Af Amer: 80 mL/min/{1.73_m2} (ref >59)
Globulin, Total: 2.5 g/dL (ref 1.5–4.5)
Glucose: 93 mg/dL (ref 65–99)
Potassium: 4.5 mmol/L (ref 3.5–5.2)
Sodium: 141 mmol/L (ref 134–144)
Total Protein: 7 g/dL (ref 6.0–8.5)

## 2018-04-25 LAB — VITAMIN D 25 HYDROXY (VIT D DEFICIENCY, FRACTURES): Vit D, 25-Hydroxy: 62 ng/mL (ref 30.0–100.0)

## 2018-04-25 NOTE — Progress Notes (Signed)
Office: [phone]  /  Fax: [phone]   HPI:   Chief Complaint: OBESITY Lauren James is here to discuss her progress with her obesity treatment plan. She is on the Category 2 plan and is following her eating plan approximately 80 % of the time. She states she is exercising 0 minutes 0 times per week. Lauren James continues to do well. She does have some sweet cravings. Her weight is 161 lb (73 kg) today and has had a weight loss of 3 pounds over a period of 2 weeks since her last visit. She has lost 13 lbs since starting treatment with [place].  Insulin Resistance Lauren James has a diagnosis of insulin resistance based on her elevated fasting insulin level >5. Although Lauren James's blood glucose readings are still under good control, insulin resistance puts her at greater risk of metabolic syndrome and diabetes. Her last insulin level was at 27.7 She is taking metformin currently and continues to work on diet and exercise to decrease risk of diabetes. Lauren James admits polyphagia.  Hyperlipidemia Lauren James has hyperlipidemia and she is currently taking Omega 3-fish oil and Crestor. She has been trying to improve her cholesterol levels with intensive lifestyle modification including a low saturated fat diet, exercise and weight loss. She denies myalgias.  Vitamin D deficiency Lauren James has a diagnosis of vitamin D deficiency. She is currently taking vit D and denies nausea, vomiting or muscle weakness.  ASSESSMENT AND PLAN:  Insulin resistance - Plan: Hemoglobin A1c, Insulin, random, Comprehensive metabolic panel  Other hyperlipidemia - Plan: Comprehensive metabolic panel  Vitamin D deficiency - Plan: VITAMIN D 25 Hydroxy (Vit-D Deficiency, Fractures)  Class 1 obesity with serious comorbidity and body mass index (BMI) of 30.0 to 30.9 in adult, unspecified obesity type - Starting BMI greater then 30  PLAN:  Insulin Resistance Lauren James will continue to work on weight loss, exercise, and decreasing simple carbohydrates in her diet  to help decrease the risk of diabetes. We dicussed metformin including benefits and risks. She was informed that eating too many simple carbohydrates or too many calories at one sitting increases the likelihood of GI side effects. Darrien agreed to start taking metformin 500 mg once daily at lunch and follow up with [place] as directed to monitor her progress. We will check Hgb A1c and insulin level today.  Hyperlipidemia Lauren James was informed of the American Heart Association Guidelines emphasizing intensive lifestyle modifications as the first line treatment for hyperlipidemia. We discussed many lifestyle modifications today in depth, and Lauren James will continue to work on decreasing saturated fats such as fatty red meat, butter and many fried foods. She will also increase vegetables and lean protein in her diet and continue to work on exercise and weight loss efforts. Lauren James will continue her medications as prescribed and will follow up as directed.  Vitamin D Deficiency Lauren James was informed that low vitamin D levels contributes to fatigue and are associated with obesity, breast, and colon cancer. She agrees to continue to take prescription Vit D @50 ,000 IU every week and will follow up for routine testing of vitamin D, at least 2-3 times per year. She was informed of the risk of over-replacement of vitamin D and agrees to not increase her dose unless she discusses this with [place] first. We will check her vitamin D level today.  Obesity Lauren James is currently in the action stage of change. As such, her goal is to continue with weight loss efforts She has agreed to follow the Category 2 plan +100 calories Lauren James has  been instructed to work up to a goal of 150 minutes of combined cardio and strengthening exercise per week for weight loss and overall health benefits. We discussed the following Behavioral Modification Strategies today: increase H2O intake, keeping healthy foods in the home, increasing lean protein intake, decreasing  simple carbohydrates, increasing vegetables and work on meal planning and easy cooking plans  Lauren James has agreed to follow up with our clinic in 2 weeks. She was informed of the importance of frequent follow up visits to maximize her success with intensive lifestyle modifications for her multiple health conditions.  ALLERGIES: Allergies  Allergen Reactions  . Adhesive [Tape] Itching and Other (See Comments)    BLISTERS    MEDICATIONS: Current Outpatient Medications on File Prior to Visit  Medication Sig Dispense Refill  . Cholecalciferol (VITAMIN D-3) 5000 UNIT/ML LIQD Place under the tongue.    [name] glucose blood (IGLUCOSE TEST STRIPS) test strip Twice daily (use Bayer brand or whichever ins covers) 100 each 0  . JARDIANCE 25 MG TABS tablet Take 25 mg by mouth daily.  2  . metFORMIN (GLUCOPHAGE) 500 MG tablet Take 500 mg by mouth daily with breakfast.    . Multiple Vitamins-Minerals (CENTRUM SILVER 50+WOMEN PO) Take by mouth.    . Omega-3 Fatty Acids (OMEGA-3 FISH OIL PO) Take 1 capsule by mouth daily. Omega  Red Fish Oil    . rosuvastatin (CRESTOR) 5 MG tablet Take 5 mg by mouth every evening.  1  . tiZANidine (ZANAFLEX) 2 MG tablet Take 2 mg by mouth daily as needed.  1   No current facility-administered medications on file prior to visit.     PAST MEDICAL HISTORY: Past Medical History:  Diagnosis Date  . Atypical chest pain 04/12/2017  . Back pain   . Diabetes mellitus type 2 in obese ([place]) 04/12/2017  . Dysuria   . Frequency of urination   . GERD (gastroesophageal reflux disease)    WATCHES DIET  . Hematuria   . History of kidney problems   . History of kidney stones   . Hyperlipidemia 04/12/2017  . Nocturia   . Prediabetes    DIET CONTROLLED  . Right ureteral stone   . Urgency of urination   . Vitamin D deficiency   . Wears glasses     PAST SURGICAL HISTORY: Past Surgical History:  Procedure Laterality Date  . CHOLECYSTECTOMY  2000  (APPROX)  . CYSTOSCOPY W/ URETERAL  STENT PLACEMENT Right 12/13/2012   Procedure: CYSTOSCOPY/RIGHT URETERAL STENT EXCHANGE/RIGHT RETROGRADE PYELOGRAM;  Surgeon: [name], MD;  Location: [facility];  Service: Urology;  Laterality: Right;  . CYSTOSCOPY WITH URETEROSCOPY Right 11/27/2012   Procedure: CYSTOSCOPY WITH RIGHT  URETEROSCOPY AND STENT PLACEMENT ;  Surgeon: [name], MD;  Location: [facility];  Service: Urology;  Laterality: Right;  . [place]  (APPROX)  . HOLMIUM LASER APPLICATION Right 04/02/5100   Procedure: HOLMIUM LASER APPLICATION;  Surgeon: [name], MD;  Location: [facility];  Service: Urology;  Laterality: Right;  . HOLMIUM LASER APPLICATION Right 5/85/2778   Procedure: HOLMIUM LASER APPLICATION;  Surgeon: [name], MD;  Location: [facility];  Service: Urology;  Laterality: Right;  . URETEROSCOPY Right 12/13/2012   Procedure: URETEROSCOPY;  Surgeon: [name], MD;  Location: [facility];  Service: Urology;  Laterality: Right;    SOCIAL HISTORY: Social History   Tobacco Use  . Smoking status: Never Smoker  . Smokeless  tobacco: Never Used  Substance Use Topics  . Alcohol use: No  . Drug use: No    FAMILY HISTORY: Family History  Problem Relation Age of Onset  . Diabetes Mother   . Kidney disease Mother   . Heart disease Mother   . High blood pressure Mother   . Stroke Mother   . Obesity Mother   . Dementia Father   . Stroke Father   . Heart attack Sister   . Diabetes Sister   . Diabetes Maternal Grandmother   . Heart attack Maternal Grandfather   . Alcoholism Paternal Grandfather   . Diabetes Sister   . Osteoporosis Sister     ROS: Review of Systems  Constitutional: Positive for weight loss.  Gastrointestinal: Negative for nausea and vomiting.  Musculoskeletal: Negative for myalgias.       Negative for muscle weakness  Endo/Heme/Allergies:       Positive for polyphagia    PHYSICAL  EXAM: Blood pressure 121/60, pulse (!) 53, temperature 97.7 F (36.5 C), temperature source Oral, height 5\' 5"  (1.651 m), weight 161 lb (73 kg), SpO2 98 %. Body mass index is 26.79 kg/m. Physical Exam Vitals signs reviewed.  Constitutional:      Appearance: Normal appearance. She is well-developed. She is obese.  Cardiovascular:     Rate and Rhythm: Normal rate.  Pulmonary:     Effort: Pulmonary effort is normal.  Musculoskeletal: Normal range of motion.  Skin:    General: Skin is warm and dry.  Neurological:     Mental Status: She is alert and oriented to person, place, and time.  Psychiatric:        Mood and Affect: Mood normal.        Behavior: Behavior normal.     RECENT LABS AND TESTS: BMET    Component Value Date/Time   NA 141 04/24/2018 1340   K 4.5 04/24/2018 1340   CL 101 04/24/2018 1340   CO2 21 04/24/2018 1340   GLUCOSE 93 04/24/2018 1340   GLUCOSE 220 (H) 12/15/2012 2350   BUN 13 04/24/2018 1340   CREATININE 0.77 04/24/2018 1340   CALCIUM 9.3 04/24/2018 1340   GFRNONAA 80 04/24/2018 1340   GFRAA 92 04/24/2018 1340   Lab Results  Component Value Date   HGBA1C 6.3 (H) 04/24/2018   Lab Results  Component Value Date   INSULIN 12.6 04/24/2018   INSULIN 27.7 (H) 02/20/2018   CBC    Component Value Date/Time   WBC 13.1 (H) 12/15/2012 2320   RBC 4.69 12/15/2012 2320   HGB 13.7 12/15/2012 2320   HCT 39.5 12/15/2012 2320   PLT 215 12/15/2012 2320   MCV 84.2 12/15/2012 2320   MCH 29.2 12/15/2012 2320   MCHC 34.7 12/15/2012 2320   RDW 12.9 12/15/2012 2320   LYMPHSABS 3.1 12/15/2012 2320   MONOABS 0.9 12/15/2012 2320   EOSABS 0.1 12/15/2012 2320   BASOSABS 0.0 12/15/2012 2320   Iron/TIBC/Ferritin/ %Sat No results found for: IRON, TIBC, FERRITIN, IRONPCTSAT Lipid Panel  No results found for: CHOL, TRIG, HDL, CHOLHDL, VLDL, LDLCALC, LDLDIRECT Hepatic Function Panel     Component Value Date/Time   PROT 7.0 04/24/2018 1340   ALBUMIN 4.5 04/24/2018  1340   AST 26 04/24/2018 1340   ALT 39 (H) 04/24/2018 1340   ALKPHOS 67 04/24/2018 1340   BILITOT 0.4 04/24/2018 1340   No results found for: TSH   Ref. Range 02/20/2018 11:46  Vitamin D, 25-Hydroxy Latest Ref Range: 30.0 -  100.0 ng/mL 43.2     OBESITY BEHAVIORAL INTERVENTION VISIT  Today's visit was # 5   Starting weight: 174 lbs Starting date: 02/20/2018 Today's weight : 161 lbs Today's date: 04/24/2018 Total lbs lost to date: 13 At least 15 minutes were spent on discussing the following behavioral intervention visit.   ASK: We discussed the diagnosis of obesity with Lauren James today and Lauren James agreed to give [place] permission to discuss obesity behavioral modification therapy today.  ASSESS: Lauren James has the diagnosis of obesity and her BMI today is 26.79 Carlota is in the action stage of change   ADVISE: Charlisa was educated on the multiple health risks of obesity as well as the benefit of weight loss to improve her health. She was advised of the need for long term treatment and the importance of lifestyle modifications to improve her current health and to decrease her risk of future health problems.  AGREE: Multiple dietary modification options and treatment options were discussed and  Lauren James agreed to follow the recommendations documented in the above note.  ARRANGE: Lauren James was educated on the importance of frequent visits to treat obesity as outlined per CMS and USPSTF guidelines and agreed to schedule her next follow up appointment today.  Corey Skains, am acting as Location manager for [employer]. Owens Shark, DO  I have reviewed the above documentation for accuracy and completeness, and I agree with the above. -Jearld Lesch, DO

## 2018-05-05 IMAGING — MR MR THORACIC SPINE W/O CM
4 of 11 series · 18 of 48 positions shown · non-contrast
Comparison: Chest radiographs 02/20/2017. Report of neck MRI
06/23/2001 (no images available).

CLINICAL DATA: 67-year-old female with pain in the middle the neck
radiating down the back. Symptoms began 6 months ago.. Bilateral arm
numbness, bilateral leg tingling. No known injury.

EXAM:
MRI CERVICAL AND THORACIC SPINE WITHOUT CONTRAST
TECHNIQUE: Multiplanar and multiecho pulse sequences of the cervical and
thoracic spine, were obtained without intravenous contrast.

[Series 19: T2 · sagittal · 3.0mm · 0.67mm/px · 3 of 15 slices shown (1 of 4)]
[im 1/15]
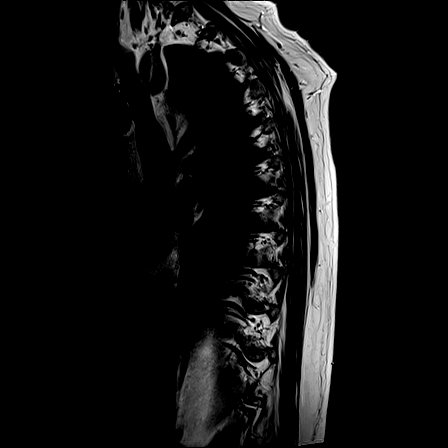
[im 8/15]
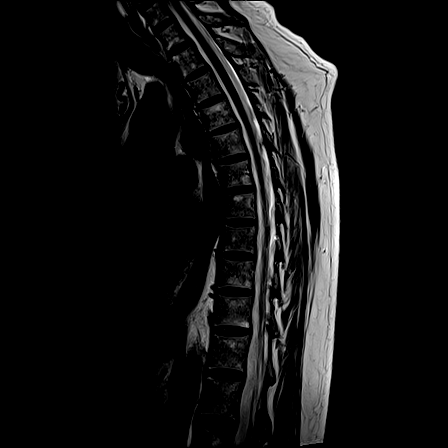
[im 15/15]
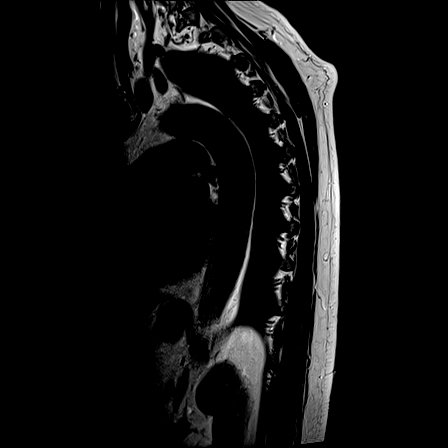

[Series 21: T2 · axial · 4.0mm · 0.28mm/px · z∈[-230,-13]mm · 8 of 39 slices shown (2 of 4)]
[im 1/39]
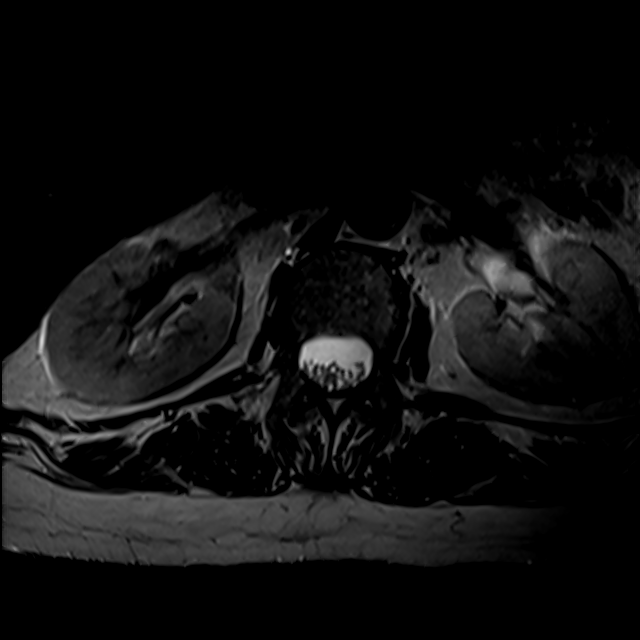
[im 6/39]
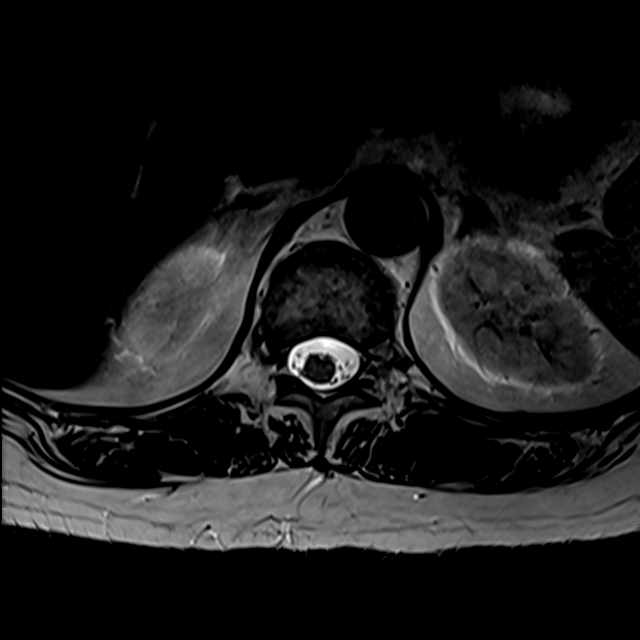
[im 11/39]
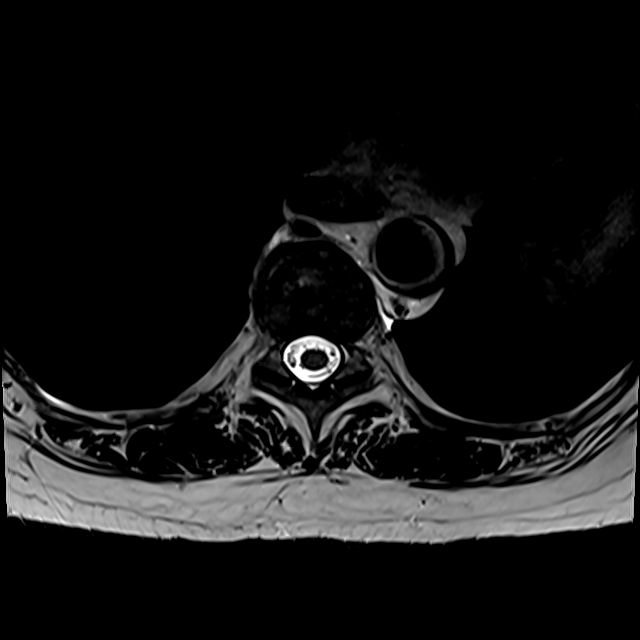
[im 17/39]
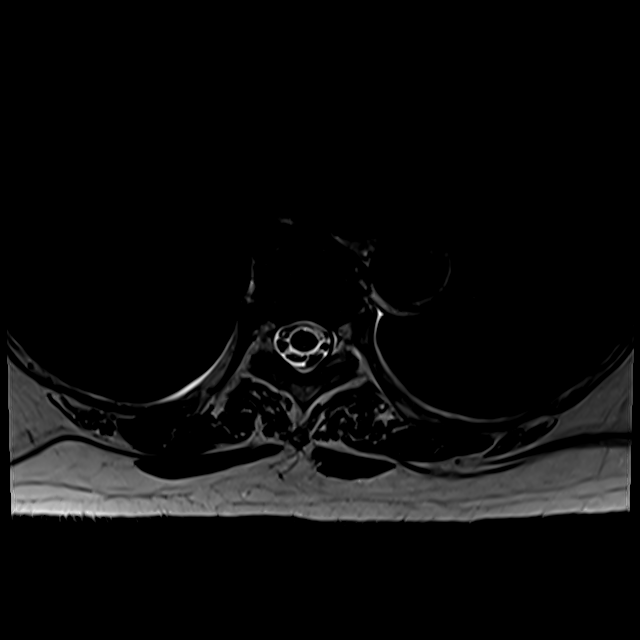
[im 22/39]
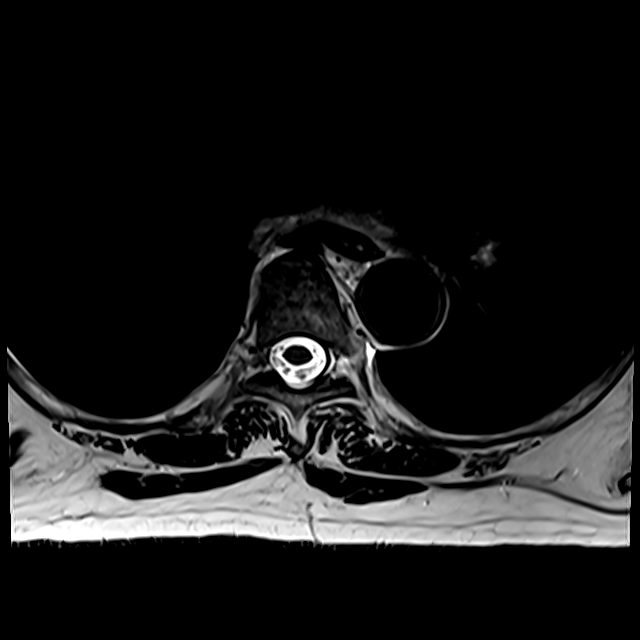
[im 28/39]
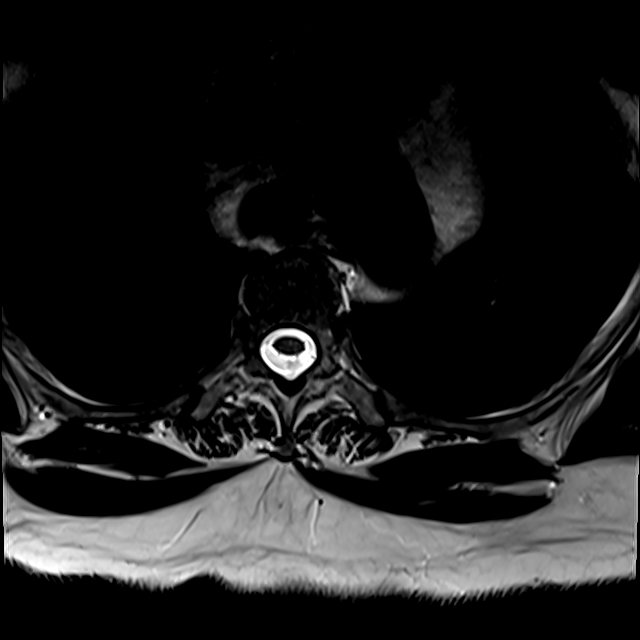
[im 33/39]
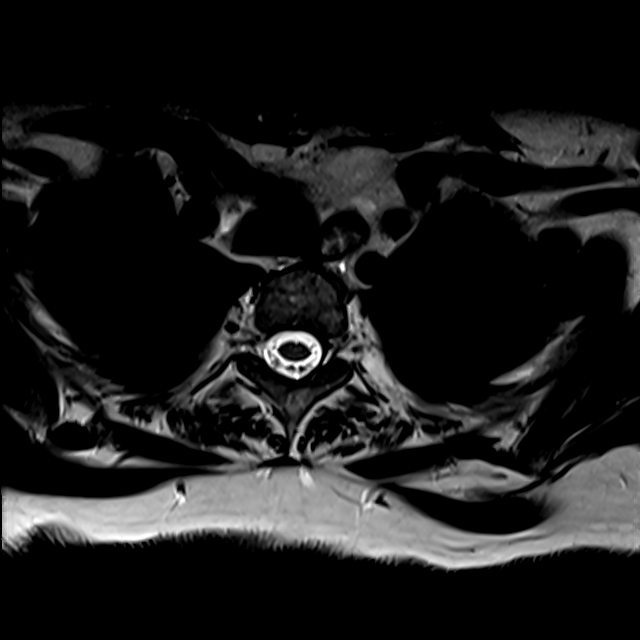
[im 39/39]
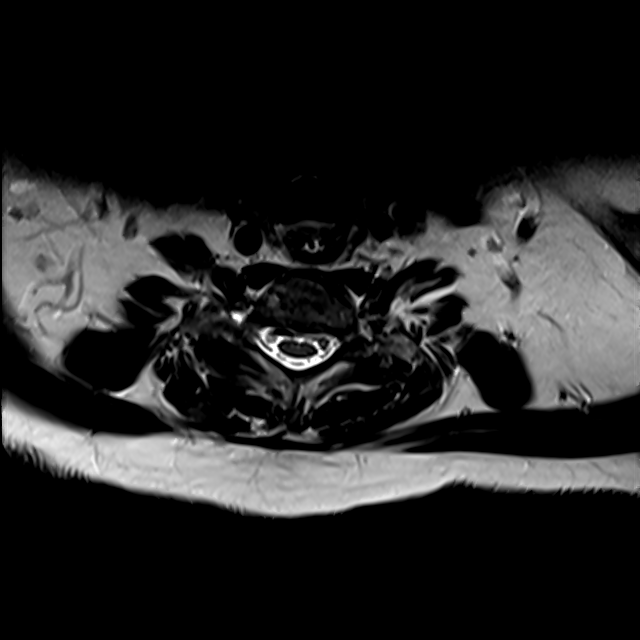

[Series 23: T2 · sagittal · 3.0mm · 0.55mm/px · 3 of 15 slices shown (3 of 4)]
[im 1/15]
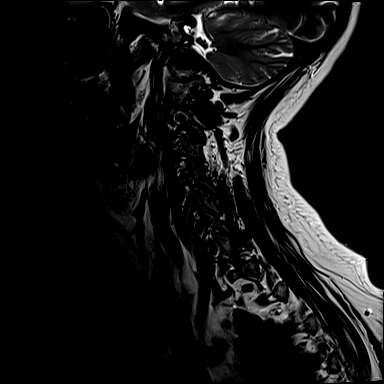
[im 8/15]
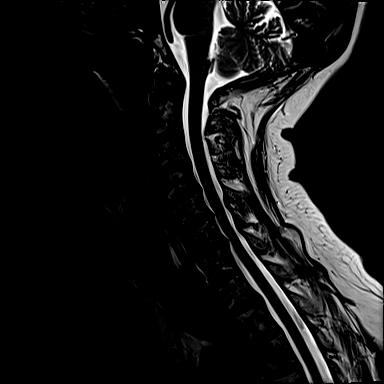
[im 15/15]
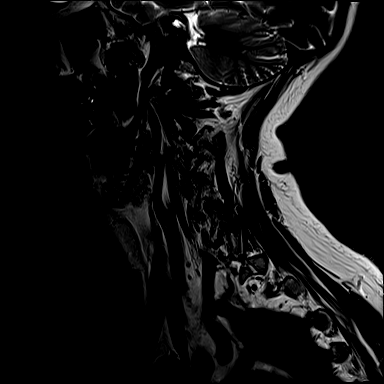

[Series 25: T2 · axial · 3.0mm · 0.50mm/px · z∈[-15,+81]mm · 4 of 33 slices shown (4 of 4)]
[im 1/33]
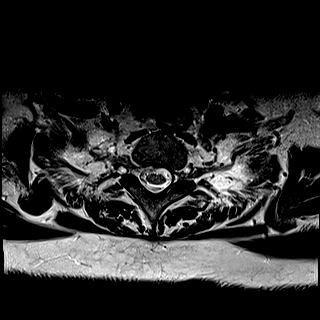
[im 7/33]
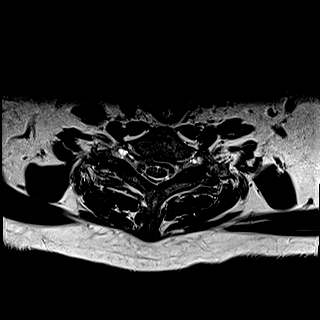
[im 20/33]
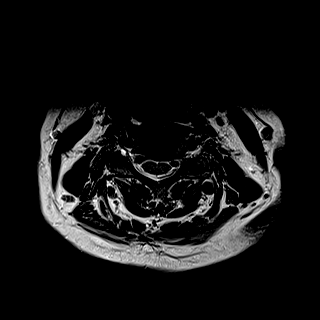
[im 33/33]
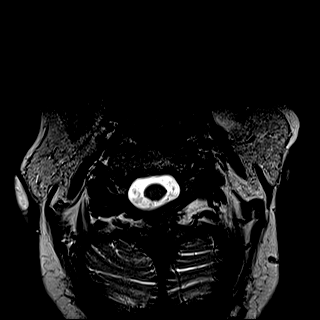

[18 of 48 positions shown; findings below may reference images not displayed]

FINDINGS: MRI CERVICAL SPINE FINDINGS

Alignment: Mild straightening of cervical lordosis.

Vertebrae: Mildly heterogeneous bone marrow signal throughout the
visible spine. No suspicious marrow lesion identified. No marrow
edema or evidence of acute osseous abnormality.

Cord: Spinal cord signal is within normal limits at all visualized
levels.

Posterior Fossa, vertebral arteries, paraspinal tissues: Negative
visualized brain parenchyma. Preserved major vascular flow voids in
the neck. Partially retropharyngeal course of the carotids. Negative
neck soft tissues and visible lung apices.

Disc levels:

C2-C3:  Negative.

C3-C4: Mild to moderate left facet hypertrophy. Minimal disc bulge
or tiny central disc protrusion in the midline. Borderline to mild
left C4 foraminal stenosis.

C4-C5: Mild circumferential disc bulge with broad-based mild central
posterior component of disc (series 26, image 15). Mild endplate and
facet hypertrophy. No spinal stenosis. Borderline to mild left C5
foraminal stenosis.

C5-C6: Mild circumferential disc bulge and endplate spurring with a
broad-based posterior component. Mild to moderate ligament flavum
hypertrophy. Spinal stenosis with mild cord mass. Moderate left and
mild right C6 foraminal stenosis.

C6-C7: Disc space loss with circumferential disc bulge and endplate
spurring. Broad-based posterior and biforaminal involvement. Mild to
moderate ligament flavum hypertrophy. Spinal stenosis with mild
spinal cord mass effect. Moderate to severe left and mild right C7
foraminal stenosis. Small bilateral nerve root diverticula, normal
variant.

C7-T1: Mild to moderate bilateral facet hypertrophy and foraminal
endplate spurring. No spinal stenosis. Borderline to mild C8
foraminal stenosis, greater on the left.

MRI THORACIC SPINE FINDINGS

Thoracic spine segmentation:  Absent or hypoplastic ribs at T12.

Alignment: Normal vertebral height and alignment which appears
stable since January 2017.

Vertebrae: Mild bone marrow heterogeneity similar to that in the
cervical spine. No marrow edema or evidence of acute osseous
abnormality.

Cord: Spinal cord signal is within normal limits at all visualized
levels. Capacious thoracic spinal canal. The conus medullaris
appears normal at T12-L1.

Paraspinal and other soft tissues: Negative visualized thoracic and
upper abdominal viscera. Negative visualized posterior paraspinal
soft tissues.

Disc levels:

T1-T2: Small central disc protrusion. No associated stenosis. Small
bilateral T1 nerve root diverticula, normal variant.

T2-T3: Negative.

T3-T4: Negative.

T4-T5: Mild disc bulging. Broad-based posterior component. No
stenosis.

T5-T6: Negative.

T6-T7: Negative.

T7-T8: Negative.

T8-T9: Negative.

T9-T10: Negative.

T10-T11: Negative.

T11-T12: Negative.

T12-L1: Negative.
IMPRESSION: 1.  No acute osseous abnormality in the cervical or thoracic spine.
2. Cervical spine degeneration from C4-C5 to C6-C7. Multifactorial
spinal stenosis at C5-C6 and C6-C7 with up to mild spinal cord mass
effect. No cord signal abnormality. Associated moderate to severe
left C6 and C7 neural foraminal stenosis.
3. Little thoracic spine degeneration. No thoracic spinal stenosis
or neural impingement.

## 2018-05-13 ENCOUNTER — Ambulatory Visit (INDEPENDENT_AMBULATORY_CARE_PROVIDER_SITE_OTHER): Payer: PPO | Admitting: Bariatrics

## 2018-05-13 ENCOUNTER — Encounter (INDEPENDENT_AMBULATORY_CARE_PROVIDER_SITE_OTHER): Payer: Self-pay | Admitting: Bariatrics

## 2018-05-13 VITALS — BP 119/68 | HR 58 | Temp 97.7°F | Ht 65.0 in | Wt 159.0 lb

## 2018-05-13 DIAGNOSIS — E669 Obesity, unspecified: Secondary | ICD-10-CM | POA: Diagnosis not present

## 2018-05-13 DIAGNOSIS — E8881 Metabolic syndrome: Secondary | ICD-10-CM | POA: Diagnosis not present

## 2018-05-13 DIAGNOSIS — E559 Vitamin D deficiency, unspecified: Secondary | ICD-10-CM | POA: Diagnosis not present

## 2018-05-13 DIAGNOSIS — Z6830 Body mass index (BMI) 30.0-30.9, adult: Secondary | ICD-10-CM

## 2018-05-13 DIAGNOSIS — E7849 Other hyperlipidemia: Secondary | ICD-10-CM

## 2018-05-13 NOTE — Progress Notes (Signed)
Office: [phone]  /  Fax: [phone]   HPI:   Chief Complaint: OBESITY Keeli is here to discuss her progress with her obesity treatment plan. She is on the Category 2 plan +100 calories and is following her eating plan approximately 70 % of the time. She states she is exercising 0 minutes 0 times per week. Ajah is doing well overall. Gaylynn had some bad days and she did some depression eating. Her weight is 159 lb (72.1 kg) today and has had a weight loss of 2 pounds over a period of 2 to 3 weeks since her last visit. She has lost 15 lbs since starting treatment with [place].  Insulin Resistance Zandra has a diagnosis of insulin resistance based on her elevated fasting insulin level >5. Although Vendetta's blood glucose readings are still under good control, insulin resistance puts her at greater risk of metabolic syndrome and diabetes. Her last A1c was at 6.3 and last insulin level was at 12.6 She is taking metformin and Jardiance. Jairy continues to work on diet and exercise to decrease risk of diabetes.  Hyperlipidemia Romana has hyperlipidemia and she is currently taking Crestor. She has been trying to improve her cholesterol levels with intensive lifestyle modification including a low saturated fat diet, exercise and weight loss. She denies myalgias.  Vitamin D deficiency Lynore has a diagnosis of vitamin D deficiency. Her last vitamin D level was at 62.0. She is taking high dose vit D (5,000 IU) daily. She denies nausea, vomiting or muscle weakness.  ASSESSMENT AND PLAN:  Insulin resistance  Other hyperlipidemia - Plan: Lipid Panel With LDL/HDL Ratio  Vitamin D deficiency  Class 1 obesity with serious comorbidity and body mass index (BMI) of 30.0 to 30.9 in adult, unspecified obesity type - Starting BMI greater then 30  PLAN:  Insulin Resistance Hopie will continue to work on weight loss, exercise, and decreasing simple carbohydrates in her diet to help decrease the risk of diabetes. We  dicussed metformin including benefits and risks. She was informed that eating too many simple carbohydrates or too many calories at one sitting increases the likelihood of GI side effects. Taraann will continue her medications as prescribed and follow up with [place] as directed to monitor her progress.  Hyperlipidemia Rhiley was informed of the American Heart Association Guidelines emphasizing intensive lifestyle modifications as the first line treatment for hyperlipidemia. We discussed many lifestyle modifications today in depth, and Roshanda will continue to work on decreasing saturated fats such as fatty red meat, butter and many fried foods. She will also increase vegetables and lean protein in her diet and continue to work on exercise and weight loss efforts. We will check lipids today and Ellyse will follow up at the agreed upon time.  Vitamin D Deficiency Nashiya was informed that low vitamin D levels contributes to fatigue and are associated with obesity, breast, and colon cancer. She agrees to stop high dose Vit D @5 ,000 IU daily and begin vitamin D 2,000 IU daily. Johonna will follow up for routine testing of vitamin D, at least 2-3 times per year. She was informed of the risk of over-replacement of vitamin D and agrees to not increase her dose unless she discusses this with [place] first. Aalaiyah agrees to follow up as directed.  Obesity Bobbye is currently in the action stage of change. As such, her goal is to continue with weight loss efforts She has agreed to follow the Category 2 plan +100 calories Kennice has been instructed to  work up to a goal of 150 minutes of combined cardio and strengthening exercise per week for weight loss and overall health benefits. We discussed the following Behavioral Modification Strategies today: increase H2O intake, no skipping meals, keeping healthy foods in the home, increasing lean protein intake, decreasing simple carbohydrates, increasing vegetables, decrease eating out and work on  meal planning and easy cooking plans Additional lunch options were provided to patient today.  Clydene has agreed to follow up with our clinic in 2 weeks. She was informed of the importance of frequent follow up visits to maximize her success with intensive lifestyle modifications for her multiple health conditions.  ALLERGIES: Allergies  Allergen Reactions  . Adhesive [Tape] Itching and Other (See Comments)    BLISTERS    MEDICATIONS: Current Outpatient Medications on File Prior to Visit  Medication Sig Dispense Refill  . Cholecalciferol (VITAMIN D-3) 5000 UNIT/ML LIQD Place under the tongue.    [name] glucose blood (IGLUCOSE TEST STRIPS) test strip Twice daily (use Bayer brand or whichever ins covers) 100 each 0  . JARDIANCE 25 MG TABS tablet Take 25 mg by mouth daily.  2  . metFORMIN (GLUCOPHAGE) 500 MG tablet Take 500 mg by mouth daily with breakfast.    . Multiple Vitamins-Minerals (CENTRUM SILVER 50+WOMEN PO) Take by mouth.    . Omega-3 Fatty Acids (OMEGA-3 FISH OIL PO) Take 1 capsule by mouth daily. Omega  Red Fish Oil    . rosuvastatin (CRESTOR) 5 MG tablet Take 5 mg by mouth every evening.  1  . tiZANidine (ZANAFLEX) 2 MG tablet Take 2 mg by mouth daily as needed.  1   No current facility-administered medications on file prior to visit.     PAST MEDICAL HISTORY: Past Medical History:  Diagnosis Date  . Atypical chest pain 04/12/2017  . Back pain   . Diabetes mellitus type 2 in obese ([place]) 04/12/2017  . Dysuria   . Frequency of urination   . GERD (gastroesophageal reflux disease)    WATCHES DIET  . Hematuria   . History of kidney problems   . History of kidney stones   . Hyperlipidemia 04/12/2017  . Nocturia   . Prediabetes    DIET CONTROLLED  . Right ureteral stone   . Urgency of urination   . Vitamin D deficiency   . Wears glasses     PAST SURGICAL HISTORY: Past Surgical History:  Procedure Laterality Date  . CHOLECYSTECTOMY  2000  (APPROX)  . CYSTOSCOPY W/  URETERAL STENT PLACEMENT Right 12/13/2012   Procedure: CYSTOSCOPY/RIGHT URETERAL STENT EXCHANGE/RIGHT RETROGRADE PYELOGRAM;  Surgeon: [name], MD;  Location: [facility];  Service: Urology;  Laterality: Right;  . CYSTOSCOPY WITH URETEROSCOPY Right 11/27/2012   Procedure: CYSTOSCOPY WITH RIGHT  URETEROSCOPY AND STENT PLACEMENT ;  Surgeon: [name], MD;  Location: [facility];  Service: Urology;  Laterality: Right;  . [place]  (APPROX)  . HOLMIUM LASER APPLICATION Right 09/25/945   Procedure: HOLMIUM LASER APPLICATION;  Surgeon: [name], MD;  Location: [facility];  Service: Urology;  Laterality: Right;  . HOLMIUM LASER APPLICATION Right 0/96/2836   Procedure: HOLMIUM LASER APPLICATION;  Surgeon: [name], MD;  Location: [facility];  Service: Urology;  Laterality: Right;  . URETEROSCOPY Right 12/13/2012   Procedure: URETEROSCOPY;  Surgeon: [name], MD;  Location: [facility];  Service: Urology;  Laterality: Right;    SOCIAL HISTORY: Social History  Tobacco Use  . Smoking status: Never Smoker  . Smokeless tobacco: Never Used  Substance Use Topics  . Alcohol use: No  . Drug use: No    FAMILY HISTORY: Family History  Problem Relation Age of Onset  . Diabetes Mother   . Kidney disease Mother   . Heart disease Mother   . High blood pressure Mother   . Stroke Mother   . Obesity Mother   . Dementia Father   . Stroke Father   . Heart attack Sister   . Diabetes Sister   . Diabetes Maternal Grandmother   . Heart attack Maternal Grandfather   . Alcoholism Paternal Grandfather   . Diabetes Sister   . Osteoporosis Sister     ROS: Review of Systems  Constitutional: Positive for weight loss.  Gastrointestinal: Negative for nausea and vomiting.  Musculoskeletal: Negative for myalgias.       Negative for muscle weakness    PHYSICAL EXAM: Blood pressure 119/68, pulse (!) 58,  temperature 97.7 F (36.5 C), temperature source Oral, height 5\' 5"  (1.651 m), weight 159 lb (72.1 kg), SpO2 97 %. Body mass index is 26.46 kg/m. Physical Exam Vitals signs reviewed.  Constitutional:      Appearance: Normal appearance. She is well-developed. She is obese.  Cardiovascular:     Rate and Rhythm: Normal rate.  Pulmonary:     Effort: Pulmonary effort is normal.  Musculoskeletal: Normal range of motion.  Skin:    General: Skin is warm and dry.  Neurological:     Mental Status: She is alert and oriented to person, place, and time.  Psychiatric:        Mood and Affect: Mood normal.        Behavior: Behavior normal.     RECENT LABS AND TESTS: BMET    Component Value Date/Time   NA 141 04/24/2018 1340   K 4.5 04/24/2018 1340   CL 101 04/24/2018 1340   CO2 21 04/24/2018 1340   GLUCOSE 93 04/24/2018 1340   GLUCOSE 220 (H) 12/15/2012 2350   BUN 13 04/24/2018 1340   CREATININE 0.77 04/24/2018 1340   CALCIUM 9.3 04/24/2018 1340   GFRNONAA 80 04/24/2018 1340   GFRAA 92 04/24/2018 1340   Lab Results  Component Value Date   HGBA1C 6.3 (H) 04/24/2018   Lab Results  Component Value Date   INSULIN 12.6 04/24/2018   INSULIN 27.7 (H) 02/20/2018   CBC    Component Value Date/Time   WBC 13.1 (H) 12/15/2012 2320   RBC 4.69 12/15/2012 2320   HGB 13.7 12/15/2012 2320   HCT 39.5 12/15/2012 2320   PLT 215 12/15/2012 2320   MCV 84.2 12/15/2012 2320   MCH 29.2 12/15/2012 2320   MCHC 34.7 12/15/2012 2320   RDW 12.9 12/15/2012 2320   LYMPHSABS 3.1 12/15/2012 2320   MONOABS 0.9 12/15/2012 2320   EOSABS 0.1 12/15/2012 2320   BASOSABS 0.0 12/15/2012 2320   Iron/TIBC/Ferritin/ %Sat No results found for: IRON, TIBC, FERRITIN, IRONPCTSAT Lipid Panel  No results found for: CHOL, TRIG, HDL, CHOLHDL, VLDL, LDLCALC, LDLDIRECT Hepatic Function Panel     Component Value Date/Time   PROT 7.0 04/24/2018 1340   ALBUMIN 4.5 04/24/2018 1340   AST 26 04/24/2018 1340   ALT 39  (H) 04/24/2018 1340   ALKPHOS 67 04/24/2018 1340   BILITOT 0.4 04/24/2018 1340   No results found for: TSH   Ref. Range 04/24/2018 13:40  Vitamin D, 25-Hydroxy Latest Ref Range: 30.0 -  100.0 ng/mL 62.0     OBESITY BEHAVIORAL INTERVENTION VISIT  Today's visit was # 6   Starting weight: 174 lbs Starting date: 02/20/2018 Today's weight : 159 lbs  Today's date: 05/13/2018 Total lbs lost to date: 15 At least 15 minutes were spent on discussing the following behavioral intervention visit.   ASK: We discussed the diagnosis of obesity with Cher Nakai today and Mishawn agreed to give [place] permission to discuss obesity behavioral modification therapy today.  ASSESS: Sheriece has the diagnosis of obesity and her BMI today is 26.46 Lyric is in the action stage of change   ADVISE: Alajia was educated on the multiple health risks of obesity as well as the benefit of weight loss to improve her health. She was advised of the need for long term treatment and the importance of lifestyle modifications to improve her current health and to decrease her risk of future health problems.  AGREE: Multiple dietary modification options and treatment options were discussed and  Kimiko agreed to follow the recommendations documented in the above note.  ARRANGE: Adrieana was educated on the importance of frequent visits to treat obesity as outlined per CMS and USPSTF guidelines and agreed to schedule her next follow up appointment today.  Corey Skains, am acting as Location manager for [employer]. Owens Shark, DO  I have reviewed the above documentation for accuracy and completeness, and I agree with the above. -Jearld Lesch, DO

## 2018-05-14 LAB — LIPID PANEL WITH LDL/HDL RATIO
Cholesterol, Total: 109 mg/dL (ref 100–199)
HDL: 41 mg/dL (ref >39)
LDL Calculated: 36 mg/dL (ref 0–99)
LDL/HDL RATIO: 0.9 ratio (ref 0.0–3.2)
TRIGLYCERIDES: 161 mg/dL — AB (ref 0–149)
VLDL Cholesterol Cal: 32 mg/dL (ref 5–40)

## 2018-05-29 ENCOUNTER — Encounter (INDEPENDENT_AMBULATORY_CARE_PROVIDER_SITE_OTHER): Payer: Self-pay | Admitting: Bariatrics

## 2018-05-29 ENCOUNTER — Ambulatory Visit (INDEPENDENT_AMBULATORY_CARE_PROVIDER_SITE_OTHER): Payer: PPO | Admitting: Bariatrics

## 2018-05-29 VITALS — BP 119/66 | HR 66 | Temp 97.9°F | Ht 65.0 in | Wt 159.0 lb

## 2018-05-29 DIAGNOSIS — E8881 Metabolic syndrome: Secondary | ICD-10-CM

## 2018-05-29 DIAGNOSIS — E669 Obesity, unspecified: Secondary | ICD-10-CM

## 2018-05-29 DIAGNOSIS — E7849 Other hyperlipidemia: Secondary | ICD-10-CM

## 2018-05-29 DIAGNOSIS — Z6830 Body mass index (BMI) 30.0-30.9, adult: Secondary | ICD-10-CM | POA: Diagnosis not present

## 2018-05-29 NOTE — Progress Notes (Signed)
Office: [phone]  /  Fax: [phone]   HPI:   Chief Complaint: OBESITY Lauren James is here to discuss her progress with her obesity treatment plan. She is on the Category 2 plan +100 calories and is following her eating plan approximately 70 % of the time. She states she is exercising 0 minutes 0 times per week. Lauren James has been doing well overall. She has been traveling and eating out more. Her weight is 159 lb (72.1 kg) today and she has maintained her weight over a period of 2 weeks since her last visit. She has lost 15 lbs since starting treatment with [place].  Insulin Resistance Lauren James has a diagnosis of insulin resistance based on her elevated fasting insulin level >5. Although Lauren James's blood glucose readings are still under good control, insulin resistance puts her at greater risk of metabolic syndrome and diabetes. She is taking metformin currently and continues to work on diet and exercise to decrease risk of diabetes. She denies polyphagia.  Hyperlipidemia Lauren James has hyperlipidemia and she is taking Crestor and Omega 3. She has been trying to improve her cholesterol levels with intensive lifestyle modification including a low saturated fat diet, exercise and weight loss. She denies myalgias.  ASSESSMENT AND PLAN:  Insulin resistance  Other hyperlipidemia  Class 1 obesity with serious comorbidity and body mass index (BMI) of 30.0 to 30.9 in adult, unspecified obesity type  PLAN:  Insulin Resistance Lauren James will continue to work on weight loss, exercise, and decreasing simple carbohydrates in her diet to help decrease the risk of diabetes. She was informed that eating too many simple carbohydrates or too many calories at one sitting increases the likelihood of GI side effects. Lauren James will continue metformin and Jardiance. Lauren James agreed to follow up with [place] as directed to monitor her progress.  Hyperlipidemia Lauren James was informed of the American Heart Association Guidelines emphasizing intensive  lifestyle modifications as the first line treatment for hyperlipidemia. We discussed many lifestyle modifications today in depth, and Lauren James will continue to work on decreasing saturated fats such as fatty red meat, butter and many fried foods. She will also increase vegetables and lean protein in her diet and continue to work on exercise and weight loss efforts. Lauren James will continue Crestor and follow up as directed.  Obesity Lauren James is currently in the action stage of change. As such, her goal is to continue with weight loss efforts She has agreed to follow the Category 2 plan +100 calories Lauren James has been instructed to work up to a goal of 150 minutes of combined cardio and strengthening exercise per week for weight loss and overall health benefits. We discussed the following Behavioral Modification Strategies today: increasing lean protein intake, decreasing simple carbohydrates, increasing vegetables, decrease fruit, increase H2O intake, decrease eating out, no skipping meals, meal planning and cooking strategies and keeping healthy foods in the home Smart fruit choices and dinner ideas were given to patient today.  Lauren James has agreed to follow up with our clinic in 2 weeks. She was informed of the importance of frequent follow up visits to maximize her success with intensive lifestyle modifications for her multiple health conditions.  ALLERGIES: Allergies  Allergen Reactions  . Adhesive [Tape] Itching and Other (See Comments)    BLISTERS    MEDICATIONS: Current Outpatient Medications on File Prior to Visit  Medication Sig Dispense Refill  . Cholecalciferol (VITAMIN D-3) 5000 UNIT/ML LIQD Place under the tongue.    [name] glucose blood (IGLUCOSE TEST STRIPS) test strip Twice daily (  use Bayer brand or whichever ins covers) 100 each 0  . JARDIANCE 25 MG TABS tablet Take 25 mg by mouth daily.  2  . metFORMIN (GLUCOPHAGE) 500 MG tablet Take 500 mg by mouth daily with breakfast.    . Multiple  Vitamins-Minerals (CENTRUM SILVER 50+WOMEN PO) Take by mouth.    . Omega-3 Fatty Acids (OMEGA-3 FISH OIL PO) Take 1 capsule by mouth daily. Omega  Red Fish Oil    . rosuvastatin (CRESTOR) 5 MG tablet Take 5 mg by mouth every evening.  1  . tiZANidine (ZANAFLEX) 2 MG tablet Take 2 mg by mouth daily as needed.  1   No current facility-administered medications on file prior to visit.     PAST MEDICAL HISTORY: Past Medical History:  Diagnosis Date  . Atypical chest pain 04/12/2017  . Back pain   . Diabetes mellitus type 2 in obese ([place]) 04/12/2017  . Dysuria   . Frequency of urination   . GERD (gastroesophageal reflux disease)    WATCHES DIET  . Hematuria   . History of kidney problems   . History of kidney stones   . Hyperlipidemia 04/12/2017  . Nocturia   . Prediabetes    DIET CONTROLLED  . Right ureteral stone   . Urgency of urination   . Vitamin D deficiency   . Wears glasses     PAST SURGICAL HISTORY: Past Surgical History:  Procedure Laterality Date  . CHOLECYSTECTOMY  2000  (APPROX)  . CYSTOSCOPY W/ URETERAL STENT PLACEMENT Right 12/13/2012   Procedure: CYSTOSCOPY/RIGHT URETERAL STENT EXCHANGE/RIGHT RETROGRADE PYELOGRAM;  Surgeon: [name], MD;  [facility];  Service: Urology;  Laterality: Right;  . CYSTOSCOPY WITH URETEROSCOPY Right 11/27/2012   Procedure: CYSTOSCOPY WITH RIGHT  URETEROSCOPY AND STENT PLACEMENT ;  Surgeon: [name], MD;  Location: [facility];  Service: Urology;  Laterality: Right;  . [place]  (APPROX)  . HOLMIUM LASER APPLICATION Right 5/0/0938   Procedure: HOLMIUM LASER APPLICATION;  Surgeon: [name], MD;  Location: [facility];  Service: Urology;  Laterality: Right;  . HOLMIUM LASER APPLICATION Right 1/82/9937   Procedure: HOLMIUM LASER APPLICATION;  Surgeon: [name], MD;  Location[facility];  Service: Urology;  Laterality: Right;  . URETEROSCOPY Right 12/13/2012    Procedure: URETEROSCOPY;  Surgeon: [name], MD;  Location: [facility];  Service: Urology;  Laterality: Right;    SOCIAL HISTORY: Social History   Tobacco Use  . Smoking status: Never Smoker  . Smokeless tobacco: Never Used  Substance Use Topics  . Alcohol use: No  . Drug use: No    FAMILY HISTORY: Family History  Problem Relation Age of Onset  . Diabetes Mother   . Kidney disease Mother   . Heart disease Mother   . High blood pressure Mother   . Stroke Mother   . Obesity Mother   . Dementia Father   . Stroke Father   . Heart attack Sister   . Diabetes Sister   . Diabetes Maternal Grandmother   . Heart attack Maternal Grandfather   . Alcoholism Paternal Grandfather   . Diabetes Sister   . Osteoporosis Sister     ROS: Review of Systems  Constitutional: Negative for weight loss.  Musculoskeletal: Negative for myalgias.  Endo/Heme/Allergies:       Negative for polyphagia    PHYSICAL EXAM: Blood pressure 119/66, pulse 66, temperature 97.9 F (36.6 C), temperature  source Oral, height 5\' 5"  (1.651 m), weight 159 lb (72.1 kg), SpO2 97 %. Body mass index is 26.46 kg/m. Physical Exam Vitals signs reviewed.  Constitutional:      Appearance: Normal appearance. She is well-developed. She is obese.  Cardiovascular:     Rate and Rhythm: Normal rate.  Pulmonary:     Effort: Pulmonary effort is normal.  Musculoskeletal: Normal range of motion.  Skin:    General: Skin is warm and dry.  Neurological:     Mental Status: She is alert and oriented to person, place, and time.  Psychiatric:        Mood and Affect: Mood normal.        Behavior: Behavior normal.     RECENT LABS AND TESTS: BMET    Component Value Date/Time   NA 141 04/24/2018 1340   K 4.5 04/24/2018 1340   CL 101 04/24/2018 1340   CO2 21 04/24/2018 1340   GLUCOSE 93 04/24/2018 1340   GLUCOSE 220 (H) 12/15/2012 2350   BUN 13 04/24/2018 1340   CREATININE 0.77 04/24/2018  1340   CALCIUM 9.3 04/24/2018 1340   GFRNONAA 80 04/24/2018 1340   GFRAA 92 04/24/2018 1340   Lab Results  Component Value Date   HGBA1C 6.3 (H) 04/24/2018   Lab Results  Component Value Date   INSULIN 12.6 04/24/2018   INSULIN 27.7 (H) 02/20/2018   CBC    Component Value Date/Time   WBC 13.1 (H) 12/15/2012 2320   RBC 4.69 12/15/2012 2320   HGB 13.7 12/15/2012 2320   HCT 39.5 12/15/2012 2320   PLT 215 12/15/2012 2320   MCV 84.2 12/15/2012 2320   MCH 29.2 12/15/2012 2320   MCHC 34.7 12/15/2012 2320   RDW 12.9 12/15/2012 2320   LYMPHSABS 3.1 12/15/2012 2320   MONOABS 0.9 12/15/2012 2320   EOSABS 0.1 12/15/2012 2320   BASOSABS 0.0 12/15/2012 2320   Iron/TIBC/Ferritin/ %Sat No results found for: IRON, TIBC, FERRITIN, IRONPCTSAT Lipid Panel     Component Value Date/Time   CHOL 109 05/13/2018 1515   TRIG 161 (H) 05/13/2018 1515   HDL 41 05/13/2018 1515   LDLCALC 36 05/13/2018 1515   Hepatic Function Panel     Component Value Date/Time   PROT 7.0 04/24/2018 1340   ALBUMIN 4.5 04/24/2018 1340   AST 26 04/24/2018 1340   ALT 39 (H) 04/24/2018 1340   ALKPHOS 67 04/24/2018 1340   BILITOT 0.4 04/24/2018 1340   No results found for: TSH Results for JANYLA, BISCOE (MRN [id number]) as of 05/29/2018 22:34  Ref. Range 04/24/2018 13:40  Vitamin D, 25-Hydroxy Latest Ref Range: 30.0 - 100.0 ng/mL 62.0     OBESITY BEHAVIORAL INTERVENTION VISIT  Today's visit was # 7   Starting weight: 174 lbs Starting date: 02/20/2018 Today's weight : 159 lbs Today's date: 05/29/2018 Total lbs lost to date: 15 At least 15 minutes were spent on discussing the following behavioral intervention visit.    05/29/2018  Height 5\' 5"  (1.651 m)  Weight 159 lb (72.1 kg)  BMI (Calculated) 26.46  BLOOD PRESSURE - SYSTOLIC 245  BLOOD PRESSURE - DIASTOLIC 66   Body Fat % 80.9 %  Total Body Water (lbs) 64 lbs    ASK: We discussed the diagnosis of obesity with Cher Nakai today and Alaisa agreed  to give [place] permission to discuss obesity behavioral modification therapy today.  ASSESS: Dannon has the diagnosis of obesity and her BMI today is 26.46 Alfrieda is in  the action stage of change   ADVISE: Sacora was educated on the multiple health risks of obesity as well as the benefit of weight loss to improve her health. She was advised of the need for long term treatment and the importance of lifestyle modifications to improve her current health and to decrease her risk of future health problems.  AGREE: Multiple dietary modification options and treatment options were discussed and  Summerlynn agreed to follow the recommendations documented in the above note.  ARRANGE: Tye was educated on the importance of frequent visits to treat obesity as outlined per CMS and USPSTF guidelines and agreed to schedule her next follow up appointment today.  Corey Skains, am acting as Location manager for [employer]. Owens Shark, DO  I have reviewed the above documentation for accuracy and completeness, and I agree with the above. -Jearld Lesch, DO

## 2018-06-06 DIAGNOSIS — E785 Hyperlipidemia, unspecified: Secondary | ICD-10-CM | POA: Diagnosis not present

## 2018-06-06 DIAGNOSIS — H9193 Unspecified hearing loss, bilateral: Secondary | ICD-10-CM | POA: Diagnosis not present

## 2018-06-06 DIAGNOSIS — E559 Vitamin D deficiency, unspecified: Secondary | ICD-10-CM | POA: Diagnosis not present

## 2018-06-06 DIAGNOSIS — R220 Localized swelling, mass and lump, head: Secondary | ICD-10-CM | POA: Diagnosis not present

## 2018-06-06 DIAGNOSIS — Z0000 Encounter for general adult medical examination without abnormal findings: Secondary | ICD-10-CM | POA: Diagnosis not present

## 2018-06-06 DIAGNOSIS — E1169 Type 2 diabetes mellitus with other specified complication: Secondary | ICD-10-CM | POA: Diagnosis not present

## 2018-06-06 DIAGNOSIS — M47812 Spondylosis without myelopathy or radiculopathy, cervical region: Secondary | ICD-10-CM | POA: Diagnosis not present

## 2018-06-18 ENCOUNTER — Ambulatory Visit (INDEPENDENT_AMBULATORY_CARE_PROVIDER_SITE_OTHER): Payer: PPO | Admitting: Bariatrics

## 2018-06-18 ENCOUNTER — Encounter (INDEPENDENT_AMBULATORY_CARE_PROVIDER_SITE_OTHER): Payer: Self-pay

## 2018-06-19 ENCOUNTER — Ambulatory Visit (INDEPENDENT_AMBULATORY_CARE_PROVIDER_SITE_OTHER): Payer: PPO | Admitting: Bariatrics

## 2018-06-19 ENCOUNTER — Encounter (INDEPENDENT_AMBULATORY_CARE_PROVIDER_SITE_OTHER): Payer: Self-pay

## 2018-06-20 ENCOUNTER — Encounter (INDEPENDENT_AMBULATORY_CARE_PROVIDER_SITE_OTHER): Payer: Self-pay

## 2018-06-23 ENCOUNTER — Encounter (INDEPENDENT_AMBULATORY_CARE_PROVIDER_SITE_OTHER): Payer: Self-pay | Admitting: Bariatrics

## 2018-07-15 DIAGNOSIS — M542 Cervicalgia: Secondary | ICD-10-CM | POA: Diagnosis not present

## 2018-09-26 DIAGNOSIS — H6982 Other specified disorders of Eustachian tube, left ear: Secondary | ICD-10-CM | POA: Diagnosis not present

## 2018-09-26 DIAGNOSIS — H903 Sensorineural hearing loss, bilateral: Secondary | ICD-10-CM | POA: Diagnosis not present

## 2018-09-26 DIAGNOSIS — H9313 Tinnitus, bilateral: Secondary | ICD-10-CM | POA: Diagnosis not present

## 2018-09-26 DIAGNOSIS — R42 Dizziness and giddiness: Secondary | ICD-10-CM | POA: Diagnosis not present

## 2018-09-26 DIAGNOSIS — D170 Benign lipomatous neoplasm of skin and subcutaneous tissue of head, face and neck: Secondary | ICD-10-CM | POA: Diagnosis not present

## 2018-09-26 DIAGNOSIS — M26623 Arthralgia of bilateral temporomandibular joint: Secondary | ICD-10-CM | POA: Diagnosis not present

## 2018-10-23 DIAGNOSIS — M47812 Spondylosis without myelopathy or radiculopathy, cervical region: Secondary | ICD-10-CM | POA: Diagnosis not present

## 2018-10-23 DIAGNOSIS — E1169 Type 2 diabetes mellitus with other specified complication: Secondary | ICD-10-CM | POA: Diagnosis not present

## 2018-10-23 DIAGNOSIS — E785 Hyperlipidemia, unspecified: Secondary | ICD-10-CM | POA: Diagnosis not present

## 2018-11-21 DIAGNOSIS — E1169 Type 2 diabetes mellitus with other specified complication: Secondary | ICD-10-CM | POA: Diagnosis not present

## 2018-11-21 DIAGNOSIS — M47812 Spondylosis without myelopathy or radiculopathy, cervical region: Secondary | ICD-10-CM | POA: Diagnosis not present

## 2018-11-21 DIAGNOSIS — E785 Hyperlipidemia, unspecified: Secondary | ICD-10-CM | POA: Diagnosis not present

## 2018-12-09 DIAGNOSIS — Z23 Encounter for immunization: Secondary | ICD-10-CM | POA: Diagnosis not present

## 2018-12-09 DIAGNOSIS — E559 Vitamin D deficiency, unspecified: Secondary | ICD-10-CM | POA: Diagnosis not present

## 2018-12-09 DIAGNOSIS — E785 Hyperlipidemia, unspecified: Secondary | ICD-10-CM | POA: Diagnosis not present

## 2018-12-09 DIAGNOSIS — E1169 Type 2 diabetes mellitus with other specified complication: Secondary | ICD-10-CM | POA: Diagnosis not present

## 2018-12-25 DIAGNOSIS — E785 Hyperlipidemia, unspecified: Secondary | ICD-10-CM | POA: Diagnosis not present

## 2018-12-25 DIAGNOSIS — M47812 Spondylosis without myelopathy or radiculopathy, cervical region: Secondary | ICD-10-CM | POA: Diagnosis not present

## 2018-12-25 DIAGNOSIS — E1169 Type 2 diabetes mellitus with other specified complication: Secondary | ICD-10-CM | POA: Diagnosis not present

## 2019-02-11 ENCOUNTER — Other Ambulatory Visit: Payer: Self-pay

## 2019-02-11 ENCOUNTER — Encounter (HOSPITAL_COMMUNITY): Payer: Self-pay

## 2019-02-11 ENCOUNTER — Emergency Department (HOSPITAL_COMMUNITY)
Admission: EM | Admit: 2019-02-11 | Discharge: 2019-02-11 | Discharge status: Against Medical Advice | Payer: PPO | Attending: Emergency Medicine | Admitting: Emergency Medicine

## 2019-02-11 ENCOUNTER — Emergency Department (HOSPITAL_COMMUNITY): Payer: PPO

## 2019-02-11 DIAGNOSIS — R079 Chest pain, unspecified: Secondary | ICD-10-CM | POA: Diagnosis not present

## 2019-02-11 DIAGNOSIS — I10 Essential (primary) hypertension: Secondary | ICD-10-CM | POA: Diagnosis not present

## 2019-02-11 DIAGNOSIS — Z20828 Contact with and (suspected) exposure to other viral communicable diseases: Secondary | ICD-10-CM | POA: Diagnosis not present

## 2019-02-11 DIAGNOSIS — R0789 Other chest pain: Secondary | ICD-10-CM | POA: Diagnosis not present

## 2019-02-11 DIAGNOSIS — E119 Type 2 diabetes mellitus without complications: Secondary | ICD-10-CM | POA: Diagnosis not present

## 2019-02-11 DIAGNOSIS — Z79899 Other long term (current) drug therapy: Secondary | ICD-10-CM | POA: Insufficient documentation

## 2019-02-11 DIAGNOSIS — E1169 Type 2 diabetes mellitus with other specified complication: Secondary | ICD-10-CM | POA: Diagnosis not present

## 2019-02-11 DIAGNOSIS — E785 Hyperlipidemia, unspecified: Secondary | ICD-10-CM | POA: Diagnosis not present

## 2019-02-11 DIAGNOSIS — Z7984 Long term (current) use of oral hypoglycemic drugs: Secondary | ICD-10-CM | POA: Diagnosis not present

## 2019-02-11 DIAGNOSIS — I200 Unstable angina: Secondary | ICD-10-CM | POA: Diagnosis not present

## 2019-02-11 HISTORY — DX: Essential (primary) hypertension: I10

## 2019-02-11 LAB — CBC
HCT: 47.5 % — ABNORMAL HIGH (ref 36.0–46.0)
Hemoglobin: 16 g/dL — ABNORMAL HIGH (ref 12.0–15.0)
MCH: 30.1 pg (ref 26.0–34.0)
MCHC: 33.7 g/dL (ref 30.0–36.0)
MCV: 89.5 fL (ref 80.0–100.0)
Platelets: 216 10*3/uL (ref 150–400)
RBC: 5.31 MIL/uL — ABNORMAL HIGH (ref 3.87–5.11)
RDW: 13.3 % (ref 11.5–15.5)
WBC: 8.6 10*3/uL (ref 4.0–10.5)
nRBC: 0 % (ref 0.0–0.2)

## 2019-02-11 LAB — TROPONIN I (HIGH SENSITIVITY)
Troponin I (High Sensitivity): <2 ng/L (ref <18)
Troponin I (High Sensitivity): <2 ng/L (ref <18)

## 2019-02-11 LAB — BASIC METABOLIC PANEL
Anion gap: 12 (ref 5–15)
BUN: 15 mg/dL (ref 8–23)
CO2: 22 mmol/L (ref 22–32)
Calcium: 9.4 mg/dL (ref 8.9–10.3)
Chloride: 102 mmol/L (ref 98–111)
Creatinine, Ser: 0.83 mg/dL (ref 0.44–1.00)
GFR calc Af Amer: >60 mL/min (ref >60)
GFR calc non Af Amer: >60 mL/min (ref >60)
Glucose, Bld: 127 mg/dL — ABNORMAL HIGH (ref 70–99)
Potassium: 4.3 mmol/L (ref 3.5–5.1)
Sodium: 136 mmol/L (ref 135–145)

## 2019-02-11 MED ORDER — SODIUM CHLORIDE 0.9% FLUSH: 3.0000 mL | Freq: Once | INTRAVENOUS | Status: DC

## 2019-02-11 NOTE — ED Triage Notes (Signed)
Patient complains of CP since 0630-0700 with beating in her ear. Patient reports that the pain has improved since the onset.  Denies radaition

## 2019-02-11 NOTE — ED Provider Notes (Signed)
[facility] EMERGENCY DEPARTMENT Provider Note   CSN: [id number] Arrival date & time: 02/11/19  1144     History   Chief Complaint No chief complaint on file.   HPI Lauren James is a 68 y.o. female.  Presents emerge department chief complaint of chest pain.  Patient states this morning she woke up having moderate to severe chest pain, describes it as sharp, stabbing pain, worse on left side.  Was relatively constant for couple hours and then resolve spontaneously.  Also noted sensation of her heart beating in both of her ears.  Currently does not have any chest pain or this year sensation.  She has not had any associated shortness of breath, cough, fever, sick contacts.  No associated leg pain or leg swelling.  Patient has past medical history diabetes, hypertension, hyperlipidemia, tinnitus, hearing loss, followed by audiology and ENT.     HPI  Past Medical History:  Diagnosis Date  . Atypical chest pain 04/12/2017  . Back pain   . Diabetes mellitus type 2 in obese ([place]) 04/12/2017  . Dysuria   . Frequency of urination   . GERD (gastroesophageal reflux disease)    WATCHES DIET  . Hematuria   . History of kidney problems   . History of kidney stones   . Hyperlipidemia 04/12/2017  . Hypertension   . Nocturia   . Prediabetes    DIET CONTROLLED  . Right ureteral stone   . Urgency of urination   . Vitamin D deficiency   . Wears glasses     Patient Active Problem List   Diagnosis Date Noted  . Insulin resistance 04/25/2018  . Class 1 obesity with serious comorbidity and body mass index (BMI) of 30.0 to 30.9 in adult 02/27/2018  . Atypical chest pain 04/12/2017  . Diabetes mellitus type 2 in obese ([place]) 04/12/2017  . Hyperlipidemia 04/12/2017    Past Surgical History:  Procedure Laterality Date  . CHOLECYSTECTOMY  2000  (APPROX)  . CYSTOSCOPY W/ URETERAL STENT PLACEMENT Right 12/13/2012   Procedure: CYSTOSCOPY/RIGHT URETERAL STENT EXCHANGE/RIGHT  RETROGRADE PYELOGRAM;  Surgeon: [name], MD;  [facility];  Service: Urology;  Laterality: Right;  . CYSTOSCOPY WITH URETEROSCOPY Right 11/27/2012   Procedure: CYSTOSCOPY WITH RIGHT  URETEROSCOPY AND STENT PLACEMENT ;  Surgeon: [name], MD;  Location: [facility];  Service: Urology;  Laterality: Right;  . [place]  (APPROX)  . HOLMIUM LASER APPLICATION Right [id number]   Procedure: HOLMIUM LASER APPLICATION;  Surgeon: [name], MD;  Location: [facility];  Service: Urology;  Laterality: Right;  . HOLMIUM LASER APPLICATION Right [id number]   Procedure: HOLMIUM LASER APPLICATION;  Surgeon: [name], MD;  Location: [facility];  Service: Urology;  Laterality: Right;  . URETEROSCOPY Right 12/13/2012   Procedure: URETEROSCOPY;  Surgeon: [name], MD;  Location: [facility];  Service: Urology;  Laterality: Right;     OB History    Gravida  3   Para  3   Term      Preterm      AB      Living        SAB      TAB      Ectopic      Multiple      Live Births               Home Medications    Prior to  Admission medications   Medication Sig Start Date End Date Taking? Authorizing Provider  Cholecalciferol (VITAMIN D-3) 5000 UNIT/ML LIQD Place under the tongue.    [provider]  glucose blood (IGLUCOSE TEST STRIPS) test strip Twice daily (use Bayer brand or whichever ins covers) 03/07/18   Jearld Lesch A, DO  JARDIANCE 25 MG TABS tablet Take 25 mg by mouth daily. 04/02/17   [provider]  metFORMIN (GLUCOPHAGE) 500 MG tablet Take 500 mg by mouth daily with breakfast.    [provider]  Multiple Vitamins-Minerals (CENTRUM SILVER 50+WOMEN PO) Take by mouth.    [provider]  Omega-3 Fatty Acids (OMEGA-3 FISH OIL PO) Take 1 capsule by mouth daily. Omega  Red Fish Oil    [provider]  rosuvastatin (CRESTOR) 5 MG tablet  Take 5 mg by mouth every evening. 03/21/17   [provider]  tiZANidine (ZANAFLEX) 2 MG tablet Take 2 mg by mouth daily as needed. 04/02/17   [provider]    Family History Family History  Problem Relation Age of Onset  . Diabetes Mother   . Kidney disease Mother   . Heart disease Mother   . High blood pressure Mother   . Stroke Mother   . Obesity Mother   . Dementia Father   . Stroke Father   . Heart attack Sister   . Diabetes Sister   . Diabetes Maternal Grandmother   . Heart attack Maternal Grandfather   . Alcoholism Paternal Grandfather   . Diabetes Sister   . Osteoporosis Sister     Social History Social History   Tobacco Use  . Smoking status: Never Smoker  . Smokeless tobacco: Never Used  Substance Use Topics  . Alcohol use: No  . Drug use: No     Allergies   Adhesive [tape]   Review of Systems Review of Systems  Constitutional: Negative for chills and fever.  HENT: Negative for ear pain and sore throat.   Eyes: Negative for pain and visual disturbance.  Respiratory: Negative for cough and shortness of breath.   Cardiovascular: Positive for chest pain. Negative for palpitations.  Gastrointestinal: Negative for abdominal pain and vomiting.  Genitourinary: Negative for dysuria and hematuria.  Musculoskeletal: Negative for arthralgias and back pain.  Skin: Negative for color change and rash.  Neurological: Negative for seizures and syncope.  All other systems reviewed and are negative.    Physical Exam Updated Vital Signs BP 139/73   Pulse 69   Temp 98.7 F (37.1 C) (Oral)   Resp 20   SpO2 99%   Physical Exam Vitals signs and nursing note reviewed.  Constitutional:      General: She is not in acute distress.    Appearance: She is well-developed.  HENT:     Head: Normocephalic and atraumatic.  Eyes:     Conjunctiva/sclera: Conjunctivae normal.  Neck:     Musculoskeletal: Neck supple.  Cardiovascular:     Rate and  Rhythm: Normal rate and regular rhythm.     Heart sounds: No murmur.  Pulmonary:     Effort: Pulmonary effort is normal. No respiratory distress.     Breath sounds: Normal breath sounds.  Abdominal:     Palpations: Abdomen is soft.     Tenderness: There is no abdominal tenderness.  Musculoskeletal:        General: No swelling or tenderness.  Skin:    General: Skin is warm and dry.     Capillary Refill:  Capillary refill takes less than 2 seconds.  Neurological:     General: No focal deficit present.     Mental Status: She is alert and oriented to person, place, and time.  Psychiatric:        Mood and Affect: Mood normal.        Behavior: Behavior normal.      ED Treatments / Results  Labs (all labs ordered are listed, but only abnormal results are displayed) Labs Reviewed  BASIC METABOLIC PANEL - Abnormal; Notable for the following components:      Result Value   Glucose, Bld 127 (*)    All other components within normal limits  CBC - Abnormal; Notable for the following components:   RBC 5.31 (*)    Hemoglobin 16.0 (*)    HCT 47.5 (*)    All other components within normal limits  TROPONIN I (HIGH SENSITIVITY)  TROPONIN I (HIGH SENSITIVITY)    EKG EKG Interpretation  Date/Time:  Tuesday February 11 2019 11:56:52 EST Ventricular Rate:  75 PR Interval:  156 QRS Duration: 68 QT Interval:  402 QTC Calculation: 448 R Axis:   39 Text Interpretation: Normal sinus rhythm Normal ECG Confirmed by Madalyn Rob [phone]) on 02/11/2019 3:29:08 PM   Radiology Dg Chest 2 View  Result Date: 02/11/2019 CLINICAL DATA:  Chest pain EXAM: CHEST - 2 VIEW COMPARISON:  February 20, 2017 FINDINGS: The heart size and mediastinal contours are within normal limits. Biapical scarring or pleural thickening is again noted. No large airspace consolidation or pleural effusion. The visualized skeletal structures are unremarkable. IMPRESSION: No active cardiopulmonary disease. Electronically  Signed   By: Prudencio Pair M.D.   On: 02/11/2019 12:33    Procedures Procedures (including critical care time)  Medications Ordered in ED Medications - No data to display   Initial Impression / Assessment and Plan / ED Course  I have reviewed the triage vital signs and the nursing notes.  Pertinent labs & imaging results that were available during my care of the patient were reviewed by me and considered in my medical decision making (see chart for details).       68 year old lady presents to ER with chest pain episode earlier today.  Pain was constant, not associated with exertion.  Resolved spontaneously.  Here patient was symptom-free.  EKG without ischemic changes, troponin x2 undetectable.  Doubt ACS.  Chest x-ray was negative.  She has no hypoxia, no tachypnea, no shortness of breath, doubt pulmonary embolism.  Chest x-ray negative for acute pulmonary pathology.  Vital signs remained stable while in ER, given above work-up, no ongoing symptoms, we patient is appropriate for discharge and outpatient management this time.  While awaiting her second troponin, patient decided she did not want a wait any longer wanted to go home.  Patient left without receiving her discharge paperwork.  Repeat troponin after discharge was negative.   Final Clinical Impressions(s) / ED Diagnoses   Final diagnoses:  None    ED Discharge Orders    None       Lucrezia Starch, MD 02/11/19 2316

## 2019-03-24 ENCOUNTER — Encounter (INDEPENDENT_AMBULATORY_CARE_PROVIDER_SITE_OTHER): Payer: Self-pay | Admitting: Bariatrics

## 2019-05-27 DIAGNOSIS — M47812 Spondylosis without myelopathy or radiculopathy, cervical region: Secondary | ICD-10-CM | POA: Diagnosis not present

## 2019-05-27 DIAGNOSIS — E1169 Type 2 diabetes mellitus with other specified complication: Secondary | ICD-10-CM | POA: Diagnosis not present

## 2019-05-27 DIAGNOSIS — Z7984 Long term (current) use of oral hypoglycemic drugs: Secondary | ICD-10-CM | POA: Diagnosis not present

## 2019-05-27 DIAGNOSIS — E785 Hyperlipidemia, unspecified: Secondary | ICD-10-CM | POA: Diagnosis not present

## 2019-06-11 DIAGNOSIS — Z0000 Encounter for general adult medical examination without abnormal findings: Secondary | ICD-10-CM | POA: Diagnosis not present

## 2019-06-11 DIAGNOSIS — R5382 Chronic fatigue, unspecified: Secondary | ICD-10-CM | POA: Diagnosis not present

## 2019-06-11 DIAGNOSIS — E785 Hyperlipidemia, unspecified: Secondary | ICD-10-CM | POA: Diagnosis not present

## 2019-06-11 DIAGNOSIS — E1169 Type 2 diabetes mellitus with other specified complication: Secondary | ICD-10-CM | POA: Diagnosis not present

## 2019-06-11 DIAGNOSIS — E559 Vitamin D deficiency, unspecified: Secondary | ICD-10-CM | POA: Diagnosis not present

## 2019-06-11 DIAGNOSIS — H9193 Unspecified hearing loss, bilateral: Secondary | ICD-10-CM | POA: Diagnosis not present

## 2019-09-15 DIAGNOSIS — E785 Hyperlipidemia, unspecified: Secondary | ICD-10-CM | POA: Diagnosis not present

## 2019-09-15 DIAGNOSIS — I10 Essential (primary) hypertension: Secondary | ICD-10-CM | POA: Diagnosis not present

## 2019-09-15 DIAGNOSIS — E1169 Type 2 diabetes mellitus with other specified complication: Secondary | ICD-10-CM | POA: Diagnosis not present

## 2019-09-15 DIAGNOSIS — R61 Generalized hyperhidrosis: Secondary | ICD-10-CM | POA: Diagnosis not present

## 2019-10-15 DIAGNOSIS — I10 Essential (primary) hypertension: Secondary | ICD-10-CM | POA: Diagnosis not present

## 2019-10-22 ENCOUNTER — Encounter: Payer: PPO | Attending: Family Medicine | Admitting: Skilled Nursing Facility1

## 2019-10-22 ENCOUNTER — Encounter: Payer: Self-pay | Admitting: Skilled Nursing Facility1

## 2019-10-22 ENCOUNTER — Other Ambulatory Visit: Payer: Self-pay

## 2019-10-22 DIAGNOSIS — E1169 Type 2 diabetes mellitus with other specified complication: Secondary | ICD-10-CM | POA: Diagnosis not present

## 2019-10-22 DIAGNOSIS — E669 Obesity, unspecified: Secondary | ICD-10-CM | POA: Insufficient documentation

## 2019-10-22 NOTE — Progress Notes (Signed)
Diabetes Self-Management Education  Visit Type: First/Initial  Appt. Start Time: 2:00 Appt. End Time: 3:10  10/22/2019  Lauren James, identified by name and date of birth, is a 69 y.o. female with a diagnosis of Diabetes: Type 2.   ASSESSMENT  Husband is present with patient Reports husband does the grocery shopping Pt reports wanting to get her diabetes under control, and wanting to get off of Actos. Pt reports stopping cholesterol medicine due to night sweats and hot flashes. Pt very concerned with weight. Pt expresses fear of having diabetes. Pt reports low energy, and generally not feeling well. Has been feeling this way for years.  Pt reports knowing that she needs to plan meals Pt reports doing a group art class, enjoys it as a stress reliever. Pt reports currently checking blood sugar, 3 times a day. Reports mispackaged strips keeping her from checking for a time, but is back to 1-2 times daily. Pt reports neuropathy as tingling and burning in her extremities. Advised pt. To consult PCP and check feet daily. Pt reports getting pre-occupied and forgetting to eat, then being extremely hungry later.  There were no vitals taken for this visit. There is no height or weight on file to calculate BMI.  Pt declined having weight taken.   Diabetes Self-Management Education - 10/22/19 1425      Visit Information   Visit Type First/Initial      Initial Visit   Diabetes Type Type 2    Are you currently following a meal plan? No    Are you taking your medications as prescribed? Yes      Health Coping   How would you rate your overall health? Good      Psychosocial Assessment   Patient Belief/Attitude about Diabetes Defeat/Burnout   Pt reprots fear of her condition. Reports her mother died of complications from diabetes   Self-care barriers Hard of hearing    Self-management support Family    Other persons present Spouse/SO    Patient Concerns Nutrition/Meal planning      Special Needs None    Preferred Learning Style No preference indicated    Learning Readiness Ready    How often do you need to have someone help you when you read instructions, pamphlets, or other written materials from your doctor or pharmacy? 1 - Never    What is the last grade level you completed in school? high school graduate      Pre-Education Assessment   Patient understands the diabetes disease and treatment process. Needs Review    Patient understands incorporating nutritional management into lifestyle. Needs Instruction    Patient undertands incorporating physical activity into lifestyle. Needs Instruction    Patient understands using medications safely. Needs Instruction    Patient understands monitoring blood glucose, interpreting and using results Needs Review    Patient understands prevention, detection, and treatment of acute complications. Needs Review    Patient understands prevention, detection, and treatment of chronic complications. Needs Review    Patient understands how to develop strategies to address psychosocial issues. Needs Instruction    Patient understands how to develop strategies to promote health/change behavior. Needs Instruction      Complications   Last HgB A1C per patient/outside source 7.1 %    How often do you check your blood sugar? 1-2 times/day    Fasting Blood glucose range (mg/dL) 130-179    Postprandial Blood glucose range (mg/dL) 130-179    Number of hypoglycemic episodes per month 0  Number of hyperglycemic episodes per week 2    Can you tell when your blood sugar is high? No   Pt reports "not feeling well" but it does not coencide with high or low blood sugar   Have you had a dilated eye exam in the past 12 months? Yes    Have you had a dental exam in the past 12 months? No    Are you checking your feet? Yes    How many days per week are you checking your feet? 1   Pt reported nueropathy. Advised to check feet every day     Dietary  Intake   Breakfast [employer] chicken biscuit, black coffee, water, sugar free    Snack (afternoon) pineapple, blueberries, raspberries, cantaloupe, mixed nuts, water    Dinner Fried Chicken, stewed potatoes, fried squash, deviled eggs    Snack (evening) Bowl of cherries      Exercise   Exercise Type ADL's      Patient Education   Previous Diabetes Education No    Disease state  Definition of diabetes, type 1 and 2, and the diagnosis of diabetes;Factors that contribute to the development of diabetes    Nutrition management  Role of diet in the treatment of diabetes and the relationship between the three main macronutrients and blood glucose level;Meal timing in regards to the patients' current diabetes medication.;Meal options for control of blood glucose level and chronic complications.;Reviewed blood glucose goals for pre and post meals and how to evaluate the patients' food intake on their blood glucose level.    Medications Reviewed patients medication for diabetes, action, purpose, timing of dose and side effects.    Monitoring Taught/discussed recording of test results and interpretation of SMBG.    Chronic complications Relationship between chronic complications and blood glucose control;Assessed and discussed foot care and prevention of foot problems;Retinopathy and reason for yearly dilated eye exams    Psychosocial adjustment Other (comment)      Individualized Goals (developed by patient)   Nutrition General guidelines for healthy choices and portions discussed    Physical Activity Not Applicable    Medications take my medication as prescribed    Monitoring  test my blood glucose as discussed;test blood glucose pre and post meals as discussed      Post-Education Assessment   Patient understands the diabetes disease and treatment process. Needs Review    Patient understands incorporating nutritional management into lifestyle. Needs Review    Patient undertands incorporating  physical activity into lifestyle. Needs Review    Patient understands using medications safely. Needs Review    Patient understands monitoring blood glucose, interpreting and using results Needs Review    Patient understands prevention, detection, and treatment of acute complications. Needs Review    Patient understands prevention, detection, and treatment of chronic complications. Needs Review    Patient understands how to develop strategies to address psychosocial issues. Needs Review    Patient understands how to develop strategies to promote health/change behavior. Needs Review      Outcomes   Expected Outcomes Demonstrated interest in learning. Expect positive outcomes    Future DMSE 4-6 wks    Program Status Not Completed           Individualized Plan for Diabetes Self-Management Training:   Learning Objective:  Patient will have a greater understanding of diabetes self-management. Patient education plan is to attend individual and/or group sessions per assessed needs and concerns.   Plan:   There are no Patient  Instructions on file for this visit.  Expected Outcomes:  Demonstrated interest in learning. Expect positive outcomes  Education material provided: ADA - How to Thrive: A Guide for Your Journey with Diabetes and My Plate  If problems or questions, patient to contact team via:  Phone and Email  Future DSME appointment: 4-6 wks

## 2019-11-25 ENCOUNTER — Encounter: Payer: PPO | Attending: Family Medicine | Admitting: Skilled Nursing Facility1

## 2019-11-25 ENCOUNTER — Other Ambulatory Visit: Payer: Self-pay

## 2019-11-25 DIAGNOSIS — E1169 Type 2 diabetes mellitus with other specified complication: Secondary | ICD-10-CM | POA: Insufficient documentation

## 2019-11-25 DIAGNOSIS — E669 Obesity, unspecified: Secondary | ICD-10-CM | POA: Diagnosis not present

## 2019-11-25 NOTE — Progress Notes (Signed)
° °  Diabetes Self-Management Education  Visit Type:    Appt. Start Time: 2:00 Appt. End Time: 3:10  11/25/2019  Ms. Lauren James, identified by name and date of birth, is a 69 y.o. female with a diagnosis of Diabetes:  .   ASSESSMENT  Husband is present with patient Reports husband does the grocery shopping Pt reports wanting to get her diabetes under control, and wanting to get off of Actos. Pt reports stopping cholesterol medicine due to night sweats and hot flashes. Pt very concerned with weight. Pt expresses fear of having diabetes. Pt reports low energy, and generally not feeling well. Has been feeling this way for years.  Pt reports knowing that she needs to plan meals Pt reports doing a group art class, enjoys it as a stress reliever. Pt reports currently checking blood sugar, 3 times a day. Reports mispackaged strips keeping her from checking for a time, but is back to 1-2 times daily. Pt reports neuropathy as tingling and burning in her extremities. Advised pt. To consult PCP and check feet daily. Pt reports getting pre-occupied and forgetting to eat, then being extremely hungry later.   Pt states her average number shave been 144 with highest 168 and lowest 128. Pt states she checks 2 hours post prandial staying around 140's.  Pt states she has been aware of what she eats and not over doing it through consistency. Pt states she cannot remember anything Pt states she no longer buys crackers. Pt states she has limited her sugar intake. Pt states she feels she does not drink enough throughout the day in a whole.  Pt states she notices sometimes she loses her balance.  24 hr recall:  Breakfast: cold cereal + fat free [employer] milk fruit Lunch: salad: ham and egg and veggies  dinner: fish or chicken + broccoli + carrots + potatoes   Beverages: sugar free nature twist, soda, water, black coffee  Goals: Great job on getting you blood sugars down from last visit! Ensure  you are limiting yourself to one snack in between meals such as 1 cheese stick or 1 serving of fruit Ensure you are limiting sweets, red meat, and processed meat throughout the day and week Eat until satisfaction not a physical feeling of fullness Aim to be physically active for 30 minutes most days of the week  Create balanced meals when you do eat: complex carbohydrates, lean proteins, and non starchy vegetables Limit eating out to once a week Ensure you are choosing whole wheat bread, whole wheat pasta, brown rice

## 2019-12-18 DIAGNOSIS — Z23 Encounter for immunization: Secondary | ICD-10-CM | POA: Diagnosis not present

## 2019-12-18 DIAGNOSIS — I10 Essential (primary) hypertension: Secondary | ICD-10-CM | POA: Diagnosis not present

## 2019-12-18 DIAGNOSIS — E785 Hyperlipidemia, unspecified: Secondary | ICD-10-CM | POA: Diagnosis not present

## 2019-12-18 DIAGNOSIS — G720 Drug-induced myopathy: Secondary | ICD-10-CM | POA: Diagnosis not present

## 2019-12-18 DIAGNOSIS — E1169 Type 2 diabetes mellitus with other specified complication: Secondary | ICD-10-CM | POA: Diagnosis not present

## 2019-12-18 DIAGNOSIS — E559 Vitamin D deficiency, unspecified: Secondary | ICD-10-CM | POA: Diagnosis not present

## 2020-06-16 DIAGNOSIS — E559 Vitamin D deficiency, unspecified: Secondary | ICD-10-CM | POA: Diagnosis not present

## 2020-06-16 DIAGNOSIS — E1169 Type 2 diabetes mellitus with other specified complication: Secondary | ICD-10-CM | POA: Diagnosis not present

## 2020-06-16 DIAGNOSIS — E785 Hyperlipidemia, unspecified: Secondary | ICD-10-CM | POA: Diagnosis not present

## 2020-06-16 DIAGNOSIS — Z86010 Personal history of colonic polyps: Secondary | ICD-10-CM | POA: Diagnosis not present

## 2020-06-16 DIAGNOSIS — I10 Essential (primary) hypertension: Secondary | ICD-10-CM | POA: Diagnosis not present

## 2020-06-16 DIAGNOSIS — G720 Drug-induced myopathy: Secondary | ICD-10-CM | POA: Diagnosis not present

## 2020-06-16 DIAGNOSIS — Z0000 Encounter for general adult medical examination without abnormal findings: Secondary | ICD-10-CM | POA: Diagnosis not present

## 2020-07-26 ENCOUNTER — Other Ambulatory Visit: Payer: Self-pay | Admitting: Family Medicine

## 2020-07-26 DIAGNOSIS — Z1231 Encounter for screening mammogram for malignant neoplasm of breast: Secondary | ICD-10-CM

## 2020-07-28 ENCOUNTER — Other Ambulatory Visit: Payer: Self-pay

## 2020-07-28 ENCOUNTER — Ambulatory Visit
Admission: RE | Admit: 2020-07-28 | Discharge: 2020-07-28 | Disposition: A | Discharge status: Home / Self Care | Payer: PPO | Source: Ambulatory Visit | Attending: Family Medicine | Admitting: Family Medicine

## 2020-07-28 DIAGNOSIS — Z1231 Encounter for screening mammogram for malignant neoplasm of breast: Secondary | ICD-10-CM

## 2020-08-20 DIAGNOSIS — M542 Cervicalgia: Secondary | ICD-10-CM | POA: Diagnosis not present

## 2020-08-20 DIAGNOSIS — M546 Pain in thoracic spine: Secondary | ICD-10-CM | POA: Diagnosis not present

## 2020-08-30 DIAGNOSIS — M542 Cervicalgia: Secondary | ICD-10-CM | POA: Diagnosis not present

## 2020-09-03 DIAGNOSIS — G959 Disease of spinal cord, unspecified: Secondary | ICD-10-CM | POA: Diagnosis not present

## 2020-09-07 ENCOUNTER — Other Ambulatory Visit: Payer: Self-pay | Admitting: Orthopedic Surgery

## 2020-10-08 NOTE — Pre-Procedure Instructions (Signed)
Surgical Instructions    Your procedure is scheduled on Thursday, July 21st.  Report to [facility] Main Entrance "A" at 5:30 A.M., then check in with the Admitting office.  Call this number if you have problems the morning of surgery:  [phone]   If you have any questions prior to your surgery date call [phone]: Open Monday-Friday 8am-4pm    Remember:  Do not eat after midnight the night before your surgery  You may drink clear liquids until 4:30 a.m. the morning of your surgery.   Clear liquids allowed are: Water, Non-Citrus Juices (without pulp), Carbonated Beverages, Clear Tea, Black Coffee Only, and Gatorade.   Enhanced Recovery after Surgery for Orthopedics Enhanced Recovery after Surgery is a protocol used to improve the stress on your body and your recovery after surgery.  Patient Instructions  The day of surgery (if you have diabetes):  Drink ONE small bottle of Gatorade G2 by 4:30 am the morning of surgery This bottle was given to you during your hospital  pre-op appointment visit.  Nothing else to drink after completing the  Small Gatorade G2.         If you have questions, please contact your surgeon's office.     Take these medicines the morning of surgery with A SIP OF WATER  icosapent Ethyl (VASCEPA)  rosuvastatin (CRESTOR)-if weekly dose is due on day of surgery.  As of today, STOP taking any Aspirin (unless otherwise instructed by your surgeon) Aleve, Naproxen, Ibuprofen, Motrin, Advil, Goody's, BC's, all herbal medications, fish oil, and all vitamins.  WHAT DO I DO ABOUT MY DIABETES MEDICATION?   Do not take metFORMIN (GLUCOPHAGE-XR) the morning of surgery.  HOW TO MANAGE YOUR DIABETES BEFORE AND AFTER SURGERY  Why is it important to control my blood sugar before and after surgery? Improving blood sugar levels before and after surgery helps healing and can limit problems. A way of improving blood sugar control is eating a healthy diet by:   Eating less sugar and carbohydrates  Increasing activity/exercise  Talking with your doctor about reaching your blood sugar goals High blood sugars (greater than 180 mg/dL) can raise your risk of infections and slow your recovery, so you will need to focus on controlling your diabetes during the weeks before surgery. Make sure that the doctor who takes care of your diabetes knows about your planned surgery including the date and location.  How do I manage my blood sugar before surgery? Check your blood sugar at least 4 times a day, starting 2 days before surgery, to make sure that the level is not too high or low.  Check your blood sugar the morning of your surgery when you wake up and every 2 hours until you get to the Short Stay unit.  If your blood sugar is less than 70 mg/dL, you will need to treat for low blood sugar: Do not take insulin. Treat a low blood sugar (less than 70 mg/dL) with  cup of clear juice (cranberry or apple), 4 glucose tablets, OR glucose gel. Recheck blood sugar in 15 minutes after treatment (to make sure it is greater than 70 mg/dL). If your blood sugar is not greater than 70 mg/dL on recheck, call [phone] for further instructions. Report your blood sugar to the short stay nurse when you get to Short Stay.  If you are admitted to the hospital after surgery: Your blood sugar will be checked by the staff and you will probably be given insulin after surgery (  instead of oral diabetes medicines) to make sure you have good blood sugar levels. The goal for blood sugar control after surgery is 80-180 mg/dL.                     Do NOT Smoke (Tobacco/Vaping) or drink Alcohol 24 hours prior to your procedure.  If you use a CPAP at night, you may bring all equipment for your overnight stay.   Contacts, glasses, piercing's, hearing aid's, dentures or partials may not be worn into surgery, please bring cases for these belongings.    For patients admitted to the  hospital, discharge time will be determined by your treatment team.   Patients discharged the day of surgery will not be allowed to drive home, and someone needs to stay with them for 24 hours.  ONLY 1 SUPPORT PERSON MAY BE PRESENT WHILE YOU ARE IN SURGERY. IF YOU ARE TO BE ADMITTED ONCE YOU ARE IN YOUR ROOM YOU WILL BE ALLOWED TWO (2) VISITORS.  Minor children may have two parents present. Special consideration for safety and communication needs will be reviewed on a case by case basis.   Special instructions:   [facility]- Preparing For Surgery  Before surgery, you can play an important role. Because skin is not sterile, your skin needs to be as free of germs as possible. You can reduce the number of germs on your skin by washing with CHG (chlorahexidine gluconate) Soap before surgery.  CHG is an antiseptic cleaner which kills germs and bonds with the skin to continue killing germs even after washing.    Oral Hygiene is also important to reduce your risk of infection.  Remember - BRUSH YOUR TEETH THE MORNING OF SURGERY WITH YOUR REGULAR TOOTHPASTE  Please do not use if you have an allergy to CHG or antibacterial soaps. If your skin becomes reddened/irritated stop using the CHG.  Do not shave (including legs and underarms) for at least 48 hours prior to first CHG shower. It is OK to shave your face.  Please follow these instructions carefully.   Shower the NIGHT BEFORE SURGERY and the MORNING OF SURGERY  If you chose to wash your hair, wash your hair first as usual with your normal shampoo.  After you shampoo, rinse your hair and body thoroughly to remove the shampoo.  Use CHG Soap as you would any other liquid soap. You can apply CHG directly to the skin and wash gently with a scrungie or a clean washcloth.   Apply the CHG Soap to your body ONLY FROM THE NECK DOWN.  Do not use on open wounds or open sores. Avoid contact with your eyes, ears, mouth and genitals (private parts). Wash Face  and genitals (private parts)  with your normal soap.   Wash thoroughly, paying special attention to the area where your surgery will be performed.  Thoroughly rinse your body with warm water from the neck down.  DO NOT shower/wash with your normal soap after using and rinsing off the CHG Soap.  Pat yourself dry with a CLEAN TOWEL.  Wear CLEAN PAJAMAS to bed the night before surgery  Place CLEAN SHEETS on your bed the night before your surgery  DO NOT SLEEP WITH PETS.   Day of Surgery: Shower with CHG soap. Do not wear jewelry, make up, nail polish, gel polish, artificial nails, or any other type of covering on natural nails including finger and toenails. If patients have artificial nails, gel coating, etc. that need  to be removed by a nail salon please have this removed prior to surgery. Surgery may need to be canceled/delayed if the surgeon/ anesthesia feels like the patient is unable to be adequately monitored. Do not wear lotions, powders, perfumes, or deodorant. Do not shave 48 hours prior to surgery.  Do not bring valuables to the hospital. [facility] is not responsible for any belongings or valuables. Wear Clean/Comfortable clothing the morning of surgery Remember to brush your teeth WITH YOUR REGULAR TOOTHPASTE.   Please read over the following fact sheets that you were given.

## 2020-10-11 ENCOUNTER — Encounter (HOSPITAL_COMMUNITY)
Admission: RE | Admit: 2020-10-11 | Discharge: 2020-10-11 | Disposition: A | Discharge status: Home / Self Care | Payer: PPO | Source: Ambulatory Visit | Attending: Orthopedic Surgery | Admitting: Orthopedic Surgery

## 2020-10-11 ENCOUNTER — Encounter (HOSPITAL_COMMUNITY): Payer: Self-pay

## 2020-10-11 ENCOUNTER — Other Ambulatory Visit: Payer: Self-pay

## 2020-10-11 DIAGNOSIS — Z20822 Contact with and (suspected) exposure to covid-19: Secondary | ICD-10-CM | POA: Insufficient documentation

## 2020-10-11 DIAGNOSIS — Z01818 Encounter for other preprocedural examination: Secondary | ICD-10-CM | POA: Insufficient documentation

## 2020-10-11 DIAGNOSIS — G959 Disease of spinal cord, unspecified: Secondary | ICD-10-CM | POA: Diagnosis not present

## 2020-10-11 HISTORY — DX: Nausea with vomiting, unspecified: R11.2

## 2020-10-11 HISTORY — DX: Unspecified osteoarthritis, unspecified site: M19.90

## 2020-10-11 HISTORY — DX: Other specified postprocedural states: Z98.890

## 2020-10-11 LAB — URINALYSIS, ROUTINE W REFLEX MICROSCOPIC
Bilirubin Urine: NEGATIVE
Glucose, UA: NEGATIVE mg/dL
Ketones, ur: NEGATIVE mg/dL
Nitrite: NEGATIVE
Protein, ur: NEGATIVE mg/dL
Specific Gravity, Urine: 1.013 (ref 1.005–1.030)
pH: 5 (ref 5.0–8.0)

## 2020-10-11 LAB — COMPREHENSIVE METABOLIC PANEL
ALT: 26 U/L (ref 0–44)
AST: 22 U/L (ref 15–41)
Albumin: 4 g/dL (ref 3.5–5.0)
Alkaline Phosphatase: 47 U/L (ref 38–126)
Anion gap: 10 (ref 5–15)
BUN: 12 mg/dL (ref 8–23)
CO2: 26 mmol/L (ref 22–32)
Calcium: 9.5 mg/dL (ref 8.9–10.3)
Chloride: 103 mmol/L (ref 98–111)
Creatinine, Ser: 0.75 mg/dL (ref 0.44–1.00)
GFR, Estimated: >60 mL/min (ref >60)
Glucose, Bld: 134 mg/dL — ABNORMAL HIGH (ref 70–99)
Potassium: 4.1 mmol/L (ref 3.5–5.1)
Sodium: 139 mmol/L (ref 135–145)
Total Bilirubin: 0.4 mg/dL (ref 0.3–1.2)
Total Protein: 7.2 g/dL (ref 6.5–8.1)

## 2020-10-11 LAB — TYPE AND SCREEN
ABO/RH(D): O POS
Antibody Screen: NEGATIVE

## 2020-10-11 LAB — CBC WITH DIFFERENTIAL/PLATELET
Abs Immature Granulocytes: 0.03 10*3/uL (ref 0.00–0.07)
Basophils Absolute: 0.1 10*3/uL (ref 0.0–0.1)
Basophils Relative: 1 %
Eosinophils Absolute: 0.2 10*3/uL (ref 0.0–0.5)
Eosinophils Relative: 2 %
HCT: 45.1 % (ref 36.0–46.0)
Hemoglobin: 15.5 g/dL — ABNORMAL HIGH (ref 12.0–15.0)
Immature Granulocytes: 0 %
Lymphocytes Relative: 35 %
Lymphs Abs: 3.1 10*3/uL (ref 0.7–4.0)
MCH: 30.8 pg (ref 26.0–34.0)
MCHC: 34.4 g/dL (ref 30.0–36.0)
MCV: 89.5 fL (ref 80.0–100.0)
Monocytes Absolute: 0.7 10*3/uL (ref 0.1–1.0)
Monocytes Relative: 8 %
Neutro Abs: 5 10*3/uL (ref 1.7–7.7)
Neutrophils Relative %: 54 %
Platelets: 226 10*3/uL (ref 150–400)
RBC: 5.04 MIL/uL (ref 3.87–5.11)
RDW: 12.7 % (ref 11.5–15.5)
WBC: 8.9 10*3/uL (ref 4.0–10.5)
nRBC: 0 % (ref 0.0–0.2)

## 2020-10-11 LAB — HEMOGLOBIN A1C
Hgb A1c MFr Bld: 6.5 % — ABNORMAL HIGH (ref 4.8–5.6)
Mean Plasma Glucose: 139.85 mg/dL

## 2020-10-11 LAB — APTT: aPTT: 27 seconds (ref 24–36)

## 2020-10-11 LAB — GLUCOSE, CAPILLARY: Glucose-Capillary: 129 mg/dL — ABNORMAL HIGH (ref 70–99)

## 2020-10-11 LAB — PROTIME-INR
INR: 1 (ref 0.8–1.2)
Prothrombin Time: 13 seconds (ref 11.4–15.2)

## 2020-10-11 LAB — SURGICAL PCR SCREEN
MRSA, PCR: NEGATIVE
Staphylococcus aureus: NEGATIVE

## 2020-10-11 LAB — SARS CORONAVIRUS 2 (TAT 6-24 HRS): SARS Coronavirus 2: NEGATIVE

## 2020-10-11 NOTE — Progress Notes (Signed)
PCP - Harlan Stains Cardiologist - denies  PPM/ICD - denies   Chest x-ray - n/a EKG - 10/11/20 Stress Test - 04/12/17 ECHO - denies Cardiac Cath - denies  Sleep Study - over 10 years ago, unknown location and doctor CPAP - no (was not told she had sleep apnea)  Type 2 diabetic, checks blood sugar 2-4 times a week. Fasting CBG is 140.  Patient instructed to hold all Aspirin, NSAID's, herbal medications, fish oil and vitamins 7 days prior to surgery.    ERAS Protcol -yes PRE-SURGERY Ensure or G2- gatorade given  COVID TEST- 10/11/20 in pat   Anesthesia review: yes, cardiac history  Patient denies shortness of breath, fever, cough and chest pain at PAT appointment   All instructions explained to the patient, with a verbal understanding of the material. Patient agrees to go over the instructions while at home for a better understanding. Patient also instructed to self quarantine after being tested for COVID-19. The opportunity to ask questions was provided.

## 2020-10-11 NOTE — Progress Notes (Signed)
Pt's UA results showed leukocytes in urine. Staff message sent to Dr. Lynann Bologna with results attached to message.

## 2020-10-12 NOTE — Anesthesia Preprocedure Evaluation (Addendum)
Anesthesia Evaluation  Patient identified by MRN, date of birth, ID band Patient awake    Reviewed: Allergy & Precautions, NPO status , Patient's Chart, lab work & pertinent test results  History of Anesthesia Complications (+) PONV and history of anesthetic complications  Airway Mallampati: III  TM Distance: >3 FB Neck ROM: Full    Dental no notable dental hx. (+) Teeth Intact, Dental Advisory Given,    Pulmonary neg pulmonary ROS,    Pulmonary exam normal breath sounds clear to auscultation       Cardiovascular hypertension, Pt. on medications Normal cardiovascular exam Rhythm:Regular Rate:Normal  EKG: 10/11/20: NSR   CV: Nuclear stress test 04/12/17: The left ventricular ejection fraction is hyperdynamic (>65%). Nuclear stress EF: 73%. No T wave inversion was noted during stress. The study is normal. This is a low risk study.  Normal perfusion. LVEF 73% with normal wall motion. This is a low risk study.   Neuro/Psych MRI C-spine 08/30/20 (report in [facility]/PACS): IMPRESSION: 1. Since 2019 multifactorial cervical spinal stenosis has developed at C5-C6 and is mild-to-moderate along with cord mass effect. This is secondary to both increased ligament flavum hypertrophy and disc degeneration. No spinal cord signal abnormality is identified. Up to moderate left C6 foraminal stenosis appears stable. 2. Ligament flavum hypertrophy has also increased at C6-C7, but that level is otherwise stable with no significant spinal stenosis and unchanged mild to moderate left foraminal stenosis. 3. Mild cervical spine degeneration elsewhere is stable. == negative neurological ROS  negative psych ROS   GI/Hepatic Neg liver ROS, GERD  Poorly Controlled,  Endo/Other  diabetes, Type 2, Oral Hypoglycemic Agents  Renal/GU negative Renal ROS  negative genitourinary   Musculoskeletal  (+) Arthritis ,   Abdominal   Peds negative  pediatric ROS (+)  Hematology negative hematology ROS (+)   Anesthesia Other Findings   Reproductive/Obstetrics negative OB ROS                           Anesthesia Physical Anesthesia Plan  ASA: 3  Anesthesia Plan: General   Post-op Pain Management:    Induction: Intravenous  PONV Risk Score and Plan: 3 and Ondansetron, Dexamethasone and Treatment may vary due to age or medical condition  Airway Management Planned: Oral ETT and Video Laryngoscope Planned  Additional Equipment: None  Intra-op Plan:   Post-operative Plan: Extubation in OR  Informed Consent: I have reviewed the patients History and Physical, chart, labs and discussed the procedure including the risks, benefits and alternatives for the proposed anesthesia with the patient or authorized representative who has indicated his/her understanding and acceptance.     Dental advisory given  Plan Discussed with: CRNA and Anesthesiologist  Anesthesia Plan Comments: (PAT note written 10/12/2020 by Myra Gianotti, PA-C. )      Anesthesia Quick Evaluation

## 2020-10-12 NOTE — Progress Notes (Signed)
Anesthesia Chart Review:  Case: [id number] Date/Time: 10/14/20 0715   Procedure: ANTERIOR CERVICAL DECOMPRESSION FUSION CERVICAL 4 - CERVICAL 5, CERVICAL 5 - CERVICAL 6, CERVICAL 6- CERVICAL 7   Anesthesia type: General   Pre-op diagnosis: PROGRESSIVE CERVICAL MYELOPATHY   Location: [facility] OR ROOM 05 / [place]   Surgeons: Phylliss Bob, MD       DISCUSSION: Patient is a 70 year old female scheduled for the above procedure.  History includes never smoker, post-operative N/V, DM2, HTN, HLD, atypical chest pain (2019 with non-ischemic stress test), back pain, right ureteral stone (surgery, 2014).  Reported a negative sleep study over 10 years ago.  10/11/2020 presurgical COVID-19 test negative.  Anesthesia team to evaluate on the day of surgery.  VS: BP (!) 147/69   Pulse 70   Temp 36.8 C (Oral)   Resp 17   Ht 5\' 5"  (1.651 m)   Wt 76.8 kg   SpO2 100%   BMI 28.17 kg/m   PROVIDERS: Harlan Stains, MD is PCP  - She is not followed routinely by cardiology, but had an evaluation by Skeet Latch, MD in 2019 for exertional dyspnea with atypical chest pain. Stress test was normal and as needed follow-up recommended.    LABS: Labs reviewed: Acceptable for surgery. UA result called to Butch Penny at Dr. Renetta Chalk will have him or his APP review for any recommendations.  (all labs ordered are listed, but only abnormal results are displayed)  Labs Reviewed  GLUCOSE, CAPILLARY - Abnormal; Notable for the following components:      Result Value   Glucose-Capillary 129 (*)    All other components within normal limits  HEMOGLOBIN A1C - Abnormal; Notable for the following components:   Hgb A1c MFr Bld 6.5 (*)    All other components within normal limits  CBC WITH DIFFERENTIAL/PLATELET - Abnormal; Notable for the following components:   Hemoglobin 15.5 (*)    All other components within normal limits  COMPREHENSIVE METABOLIC PANEL - Abnormal; Notable for the following components:    Glucose, Bld 134 (*)    All other components within normal limits  URINALYSIS, ROUTINE W REFLEX MICROSCOPIC - Abnormal; Notable for the following components:   APPearance HAZY (*)    Hgb urine dipstick SMALL (*)    Leukocytes,Ua LARGE (*)    Bacteria, UA RARE (*)    Non Squamous Epithelial 0-5 (*)    All other components within normal limits  SURGICAL PCR SCREEN  SARS CORONAVIRUS 2 (TAT 6-24 HRS)  PROTIME-INR  APTT  TYPE AND SCREEN     IMAGES: MRI C-spine 08/30/20 (report in [facility]/PACS): IMPRESSION: 1. Since 2019 multifactorial cervical spinal stenosis has developed at C5-C6 and is mild-to-moderate along with cord mass effect. This is secondary to both increased ligament flavum hypertrophy and disc degeneration. No spinal cord signal abnormality is identified. Up to moderate left C6 foraminal stenosis appears stable. 2. Ligament flavum hypertrophy has also increased at C6-C7, but that level is otherwise stable with no significant spinal stenosis and unchanged mild to moderate left foraminal stenosis. 3. Mild cervical spine degeneration elsewhere is stable.   EKG: 10/11/20: NSR   CV: Nuclear stress test 04/12/17: The left ventricular ejection fraction is hyperdynamic (>65%). Nuclear stress EF: 73%. No T wave inversion was noted during stress. The study is normal. This is a low risk study.   Normal perfusion. LVEF 73% with normal wall motion. This is a low risk study.  Past Medical History:  Diagnosis Date   Arthritis  Atypical chest pain 04/12/2017   Back pain    Diabetes mellitus type 2 in obese ([place]) 04/12/2017   Dysuria    Frequency of urination    GERD (gastroesophageal reflux disease)    WATCHES DIET   Hematuria    History of kidney problems    History of kidney stones    Hyperlipidemia 04/12/2017   Hypertension    Nocturia    PONV (postoperative nausea and vomiting)    Prediabetes    DIET CONTROLLED   Right ureteral stone    Urgency of urination     Vitamin D deficiency    Wears glasses     Past Surgical History:  Procedure Laterality Date   CHOLECYSTECTOMY  2000  (APPROX)   CYSTOSCOPY W/ URETERAL STENT PLACEMENT Right 12/13/2012   Procedure: CYSTOSCOPY/RIGHT URETERAL STENT EXCHANGE/RIGHT RETROGRADE PYELOGRAM;  Surgeon: [name], MD;  Location[facility];  Service: Urology;  Laterality: Right;   CYSTOSCOPY WITH URETEROSCOPY Right 11/27/2012   Procedure: CYSTOSCOPY WITH RIGHT  URETEROSCOPY AND STENT PLACEMENT ;  Surgeon: [name], MD;  Location: [facility];  Service: Urology;  Laterality: Right;   EXTRACORPOREAL SHOCK WAVE LITHOTRIPSY  1994  (APPROX)   HOLMIUM LASER APPLICATION Right 10/29/8848   Procedure: HOLMIUM LASER APPLICATION;  Surgeon: [name], MD;  Location: [facility];  Service: Urology;  Laterality: Right;   HOLMIUM LASER APPLICATION Right 2/77/4128   Procedure: HOLMIUM LASER APPLICATION;  Surgeon: [name], MD;  Location: [facility];  Service: Urology;  Laterality: Right;   URETEROSCOPY Right 12/13/2012   Procedure: URETEROSCOPY;  Surgeon: [name], MD;  Location: [facility];  Service: Urology;  Laterality: Right;    MEDICATIONS:  cholecalciferol (VITAMIN D3) 25 MCG (1000 UNIT) tablet   Cyanocobalamin (B-12) 5000 MCG CAPS   glucose blood (IGLUCOSE TEST STRIPS) test strip   icosapent Ethyl (VASCEPA) 1 g capsule   losartan (COZAAR) 100 MG tablet   metFORMIN (GLUCOPHAGE) 500 MG tablet   metFORMIN (GLUCOPHAGE-XR) 500 MG 24 hr tablet   rosuvastatin (CRESTOR) 5 MG tablet   No current facility-administered medications for this encounter.    Myra Gianotti, PA-C Surgical Short Stay/Anesthesiology [facility] Phone [phone] [facility] Phone [phone] 10/12/2020 1:43 PM

## 2020-10-14 ENCOUNTER — Encounter (HOSPITAL_COMMUNITY): Payer: Self-pay | Admitting: Orthopedic Surgery

## 2020-10-14 ENCOUNTER — Other Ambulatory Visit: Payer: Self-pay

## 2020-10-14 ENCOUNTER — Ambulatory Visit (HOSPITAL_COMMUNITY): Payer: PPO | Admitting: Anesthesiology

## 2020-10-14 ENCOUNTER — Ambulatory Visit (HOSPITAL_COMMUNITY)
Admission: RE | Admit: 2020-10-14 | Discharge: 2020-10-14 | Disposition: A | Discharge status: Home / Self Care | Payer: PPO | Attending: Orthopedic Surgery | Admitting: Orthopedic Surgery

## 2020-10-14 ENCOUNTER — Ambulatory Visit (HOSPITAL_COMMUNITY)
Admission: RE | Disposition: A | Discharge status: Home / Self Care | Payer: Self-pay | Source: Home / Self Care | Attending: Orthopedic Surgery

## 2020-10-14 ENCOUNTER — Ambulatory Visit (HOSPITAL_COMMUNITY): Payer: PPO

## 2020-10-14 ENCOUNTER — Ambulatory Visit (HOSPITAL_COMMUNITY): Payer: PPO | Admitting: Vascular Surgery

## 2020-10-14 DIAGNOSIS — Z7984 Long term (current) use of oral hypoglycemic drugs: Secondary | ICD-10-CM | POA: Insufficient documentation

## 2020-10-14 DIAGNOSIS — Z981 Arthrodesis status: Secondary | ICD-10-CM | POA: Diagnosis not present

## 2020-10-14 DIAGNOSIS — G959 Disease of spinal cord, unspecified: Secondary | ICD-10-CM | POA: Diagnosis not present

## 2020-10-14 DIAGNOSIS — M50022 Cervical disc disorder at C5-C6 level with myelopathy: Secondary | ICD-10-CM | POA: Diagnosis not present

## 2020-10-14 DIAGNOSIS — G9529 Other cord compression: Secondary | ICD-10-CM | POA: Diagnosis not present

## 2020-10-14 DIAGNOSIS — M50021 Cervical disc disorder at C4-C5 level with myelopathy: Secondary | ICD-10-CM | POA: Diagnosis not present

## 2020-10-14 DIAGNOSIS — Z79899 Other long term (current) drug therapy: Secondary | ICD-10-CM | POA: Diagnosis not present

## 2020-10-14 DIAGNOSIS — Z888 Allergy status to other drugs, medicaments and biological substances status: Secondary | ICD-10-CM | POA: Insufficient documentation

## 2020-10-14 DIAGNOSIS — G9520 Unspecified cord compression: Secondary | ICD-10-CM | POA: Diagnosis not present

## 2020-10-14 DIAGNOSIS — M50023 Cervical disc disorder at C6-C7 level with myelopathy: Secondary | ICD-10-CM | POA: Diagnosis not present

## 2020-10-14 DIAGNOSIS — E119 Type 2 diabetes mellitus without complications: Secondary | ICD-10-CM | POA: Diagnosis not present

## 2020-10-14 DIAGNOSIS — Z419 Encounter for procedure for purposes other than remedying health state, unspecified: Secondary | ICD-10-CM

## 2020-10-14 DIAGNOSIS — M4322 Fusion of spine, cervical region: Secondary | ICD-10-CM | POA: Diagnosis not present

## 2020-10-14 HISTORY — PX: ANTERIOR CERVICAL DECOMPRESSION/DISCECTOMY FUSION 4 LEVELS: SHX5556

## 2020-10-14 LAB — GLUCOSE, CAPILLARY
Glucose-Capillary: 143 mg/dL — ABNORMAL HIGH (ref 70–99)
Glucose-Capillary: 144 mg/dL — ABNORMAL HIGH (ref 70–99)

## 2020-10-14 LAB — ABO/RH: ABO/RH(D): O POS

## 2020-10-14 SURGERY — ANTERIOR CERVICAL DECOMPRESSION/DISCECTOMY FUSION 4 LEVELS
Anesthesia: General

## 2020-10-14 MED ORDER — FENTANYL CITRATE (PF) 100 MCG/2ML IJ SOLN
25.0000 ug | INTRAMUSCULAR | Status: DC | PRN
  Administered 2020-10-14 (×3): 50 ug via INTRAVENOUS

## 2020-10-14 MED ORDER — LIDOCAINE 2% (20 MG/ML) 5 ML SYRINGE
INTRAMUSCULAR | Status: DC | PRN
Start: 2020-10-14 — End: 2020-10-14
  Administered 2020-10-14: 60 mg via INTRAVENOUS

## 2020-10-14 MED ORDER — OXYCODONE HCL 5 MG PO TABS
5.0000 mg | ORAL_TABLET | Freq: Once | ORAL | Status: AC | PRN
Start: 2020-10-14 — End: 2020-10-14
  Administered 2020-10-14: 5 mg via ORAL

## 2020-10-14 MED ORDER — ORAL CARE MOUTH RINSE: 15.0000 mL | Freq: Once | OROMUCOSAL | Status: AC

## 2020-10-14 MED ORDER — MIDAZOLAM HCL 2 MG/2ML IJ SOLN
INTRAMUSCULAR | Status: AC
  Filled 2020-10-14: qty 2

## 2020-10-14 MED ORDER — ACETAMINOPHEN 325 MG PO TABS: 325.0000 mg | ORAL_TABLET | ORAL | Status: DC | PRN

## 2020-10-14 MED ORDER — LACTATED RINGERS IV SOLN: INTRAVENOUS | Status: DC

## 2020-10-14 MED ORDER — FENTANYL CITRATE (PF) 100 MCG/2ML IJ SOLN
50.0000 ug | Freq: Once | INTRAMUSCULAR | Status: AC
  Administered 2020-10-14: 50 ug via INTRAVENOUS

## 2020-10-14 MED ORDER — BUPIVACAINE-EPINEPHRINE (PF) 0.25% -1:200000 IJ SOLN
INTRAMUSCULAR | Status: AC
  Filled 2020-10-14: qty 30

## 2020-10-14 MED ORDER — ONDANSETRON HCL 4 MG/2ML IJ SOLN
INTRAMUSCULAR | Status: AC
  Filled 2020-10-14: qty 2

## 2020-10-14 MED ORDER — ROCURONIUM BROMIDE 10 MG/ML (PF) SYRINGE
PREFILLED_SYRINGE | INTRAVENOUS | Status: AC
  Filled 2020-10-14: qty 10

## 2020-10-14 MED ORDER — CEFAZOLIN SODIUM-DEXTROSE 2-4 GM/100ML-% IV SOLN
2.0000 g | INTRAVENOUS | Status: AC
Start: 2020-10-14 — End: 2020-10-14
  Administered 2020-10-14: 2 g via INTRAVENOUS
  Filled 2020-10-14: qty 100

## 2020-10-14 MED ORDER — FENTANYL CITRATE (PF) 100 MCG/2ML IJ SOLN
INTRAMUSCULAR | Status: AC
  Filled 2020-10-14: qty 2

## 2020-10-14 MED ORDER — PHENYLEPHRINE HCL-NACL 10-0.9 MG/250ML-% IV SOLN
INTRAVENOUS | Status: DC | PRN
  Administered 2020-10-14: 50 ug/min via INTRAVENOUS

## 2020-10-14 MED ORDER — BUPIVACAINE-EPINEPHRINE 0.25% -1:200000 IJ SOLN
INTRAMUSCULAR | Status: DC | PRN
  Administered 2020-10-14: 6 mL

## 2020-10-14 MED ORDER — LIDOCAINE 2% (20 MG/ML) 5 ML SYRINGE
INTRAMUSCULAR | Status: AC
  Filled 2020-10-14: qty 5

## 2020-10-14 MED ORDER — DEXAMETHASONE SODIUM PHOSPHATE 10 MG/ML IJ SOLN
INTRAMUSCULAR | Status: AC
  Filled 2020-10-14: qty 1

## 2020-10-14 MED ORDER — ROCURONIUM BROMIDE 10 MG/ML (PF) SYRINGE
PREFILLED_SYRINGE | INTRAVENOUS | Status: DC | PRN
  Administered 2020-10-14: 10 mg via INTRAVENOUS
  Administered 2020-10-14: 70 mg via INTRAVENOUS
  Administered 2020-10-14: 10 mg via INTRAVENOUS

## 2020-10-14 MED ORDER — PROMETHAZINE HCL 25 MG/ML IJ SOLN
INTRAMUSCULAR | Status: AC
  Filled 2020-10-14: qty 1

## 2020-10-14 MED ORDER — DEXAMETHASONE SODIUM PHOSPHATE 10 MG/ML IJ SOLN
10.0000 mg | Freq: Once | INTRAMUSCULAR | Status: AC
  Administered 2020-10-14: 10 mg via INTRAVENOUS

## 2020-10-14 MED ORDER — ONDANSETRON HCL 4 MG/2ML IJ SOLN
INTRAMUSCULAR | Status: DC | PRN
Start: 2020-10-14 — End: 2020-10-14
  Administered 2020-10-14: 4 mg via INTRAVENOUS

## 2020-10-14 MED ORDER — MIDAZOLAM HCL 2 MG/2ML IJ SOLN
INTRAMUSCULAR | Status: DC | PRN
  Administered 2020-10-14: 2 mg via INTRAVENOUS

## 2020-10-14 MED ORDER — AMISULPRIDE (ANTIEMETIC) 5 MG/2ML IV SOLN
10.0000 mg | Freq: Once | INTRAVENOUS | Status: AC
  Administered 2020-10-14: 10 mg via INTRAVENOUS

## 2020-10-14 MED ORDER — CHLORHEXIDINE GLUCONATE 0.12 % MT SOLN
15.0000 mL | Freq: Once | OROMUCOSAL | Status: AC
  Administered 2020-10-14: 15 mL via OROMUCOSAL
  Filled 2020-10-14: qty 15

## 2020-10-14 MED ORDER — METHOCARBAMOL 500 MG PO TABS
500.0000 mg | ORAL_TABLET | Freq: Four times a day (QID) | ORAL | 0 refills | Status: DC | PRN
Start: 2020-10-14 — End: 2021-04-28

## 2020-10-14 MED ORDER — FENTANYL CITRATE (PF) 250 MCG/5ML IJ SOLN
INTRAMUSCULAR | Status: DC | PRN
  Administered 2020-10-14: 100 ug via INTRAVENOUS

## 2020-10-14 MED ORDER — METHOCARBAMOL 500 MG PO TABS
ORAL_TABLET | ORAL | Status: AC
  Filled 2020-10-14: qty 1

## 2020-10-14 MED ORDER — THROMBIN 20000 UNITS EX SOLR
CUTANEOUS | Status: DC | PRN
  Administered 2020-10-14: 20000 [IU] via TOPICAL

## 2020-10-14 MED ORDER — AMISULPRIDE (ANTIEMETIC) 5 MG/2ML IV SOLN
INTRAVENOUS | Status: AC
  Filled 2020-10-14: qty 2

## 2020-10-14 MED ORDER — HYDROCODONE-ACETAMINOPHEN 5-325 MG PO TABS: 1.0000 | ORAL_TABLET | Freq: Four times a day (QID) | ORAL | 0 refills | Status: DC | PRN

## 2020-10-14 MED ORDER — THROMBIN (RECOMBINANT) 20000 UNITS EX SOLR
CUTANEOUS | Status: AC
  Filled 2020-10-14: qty 20000

## 2020-10-14 MED ORDER — OXYCODONE HCL 5 MG PO TABS
ORAL_TABLET | ORAL | Status: AC
  Filled 2020-10-14: qty 1

## 2020-10-14 MED ORDER — SCOPOLAMINE 1 MG/3DAYS TD PT72
MEDICATED_PATCH | TRANSDERMAL | Status: AC
  Filled 2020-10-14: qty 1

## 2020-10-14 MED ORDER — MEPERIDINE HCL 25 MG/ML IJ SOLN: 6.2500 mg | INTRAMUSCULAR | Status: DC | PRN

## 2020-10-14 MED ORDER — ONDANSETRON HCL 4 MG/2ML IJ SOLN
4.0000 mg | Freq: Once | INTRAMUSCULAR | Status: AC | PRN
  Administered 2020-10-14: 4 mg via INTRAVENOUS

## 2020-10-14 MED ORDER — METHOCARBAMOL 500 MG PO TABS
500.0000 mg | ORAL_TABLET | Freq: Once | ORAL | Status: AC
  Administered 2020-10-14: 500 mg via ORAL
  Filled 2020-10-14: qty 1

## 2020-10-14 MED ORDER — ALBUMIN HUMAN 5 % IV SOLN: INTRAVENOUS | Status: DC | PRN

## 2020-10-14 MED ORDER — ACETAMINOPHEN 160 MG/5ML PO SOLN: 325.0000 mg | ORAL | Status: DC | PRN

## 2020-10-14 MED ORDER — ACETAMINOPHEN 500 MG PO TABS
ORAL_TABLET | ORAL | Status: AC
  Filled 2020-10-14: qty 2

## 2020-10-14 MED ORDER — OXYCODONE HCL 5 MG/5ML PO SOLN: 5.0000 mg | Freq: Once | ORAL | Status: AC | PRN

## 2020-10-14 MED ORDER — ACETAMINOPHEN 325 MG PO TABS
ORAL_TABLET | ORAL | Status: DC | PRN
  Administered 2020-10-14: 1000 mg via ORAL

## 2020-10-14 MED ORDER — SCOPOLAMINE 1 MG/3DAYS TD PT72
MEDICATED_PATCH | TRANSDERMAL | Status: DC | PRN
  Administered 2020-10-14: 1 via TRANSDERMAL

## 2020-10-14 MED ORDER — PROPOFOL 10 MG/ML IV BOLUS
INTRAVENOUS | Status: AC
  Filled 2020-10-14: qty 20

## 2020-10-14 MED ORDER — POVIDONE-IODINE 7.5 % EX SOLN: Freq: Once | CUTANEOUS | Status: DC

## 2020-10-14 MED ORDER — FENTANYL CITRATE (PF) 250 MCG/5ML IJ SOLN
INTRAMUSCULAR | Status: AC
  Filled 2020-10-14: qty 5

## 2020-10-14 MED ORDER — PROPOFOL 10 MG/ML IV BOLUS
INTRAVENOUS | Status: DC | PRN
  Administered 2020-10-14: 150 mg via INTRAVENOUS

## 2020-10-14 MED ORDER — 0.9 % SODIUM CHLORIDE (POUR BTL) OPTIME
TOPICAL | Status: DC | PRN
  Administered 2020-10-14: 1000 mL

## 2020-10-14 MED ORDER — PROMETHAZINE HCL 25 MG/ML IJ SOLN
6.2500 mg | Freq: Once | INTRAMUSCULAR | Status: AC
  Administered 2020-10-14: 6.25 mg via INTRAVENOUS

## 2020-10-14 MED ORDER — SUGAMMADEX SODIUM 200 MG/2ML IV SOLN
INTRAVENOUS | Status: DC | PRN
  Administered 2020-10-14: 200 mg via INTRAVENOUS

## 2020-10-14 SURGICAL SUPPLY — 68 items
BAG COUNTER SPONGE SURGICOUNT (BAG) ×2 IMPLANT
BENZOIN TINCTURE PRP APPL 2/3 (GAUZE/BANDAGES/DRESSINGS) ×2 IMPLANT
BIT DRILL NEURO 2X3.1 SFT TUCH (MISCELLANEOUS) ×1 IMPLANT
BIT DRILL SRG 14X2.2XFLT CHK (BIT) ×1 IMPLANT
BIT DRL SRG 14X2.2XFLT CHK (BIT) ×1
BLADE CLIPPER SURG (BLADE) ×2 IMPLANT
BLADE SURG 15 STRL LF DISP TIS (BLADE) ×1 IMPLANT
BLADE SURG 15 STRL SS (BLADE) ×2
BONE VIVIGEN FORMABLE 1.3CC (Bone Implant) ×4 IMPLANT
BUR MATCHSTICK NEURO 3.0 LAGG (BURR) ×2 IMPLANT
CARTRIDGE OIL MAESTRO DRILL (MISCELLANEOUS) ×1 IMPLANT
CLSR STERI-STRIP ANTIMIC 1/2X4 (GAUZE/BANDAGES/DRESSINGS) ×2 IMPLANT
COVER SURGICAL LIGHT HANDLE (MISCELLANEOUS) ×2 IMPLANT
DECANTER SPIKE VIAL GLASS SM (MISCELLANEOUS) ×2 IMPLANT
DIFFUSER DRILL AIR PNEUMATIC (MISCELLANEOUS) ×2 IMPLANT
DRAPE C-ARM 42X72 X-RAY (DRAPES) ×2 IMPLANT
DRAPE POUCH INSTRU U-SHP 10X18 (DRAPES) ×2 IMPLANT
DRAPE SURG 17X23 STRL (DRAPES) ×6 IMPLANT
DRILL BIT SKYLINE 14MM (BIT) ×2
DRILL NEURO 2X3.1 SOFT TOUCH (MISCELLANEOUS) ×2
DURAPREP 26ML APPLICATOR (WOUND CARE) ×2 IMPLANT
ELECT COATED BLADE 2.86 ST (ELECTRODE) ×2 IMPLANT
ELECT REM PT RETURN 9FT ADLT (ELECTROSURGICAL) ×2
ELECTRODE REM PT RTRN 9FT ADLT (ELECTROSURGICAL) ×1 IMPLANT
GAUZE 4X4 16PLY ~~LOC~~+RFID DBL (SPONGE) ×2 IMPLANT
GAUZE SPONGE 4X4 12PLY STRL (GAUZE/BANDAGES/DRESSINGS) ×2 IMPLANT
GLOVE SRG 8 PF TXTR STRL LF DI (GLOVE) ×1 IMPLANT
GLOVE SURG ENC MOIS LTX SZ7 (GLOVE) ×2 IMPLANT
GLOVE SURG ENC MOIS LTX SZ8 (GLOVE) ×2 IMPLANT
GLOVE SURG UNDER POLY LF SZ7 (GLOVE) ×4 IMPLANT
GLOVE SURG UNDER POLY LF SZ8 (GLOVE) ×2
GOWN STRL REUS W/ TWL LRG LVL3 (GOWN DISPOSABLE) ×1 IMPLANT
GOWN STRL REUS W/ TWL XL LVL3 (GOWN DISPOSABLE) ×1 IMPLANT
GOWN STRL REUS W/TWL LRG LVL3 (GOWN DISPOSABLE) ×2
GOWN STRL REUS W/TWL XL LVL3 (GOWN DISPOSABLE) ×2
INTERLOCK LRDTC CRVCL VBR 7MM (Bone Implant) ×3 IMPLANT
IV CATH 14GX2 1/4 (CATHETERS) ×2 IMPLANT
KIT BASIN OR (CUSTOM PROCEDURE TRAY) ×2 IMPLANT
KIT TURNOVER KIT B (KITS) ×2 IMPLANT
LORDOTIC CERVICAL VBR 7MM SM (Bone Implant) ×6 IMPLANT
MANIFOLD NEPTUNE II (INSTRUMENTS) ×2 IMPLANT
NEEDLE PRECISIONGLIDE 27X1.5 (NEEDLE) ×2 IMPLANT
NEEDLE SPNL 20GX3.5 QUINCKE YW (NEEDLE) ×2 IMPLANT
NS IRRIG 1000ML POUR BTL (IV SOLUTION) ×2 IMPLANT
OIL CARTRIDGE MAESTRO DRILL (MISCELLANEOUS) ×2
PACK ORTHO CERVICAL (CUSTOM PROCEDURE TRAY) ×2 IMPLANT
PAD ARMBOARD 7.5X6 YLW CONV (MISCELLANEOUS) ×4 IMPLANT
PATTIES SURGICAL .5 X.5 (GAUZE/BANDAGES/DRESSINGS) ×2 IMPLANT
PIN DISTRACTION 14 (PIN) ×4 IMPLANT
PLATE SKYLINE 3 LVL 48MM (Plate) ×2 IMPLANT
POSITIONER HEAD DONUT 9IN (MISCELLANEOUS) ×2 IMPLANT
SCREW SKYLINE VAR OS 14MM (Screw) ×14 IMPLANT
SCREW VAR SELF TAP SKYLINE 14M (Screw) ×2 IMPLANT
SPONGE INTESTINAL PEANUT (DISPOSABLE) ×4 IMPLANT
SPONGE SURGIFOAM ABS GEL 100 (HEMOSTASIS) ×2 IMPLANT
STRIP CLOSURE SKIN 1/2X4 (GAUZE/BANDAGES/DRESSINGS) ×2 IMPLANT
SURGIFLO W/THROMBIN 8M KIT (HEMOSTASIS) ×2 IMPLANT
SUT MNCRL AB 4-0 PS2 18 (SUTURE) ×2 IMPLANT
SUT VIC AB 2-0 CT2 18 VCP726D (SUTURE) ×2 IMPLANT
SYR BULB IRRIG 60ML STRL (SYRINGE) ×2 IMPLANT
SYR CONTROL 10ML LL (SYRINGE) ×4 IMPLANT
TAPE CLOTH 4X10 WHT NS (GAUZE/BANDAGES/DRESSINGS) ×2 IMPLANT
TAPE CLOTH SURG 4X10 WHT LF (GAUZE/BANDAGES/DRESSINGS) ×2 IMPLANT
TAPE UMBILICAL COTTON 1/8X30 (MISCELLANEOUS) ×2 IMPLANT
TOWEL GREEN STERILE (TOWEL DISPOSABLE) ×2 IMPLANT
TOWEL GREEN STERILE FF (TOWEL DISPOSABLE) ×2 IMPLANT
WATER STERILE IRR 1000ML POUR (IV SOLUTION) ×2 IMPLANT
YANKAUER SUCT BULB TIP NO VENT (SUCTIONS) ×2 IMPLANT

## 2020-10-14 NOTE — H&P (Addendum)
PREOPERATIVE H&P  Chief Complaint: Bilateral arm and hand weakness and tingling   HPI: Lauren James is a 70 y.o. female who presents with progressive arm and hand weakness and tingling   MRI reveals spinal cord compression spanning C4-C7  Patient has failed multiple forms of conservative care and continues to have pain (see office notes for additional details regarding the patient's full course of treatment)  Past Medical History:  Diagnosis Date   Arthritis    Atypical chest pain 04/12/2017   Back pain    Diabetes mellitus type 2 in obese ([place]) 04/12/2017   Dysuria    Frequency of urination    GERD (gastroesophageal reflux disease)    WATCHES DIET   Hematuria    History of kidney problems    History of kidney stones    Hyperlipidemia 04/12/2017   Hypertension    Nocturia    PONV (postoperative nausea and vomiting)    Prediabetes    DIET CONTROLLED   Right ureteral stone    Urgency of urination    Vitamin D deficiency    Wears glasses    Past Surgical History:  Procedure Laterality Date   CHOLECYSTECTOMY  2000  (APPROX)   CYSTOSCOPY W/ URETERAL STENT PLACEMENT Right 12/13/2012   Procedure: CYSTOSCOPY/RIGHT URETERAL STENT EXCHANGE/RIGHT RETROGRADE PYELOGRAM;  Surgeon: [name], MD;  Location: [facility];  Service: Urology;  Laterality: Right;   CYSTOSCOPY WITH URETEROSCOPY Right 11/27/2012   Procedure: CYSTOSCOPY WITH RIGHT  URETEROSCOPY AND STENT PLACEMENT ;  Surgeon: [name], MD;  Location: [facility];  Service: Urology;  Laterality: Right;   EXTRACORPOREAL SHOCK WAVE LITHOTRIPSY  1994  (APPROX)   HOLMIUM LASER APPLICATION Right 09/27/812   Procedure: HOLMIUM LASER APPLICATION;  Surgeon: [name], MD;  Location: [facility];  Service: Urology;  Laterality: Right;   HOLMIUM LASER APPLICATION Right 4/81/8563   Procedure: HOLMIUM LASER APPLICATION;  Surgeon: [name], MD;  Location: [facility];  Service: Urology;   Laterality: Right;   URETEROSCOPY Right 12/13/2012   Procedure: URETEROSCOPY;  Surgeon: [name], MD;  Location: [facility];  Service: Urology;  Laterality: Right;   Social History   Socioeconomic History   Marital status: Married    Spouse name: Chrissie Noa   Number of children: Not on file   Years of education: Not on file   Highest education level: Not on file  Occupational History   Occupation: Press photographer, Retired  Tobacco Use   Smoking status: Never   Smokeless tobacco: Never  Vaping Use   Vaping Use: Never used  Substance and Sexual Activity   Alcohol use: No   Drug use: No   Sexual activity: Not on file  Other Topics Concern   Not on file  Social History Narrative   Not on file   Social Determinants of Health   Financial Resource Strain: Not on file  Food Insecurity: Not on file  Transportation Needs: Not on file  Physical Activity: Not on file  Stress: Not on file  Social Connections: Not on file   Family History  Problem Relation Age of Onset   Diabetes Mother    Kidney disease Mother    Heart disease Mother    High blood pressure Mother    Stroke Mother    Obesity Mother    Dementia Father    Stroke Father    Heart attack Sister    Diabetes Sister  Diabetes Maternal Grandmother    Heart attack Maternal Grandfather    Alcoholism Paternal Grandfather    Diabetes Sister    Osteoporosis Sister    Allergies  Allergen Reactions   Adhesive [Tape] Itching and Other (See Comments)    BLISTERS, if worn for a long period of time   Prior to Admission medications   Medication Sig Start Date End Date Taking? Authorizing Provider  cholecalciferol (VITAMIN D3) 25 MCG (1000 UNIT) tablet Take 1,000 Units by mouth daily.   Yes [provider]  Cyanocobalamin (B-12) 5000 MCG CAPS Take 5,000 mcg by mouth daily.   Yes [provider]  icosapent Ethyl (VASCEPA) 1 g capsule Take 2 g by mouth daily.   Yes [provider]  losartan (COZAAR) 100 MG tablet Take 100 mg by mouth daily.   Yes [provider]  metFORMIN (GLUCOPHAGE-XR) 500 MG 24 hr tablet Take 1,000-1,500 mg by mouth See admin instructions. Take 1000 mg by mouth in the morning, may take an additional 5 mg if needed for blood sugar of 180 or higher 07/12/20  Yes [provider]  rosuvastatin (CRESTOR) 5 MG tablet Take 5 mg by mouth once a week. 03/21/17  Yes [provider]  glucose blood (IGLUCOSE TEST STRIPS) test strip Twice daily (use Bayer brand or whichever ins covers) 03/07/18   Jearld Lesch A, DO  metFORMIN (GLUCOPHAGE) 500 MG tablet Take 1,000 mg by mouth daily with breakfast. Patient not taking: No sig reported    [provider]     All other systems have been reviewed and were otherwise negative with the exception of those mentioned in the HPI and as above.  Physical Exam: Vitals:   10/14/20 0549  BP: 134/67  Pulse: 67  Resp: 17  Temp: 97.7 F (36.5 C)  SpO2: 98%    Body mass index is 28.17 kg/m.  General: Alert, no acute distress Cardiovascular: No pedal edema Respiratory: No cyanosis, no use of accessory musculature Skin: No lesions in the area of chief complaint Neurologic: Sensation intact distally Psychiatric: Patient is competent for consent with normal mood and affect Lymphatic: No axillary or cervical lymphadenopathy   Assessment/Plan: PROGRESSIVE CERVICAL MYELOPATHY Plan for Procedure(s): ANTERIOR CERVICAL DECOMPRESSION FUSION CERVICAL 4 - CERVICAL 5, CERVICAL 5 - CERVICAL 6, CERVICAL 6- CERVICAL 7   Norva Karvonen, MD 10/14/2020 6:22 AM

## 2020-10-14 NOTE — Anesthesia Procedure Notes (Signed)
Procedure Name: Intubation Date/Time: 10/14/2020 7:45 AM Performed by: Michele Rockers, CRNA Pre-anesthesia Checklist: Patient identified, Patient being monitored, Timeout performed, Emergency Drugs available and Suction available Patient Re-evaluated:Patient Re-evaluated prior to induction Oxygen Delivery Method: Circle System Utilized Preoxygenation: Pre-oxygenation with 100% oxygen Induction Type: IV induction Ventilation: Mask ventilation without difficulty Laryngoscope Size: Miller and 2 Grade View: Grade I Tube type: Oral Tube size: 7.5 mm Number of attempts: 1 Airway Equipment and Method: Stylet Placement Confirmation: ETT inserted through vocal cords under direct vision, positive ETCO2 and breath sounds checked- equal and bilateral Secured at: 21 cm Tube secured with: Tape Dental Injury: Teeth and Oropharynx as per pre-operative assessment

## 2020-10-14 NOTE — Op Note (Signed)
PATIENT NAME: Lauren James   MEDICAL RECORD NO.:   [id number]    DATE OF BIRTH: [birth year]   DATE OF PROCEDURE: 10/14/2020                               OPERATIVE REPORT     PREOPERATIVE DIAGNOSES: 1. Progressive cervical myelopathy 2. Spinal cord compression spanning C4-C7.   POSTOPERATIVE DIAGNOSES: 1. Progressive cervical myelopathy 2. Spinal cord compression spanning C4-C7.   PROCEDURE: 1. Anterior cervical decompression and fusion C4/5, C5/6, C6/7. 2. Placement of anterior instrumentation, C4-C7. 3. Insertion of interbody device x 3 (Titan intervertebral spacers). 4. Intraoperative use of fluoroscopy. 5. Use of morselized allograft - ViviGen.   SURGEON:  Phylliss Bob, MD   ASSISTANT:  Pricilla Holm, PA-C.   ANESTHESIA:  General endotracheal anesthesia.   COMPLICATIONS:  None.   DISPOSITION:  Stable.   ESTIMATED BLOOD LOSS:  Minimal.   INDICATIONS FOR SURGERY:  Briefly, Lauren James is a pleasant 70 y.o. year- old patient, who did present to me with progressive cervical myelopathy.  The patient's MRI did reveal the findings noted above.  Given the patient's ongoing symptoms and MRI findings, we did discuss proceeding with the procedure noted above.  The patient was fully aware of the risks and limitations of surgery as outlined in my preoperative note.   OPERATIVE DETAILS:  On 10/14/2020, the patient was brought to surgery and general endotracheal anesthesia was administered.  The patient was placed supine on the hospital bed. The neck was gently extended.  All bony prominences were meticulously padded.  The neck was prepped and draped in the usual sterile fashion.  At this point, I did make a left-sided transverse incision.  The platysma was incised.  A Smith-Robinson approach was used and the anterior spine was identified. A self-retaining retractor was placed.  I then subperiosteally exposed the vertebral bodies from C4-C7.  Caspar pins were then placed into  the C6 and C7 vertebral bodies and distraction was applied.  A thorough and complete C6-7 intervertebral diskectomy was performed.  The posterior longitudinal ligament was identified and entered using a nerve hook.  I then used #1 followed by #2 Kerrison to perform a thorough and complete intervertebral diskectomy.  The spinal canal was thoroughly decompressed, as was the right and left neuroforamen.  The endplates were then prepared and the appropriate-sized intervertebral spacer was then packed with ViviGen and tamped into position in the usual fashion.  The lower Caspar pin was then removed and placed into the C5 vertebral body and once again, distraction was applied across the C5-6 intervertebral space.  I then again performed a thorough and complete diskectomy, thoroughly decompressing the spinal canal and bilateral neuroforamena.  After preparing the endplates, the appropriate-sized intervertebral spacer was packed with ViviGen and tamped into position.  The lower Caspar pin was then removed and placed into the C4 vertebral body and once again, distraction was applied across the C4-5 intervertebral space.  I then again performed a thorough and complete diskectomy, thoroughly decompressing the spinal canal and bilateral neuroforamena.  After preparing the endplates, the appropriate-sized intervertebral spacer was packed with ViviGen and tamped into position.  The Caspar pins then were removed and bone wax was placed in their place.  The appropriate-sized anterior cervical plate was placed over the anterior spine.  14 mm variable angle screws were placed, 2 in each vertebral body from C4-C7 for a total of 8  vertebral body screws.  The screws were then locked to the plate using the Cam locking mechanism.  I was very pleased with the final fluoroscopic images.  The wound was then irrigated.  The wound was then explored for any undue bleeding and there was no bleeding noted. The wound  was then closed in layers using 2-0 Vicryl, followed by 4-0 Monocryl.  Benzoin and Steri-Strips were applied, followed by sterile dressing.  All instrument counts were correct at the termination of the procedure.   Of note, Pricilla Holm, PA-C, was my assistant throughout surgery, and did aid in retraction, suctioning, placement of the hardware, and closure from start to finish.         Phylliss Bob, MD

## 2020-10-14 NOTE — Progress Notes (Signed)
Orthopedic Tech Progress Note Patient Details:  Lauren James 1950-04-28 [id number] New aspen cervical collar new given to patient's nurse due to the old collar being soiled.  Ortho Devices Type of Ortho Device: Aspen cervical collar Ortho Device/Splint Location: Neck Ortho Device/Splint Interventions: Ordered      Maxamus Colao E Pachia Strum 10/14/2020, 12:30 PM

## 2020-10-14 NOTE — Anesthesia Postprocedure Evaluation (Signed)
Anesthesia Post Note  Patient: Joani B Jobst  Procedure(s) Performed: ANTERIOR CERVICAL DECOMPRESSION FUSION CERVICAL 4 - CERVICAL 5, CERVICAL 5 - CERVICAL 6, CERVICAL 6- CERVICAL 7     Patient location during evaluation: PACU Anesthesia Type: General Level of consciousness: awake and alert Pain management: pain level controlled Vital Signs Assessment: post-procedure vital signs reviewed and stable Respiratory status: spontaneous breathing, nonlabored ventilation, respiratory function stable and patient connected to nasal cannula oxygen Cardiovascular status: blood pressure returned to baseline and stable Postop Assessment: no apparent nausea or vomiting Anesthetic complications: no   No notable events documented.  Last Vitals:  Vitals:   10/14/20 1334 10/14/20 1403  BP: 127/61 126/66  Pulse: 73 70  Resp: 16 18  Temp:    SpO2: 98% 98%    Last Pain:  Vitals:   10/14/20 1403  TempSrc:   PainSc: 5                  Hollister Wessler

## 2020-10-14 NOTE — Transfer of Care (Signed)
Immediate Anesthesia Transfer of Care Note  Patient: Lauren James  Procedure(s) Performed: ANTERIOR CERVICAL DECOMPRESSION FUSION CERVICAL 4 - CERVICAL 5, CERVICAL 5 - CERVICAL 6, CERVICAL 6- CERVICAL 7  Patient Location: PACU  Anesthesia Type:General  Level of Consciousness: awake, alert , patient cooperative and responds to stimulation  Airway & Oxygen Therapy: Patient Spontanous Breathing and Patient connected to nasal cannula oxygen  Post-op Assessment: Report given to RN, Post -op Vital signs reviewed and stable and Patient moving all extremities X 4  Post vital signs: Reviewed and stable  Last Vitals:  Vitals Value Taken Time  BP 217/103 10/14/20 1039  Temp    Pulse 79 10/14/20 1040  Resp 14 10/14/20 1040  SpO2 100 % 10/14/20 1040  Vitals shown include unvalidated device data.  Last Pain:  Vitals:   10/14/20 0628  TempSrc:   PainSc: 3       Patients Stated Pain Goal: 3 (24/81/85 9093)  Complications: No notable events documented.

## 2020-10-15 ENCOUNTER — Encounter (HOSPITAL_COMMUNITY): Payer: Self-pay | Admitting: Orthopedic Surgery

## 2020-10-15 MED FILL — Thrombin (Recombinant) For Soln 20000 Unit: CUTANEOUS | Qty: 1 | Status: AC

## 2020-10-22 DIAGNOSIS — Z981 Arthrodesis status: Secondary | ICD-10-CM | POA: Diagnosis not present

## 2020-10-25 ENCOUNTER — Emergency Department (HOSPITAL_COMMUNITY): Payer: PPO

## 2020-10-25 ENCOUNTER — Encounter (HOSPITAL_COMMUNITY): Payer: Self-pay

## 2020-10-25 ENCOUNTER — Other Ambulatory Visit: Payer: Self-pay

## 2020-10-25 ENCOUNTER — Emergency Department (HOSPITAL_COMMUNITY)
Admission: EM | Admit: 2020-10-25 | Discharge: 2020-10-26 | Disposition: A | Discharge status: Home / Self Care | Payer: PPO | Attending: Emergency Medicine | Admitting: Emergency Medicine

## 2020-10-25 DIAGNOSIS — Z7984 Long term (current) use of oral hypoglycemic drugs: Secondary | ICD-10-CM | POA: Diagnosis not present

## 2020-10-25 DIAGNOSIS — Z79899 Other long term (current) drug therapy: Secondary | ICD-10-CM | POA: Insufficient documentation

## 2020-10-25 DIAGNOSIS — M50021 Cervical disc disorder at C4-C5 level with myelopathy: Secondary | ICD-10-CM | POA: Diagnosis not present

## 2020-10-25 DIAGNOSIS — E119 Type 2 diabetes mellitus without complications: Secondary | ICD-10-CM | POA: Insufficient documentation

## 2020-10-25 DIAGNOSIS — K5900 Constipation, unspecified: Secondary | ICD-10-CM | POA: Insufficient documentation

## 2020-10-25 DIAGNOSIS — M25512 Pain in left shoulder: Secondary | ICD-10-CM | POA: Diagnosis not present

## 2020-10-25 DIAGNOSIS — J90 Pleural effusion, not elsewhere classified: Secondary | ICD-10-CM | POA: Diagnosis not present

## 2020-10-25 DIAGNOSIS — R112 Nausea with vomiting, unspecified: Secondary | ICD-10-CM | POA: Diagnosis not present

## 2020-10-25 DIAGNOSIS — J9811 Atelectasis: Secondary | ICD-10-CM | POA: Diagnosis not present

## 2020-10-25 DIAGNOSIS — R109 Unspecified abdominal pain: Secondary | ICD-10-CM | POA: Diagnosis not present

## 2020-10-25 DIAGNOSIS — I10 Essential (primary) hypertension: Secondary | ICD-10-CM | POA: Insufficient documentation

## 2020-10-25 DIAGNOSIS — J690 Pneumonitis due to inhalation of food and vomit: Secondary | ICD-10-CM | POA: Diagnosis not present

## 2020-10-25 DIAGNOSIS — R0789 Other chest pain: Secondary | ICD-10-CM | POA: Insufficient documentation

## 2020-10-25 DIAGNOSIS — R079 Chest pain, unspecified: Secondary | ICD-10-CM | POA: Diagnosis not present

## 2020-10-25 DIAGNOSIS — K760 Fatty (change of) liver, not elsewhere classified: Secondary | ICD-10-CM | POA: Diagnosis not present

## 2020-10-25 DIAGNOSIS — R1012 Left upper quadrant pain: Secondary | ICD-10-CM | POA: Diagnosis not present

## 2020-10-25 DIAGNOSIS — Z9889 Other specified postprocedural states: Secondary | ICD-10-CM | POA: Diagnosis not present

## 2020-10-25 DIAGNOSIS — K449 Diaphragmatic hernia without obstruction or gangrene: Secondary | ICD-10-CM | POA: Diagnosis not present

## 2020-10-25 LAB — URINALYSIS, ROUTINE W REFLEX MICROSCOPIC
Bacteria, UA: NONE SEEN
Bilirubin Urine: NEGATIVE
Glucose, UA: NEGATIVE mg/dL
Hgb urine dipstick: NEGATIVE
Ketones, ur: 5 mg/dL — AB
Nitrite: NEGATIVE
Protein, ur: NEGATIVE mg/dL
Specific Gravity, Urine: 1.018 (ref 1.005–1.030)
pH: 5 (ref 5.0–8.0)

## 2020-10-25 LAB — CBC
HCT: 41.1 % (ref 36.0–46.0)
Hemoglobin: 13.8 g/dL (ref 12.0–15.0)
MCH: 30.1 pg (ref 26.0–34.0)
MCHC: 33.6 g/dL (ref 30.0–36.0)
MCV: 89.7 fL (ref 80.0–100.0)
Platelets: 293 10*3/uL (ref 150–400)
RBC: 4.58 MIL/uL (ref 3.87–5.11)
RDW: 12.7 % (ref 11.5–15.5)
WBC: 12.8 10*3/uL — ABNORMAL HIGH (ref 4.0–10.5)
nRBC: 0 % (ref 0.0–0.2)

## 2020-10-25 LAB — COMPREHENSIVE METABOLIC PANEL
ALT: 61 U/L — ABNORMAL HIGH (ref 0–44)
AST: 38 U/L (ref 15–41)
Albumin: 3.8 g/dL (ref 3.5–5.0)
Alkaline Phosphatase: 66 U/L (ref 38–126)
Anion gap: 11 (ref 5–15)
BUN: <5 mg/dL — ABNORMAL LOW (ref 8–23)
CO2: 25 mmol/L (ref 22–32)
Calcium: 9.6 mg/dL (ref 8.9–10.3)
Chloride: 100 mmol/L (ref 98–111)
Creatinine, Ser: 0.74 mg/dL (ref 0.44–1.00)
GFR, Estimated: >60 mL/min (ref >60)
Glucose, Bld: 138 mg/dL — ABNORMAL HIGH (ref 70–99)
Potassium: 4.2 mmol/L (ref 3.5–5.1)
Sodium: 136 mmol/L (ref 135–145)
Total Bilirubin: 1 mg/dL (ref 0.3–1.2)
Total Protein: 7.3 g/dL (ref 6.5–8.1)

## 2020-10-25 LAB — TROPONIN I (HIGH SENSITIVITY): Troponin I (High Sensitivity): 3 ng/L (ref <18)

## 2020-10-25 LAB — LACTIC ACID, PLASMA: Lactic Acid, Venous: 1.2 mmol/L (ref 0.5–1.9)

## 2020-10-25 LAB — MAGNESIUM: Magnesium: 1.7 mg/dL (ref 1.7–2.4)

## 2020-10-25 LAB — LIPASE, BLOOD: Lipase: 27 U/L (ref 11–51)

## 2020-10-25 MED ORDER — ONDANSETRON HCL 4 MG/2ML IJ SOLN
4.0000 mg | Freq: Once | INTRAMUSCULAR | Status: AC
  Administered 2020-10-25: 4 mg via INTRAVENOUS
  Filled 2020-10-25: qty 2

## 2020-10-25 MED ORDER — ALUM & MAG HYDROXIDE-SIMETH 200-200-20 MG/5ML PO SUSP
30.0000 mL | Freq: Once | ORAL | Status: AC
  Administered 2020-10-25: 30 mL via ORAL
  Filled 2020-10-25: qty 30

## 2020-10-25 MED ORDER — SODIUM CHLORIDE 0.9 % IV BOLUS
1000.0000 mL | Freq: Once | INTRAVENOUS | Status: AC
  Administered 2020-10-25: 1000 mL via INTRAVENOUS

## 2020-10-25 MED ORDER — METOCLOPRAMIDE HCL 5 MG/ML IJ SOLN
5.0000 mg | Freq: Once | INTRAMUSCULAR | Status: AC
  Administered 2020-10-25: 5 mg via INTRAVENOUS
  Filled 2020-10-25: qty 2

## 2020-10-25 MED ORDER — IOHEXOL 300 MG/ML  SOLN
100.0000 mL | Freq: Once | INTRAMUSCULAR | Status: AC | PRN
  Administered 2020-10-25: 100 mL via INTRAVENOUS

## 2020-10-25 MED ORDER — DIPHENHYDRAMINE HCL 50 MG/ML IJ SOLN
25.0000 mg | Freq: Once | INTRAMUSCULAR | Status: AC
  Administered 2020-10-25: 25 mg via INTRAVENOUS
  Filled 2020-10-25: qty 1

## 2020-10-25 MED ORDER — MORPHINE SULFATE (PF) 4 MG/ML IV SOLN
4.0000 mg | Freq: Once | INTRAVENOUS | Status: AC
  Administered 2020-10-25: 4 mg via INTRAVENOUS
  Filled 2020-10-25: qty 1

## 2020-10-25 MED ORDER — IOHEXOL 350 MG/ML SOLN
52.0000 mL | Freq: Once | INTRAVENOUS | Status: AC | PRN
  Administered 2020-10-25: 52 mL via INTRAVENOUS

## 2020-10-25 MED ORDER — HYDROMORPHONE HCL 1 MG/ML IJ SOLN
0.5000 mg | Freq: Once | INTRAMUSCULAR | Status: AC
Start: 2020-10-25 — End: 2020-10-26
  Administered 2020-10-26: 0.5 mg via INTRAVENOUS
  Filled 2020-10-25 (×2): qty 1

## 2020-10-25 NOTE — ED Notes (Signed)
Patient transported to CT 

## 2020-10-25 NOTE — ED Notes (Signed)
Lt side pain x 2-3 days with nausea and vomiting and difficulty eating recent neck surgery c-collar in place

## 2020-10-25 NOTE — ED Notes (Signed)
Pt declines pain meds at this time stating she is not experiencing pain at the moment.  Pt and spouse verbalized understanding to request pain meds as needed.

## 2020-10-25 NOTE — ED Provider Notes (Signed)
Emergency Medicine Provider Triage Evaluation Note  Lauren James , a 70 y.o. female  was evaluated in triage.  Pt complains of intermittent left-sided abdominal pain with associated nausea, vomiting, and hot flashes x6 days.  Underwent neck surgery on 7/21, followed up with neurosurgery today and unremarkable follow-up from their standpoint..  Review of Systems  Positive: Abdominal pain, nausea, vomiting, fevers Negative: Chest pain or shortness of breath and palpitation  Physical Exam  BP (!) 150/63 (BP Location: Left Arm)   Pulse 88   Temp 99.2 F (37.3 C)   Resp 14   SpO2 97%  Gen:   Awake, no distress   Resp:  Normal effort  MSK:   Moves extremities without difficulty  Other:  C-collar in place from recent surgery.  Abdomen soft, nondistended, minimally tender over the left upper quadrant.  Lungs CTA B.  RRR no M/R/G.  Medical Decision Making  Medically screening exam initiated at 5:32 PM.  Appropriate orders placed.  Cher Nakai was informed that the remainder of the evaluation will be completed by another provider, this initial triage assessment does not replace that evaluation, and the importance of remaining in the ED until their evaluation is complete.   Patient declined analgesia or antiemetic at this time.  This chart was dictated using voice recognition software, Dragon. Despite the best efforts of this provider to proofread and correct errors, errors may still occur which can change documentation meaning.    Emeline Darling, PA-C 10/25/20 Wickes, [place], DO 10/25/20 2315

## 2020-10-25 NOTE — ED Notes (Signed)
Called to room by husband stating that the pt is vomiting. Provided a vomit bag and wash rags and made provider aware of same at this time

## 2020-10-25 NOTE — ED Triage Notes (Signed)
Pt reports intermittent LUQ pain radiating to the RUQ region and up into her Left arm x6 days. Started after she started vomiting on Tuesday has not vomited since. Pt had posterior neck surgery Thursday, July 21st. Last BM yesterday, passing gas.

## 2020-10-25 NOTE — ED Provider Notes (Signed)
[facility] EMERGENCY DEPARTMENT Provider Note   CSN: [id number] Arrival date & time: 10/25/20  1632     History Chief Complaint  Patient presents with   Abdominal Pain    Lauren James is a 70 y.o. female.  70 yo F with a chief complaints of left upper quadrant abdominal pain that radiates to the left shoulder and down the left arm.  This is been going on for about 6 days or so.  She has felt hot and cold off and on as well.  Has had persistent nausea and vomiting with this.  She feels like its like pleurisy.  She denies cough or congestion.  Had a C-spine surgery done prior to this starting.  Was started on pain medicine and muscle relaxants.  Pain is worse when she lies on her left side and she is unable to lie on her right side.  Has spasms off and on and when it happens has some trouble moving her left shoulder.  She had seen her spinal surgeon today who felt this was unlikely to be due to her surgery and was sent here for evaluation.  The history is provided by the patient.  Abdominal Pain Pain location:  LUQ Pain quality: aching, cramping, sharp and shooting   Pain radiates to:  L shoulder Pain severity:  Moderate Onset quality:  Gradual Duration:  1 week Timing:  Constant Progression:  Worsening Chronicity:  New Relieved by:  Nothing Worsened by:  Nothing Ineffective treatments:  None tried Associated symptoms: constipation, nausea and vomiting   Associated symptoms: no chest pain, no chills, no dysuria, no fever and no shortness of breath       Past Medical History:  Diagnosis Date   Arthritis    Atypical chest pain 04/12/2017   Back pain    Diabetes mellitus type 2 in obese ([place]) 04/12/2017   Dysuria    Frequency of urination    GERD (gastroesophageal reflux disease)    WATCHES DIET   Hematuria    History of kidney problems    History of kidney stones    Hyperlipidemia 04/12/2017   Hypertension    Nocturia    PONV (postoperative  nausea and vomiting)    Prediabetes    DIET CONTROLLED   Right ureteral stone    Urgency of urination    Vitamin D deficiency    Wears glasses     Patient Active Problem List   Diagnosis Date Noted   Insulin resistance 04/25/2018   Class 1 obesity with serious comorbidity and body mass index (BMI) of 30.0 to 30.9 in adult 02/27/2018   Atypical chest pain 04/12/2017   Diabetes mellitus type 2 in obese ([place]) 04/12/2017   Hyperlipidemia 04/12/2017    Past Surgical History:  Procedure Laterality Date   ANTERIOR CERVICAL DECOMPRESSION/DISCECTOMY FUSION 4 LEVELS N/A 10/14/2020   Procedure: ANTERIOR CERVICAL DECOMPRESSION FUSION CERVICAL 4 - CERVICAL 5, CERVICAL 5 - CERVICAL 6, CERVICAL 6- CERVICAL 7;  Surgeon: Phylliss Bob, MD;  Location: [facility];  Service: Orthopedics;  Laterality: N/A;   CHOLECYSTECTOMY  2000  (APPROX)   CYSTOSCOPY W/ URETERAL STENT PLACEMENT Right 12/13/2012   Procedure: CYSTOSCOPY/RIGHT URETERAL STENT EXCHANGE/RIGHT RETROGRADE PYELOGRAM;  Surgeon: [name], MD;  Location[facility];  Service: Urology;  Laterality: Right;   CYSTOSCOPY WITH URETEROSCOPY Right 11/27/2012   Procedure: CYSTOSCOPY WITH RIGHT  URETEROSCOPY AND STENT PLACEMENT ;  Surgeon: [name], MD;  Location: [facility];  Service:  Urology;  Laterality: Right;   EXTRACORPOREAL SHOCK WAVE LITHOTRIPSY  1994  (APPROX)   HOLMIUM LASER APPLICATION Right [id number]   Procedure: HOLMIUM LASER APPLICATION;  Surgeon: [name], MD;  Location: [facility];  Service: Urology;  Laterality: Right;   HOLMIUM LASER APPLICATION Right [id number]   Procedure: HOLMIUM LASER APPLICATION;  Surgeon: [name], MD;  Location: [facility];  Service: Urology;  Laterality: Right;   URETEROSCOPY Right 12/13/2012   Procedure: URETEROSCOPY;  Surgeon: [name], MD;  Location: [facility];  Service: Urology;  Laterality: Right;     OB History     Gravida  3   Para  3    Term      Preterm      AB      Living         SAB      IAB      Ectopic      Multiple      Live Births              Family History  Problem Relation Age of Onset   Diabetes Mother    Kidney disease Mother    Heart disease Mother    High blood pressure Mother    Stroke Mother    Obesity Mother    Dementia Father    Stroke Father    Heart attack Sister    Diabetes Sister    Diabetes Maternal Grandmother    Heart attack Maternal Grandfather    Alcoholism Paternal Grandfather    Diabetes Sister    Osteoporosis Sister     Social History   Tobacco Use   Smoking status: Never   Smokeless tobacco: Never  Vaping Use   Vaping Use: Never used  Substance Use Topics   Alcohol use: No   Drug use: No    Home Medications Prior to Admission medications   Medication Sig Start Date End Date Taking? Authorizing Provider  cholecalciferol (VITAMIN D3) 25 MCG (1000 UNIT) tablet Take 1,000 Units by mouth daily.    [provider]  Cyanocobalamin (B-12) 5000 MCG CAPS Take 5,000 mcg by mouth daily.    [provider]  glucose blood (IGLUCOSE TEST STRIPS) test strip Twice daily (use Bayer brand or whichever ins covers) 03/07/18   Jearld Lesch A, DO  HYDROcodone-acetaminophen (NORCO/VICODIN) 5-325 MG tablet Take 1 tablet by mouth every 6 (six) hours as needed for moderate pain or severe pain. 10/14/20 10/14/21  McKenzie, Lennie Muckle, PA-C  icosapent Ethyl (VASCEPA) 1 g capsule Take 2 g by mouth daily.    [provider]  losartan (COZAAR) 100 MG tablet Take 100 mg by mouth daily.    [provider]  metFORMIN (GLUCOPHAGE-XR) 500 MG 24 hr tablet Take 1,000-1,500 mg by mouth See admin instructions. Take 1000 mg by mouth in the morning, may take an additional 5 mg if needed for blood sugar of 180 or higher 07/12/20   [provider]  methocarbamol (ROBAXIN) 500 MG tablet Take 1 tablet (500 mg total) by mouth every 6 (six) hours as needed for  muscle spasms. 10/14/20   McKenzie, Lennie Muckle, PA-C  rosuvastatin (CRESTOR) 5 MG tablet Take 5 mg by mouth once a week. 03/21/17   [provider]    Allergies    Adhesive [tape]  Review of Systems   Review of Systems  Constitutional:  Negative for chills and fever.  HENT:  Negative for congestion and  rhinorrhea.   Eyes:  Negative for redness and visual disturbance.  Respiratory:  Negative for shortness of breath and wheezing.   Cardiovascular:  Negative for chest pain and palpitations.  Gastrointestinal:  Positive for abdominal pain, constipation, nausea and vomiting.  Genitourinary:  Negative for dysuria and urgency.  Musculoskeletal:  Positive for myalgias. Negative for arthralgias.  Skin:  Negative for pallor and wound.  Neurological:  Negative for dizziness and headaches.   Physical Exam Updated Vital Signs BP (!) 160/67 (BP Location: Left Arm)   Pulse 89   Temp 99.2 F (37.3 C)   Resp 18   SpO2 95%   Physical Exam Vitals and nursing note reviewed.  Constitutional:      General: She is not in acute distress.    Appearance: She is well-developed. She is not diaphoretic.  HENT:     Head: Normocephalic and atraumatic.  Eyes:     Pupils: Pupils are equal, round, and reactive to light.  Cardiovascular:     Rate and Rhythm: Normal rate and regular rhythm.     Heart sounds: No murmur heard.   No friction rub. No gallop.  Pulmonary:     Effort: Pulmonary effort is normal.     Breath sounds: No wheezing or rales.  Abdominal:     General: There is no distension.     Palpations: Abdomen is soft.     Tenderness: There is abdominal tenderness.     Comments: Tenderness diffusely about the upper abdomen but worse in the left upper quadrant.  Musculoskeletal:        General: No tenderness.     Cervical back: Normal range of motion and neck supple.     Comments: Pain to L trapezius. PMS intact distally  Skin:    General: Skin is warm and dry.  Neurological:      Mental Status: She is alert and oriented to person, place, and time.  Psychiatric:        Behavior: Behavior normal.    ED Results / Procedures / Treatments   Labs (all labs ordered are listed, but only abnormal results are displayed) Labs Reviewed  COMPREHENSIVE METABOLIC PANEL - Abnormal; Notable for the following components:      Result Value   Glucose, Bld 138 (*)    BUN <5 (*)    ALT 61 (*)    All other components within normal limits  CBC - Abnormal; Notable for the following components:   WBC 12.8 (*)    All other components within normal limits  URINALYSIS, ROUTINE W REFLEX MICROSCOPIC - Abnormal; Notable for the following components:   Ketones, ur 5 (*)    Leukocytes,Ua SMALL (*)    Non Squamous Epithelial 0-5 (*)    All other components within normal limits  LIPASE, BLOOD  MAGNESIUM  LACTIC ACID, PLASMA  LACTIC ACID, PLASMA  TROPONIN I (HIGH SENSITIVITY)    EKG EKG Interpretation  Date/Time:  Monday October 25 2020 17:13:54 EDT Ventricular Rate:  91 PR Interval:  150 QRS Duration: 66 QT Interval:  352 QTC Calculation: 432 R Axis:   40 Text Interpretation: Normal sinus rhythm background noise TECHNICALLY DIFFICULT Abnormal ECG Otherwise no significant change Confirmed by Deno Etienne [phone]) on 10/25/2020 7:00:30 PM  Radiology No results found.  Procedures Procedures   Medications Ordered in ED Medications  sodium chloride 0.9 % bolus 1,000 mL (has no administration in time range)  morphine 4 MG/ML injection 4 mg (has no administration in time range)  ondansetron (ZOFRAN) injection 4 mg (has no administration in time range)  alum & mag hydroxide-simeth (MAALOX/MYLANTA) 200-200-20 MG/5ML suspension 30 mL (has no administration in time range)    ED Course  I have reviewed the triage vital signs and the nursing notes.  Pertinent labs & imaging results that were available during my care of the patient were reviewed by me and considered in my medical decision  making (see chart for details).    MDM Rules/Calculators/A&P                           70 yo F with a chief complaints of left upper quadrant abdominal pain that radiates to the left shoulder.  Going on for about 6 days associated with nausea and vomiting.  Pain and spasm to the left trapezius on exam.  Will obtain a CT scan.  Blood work without significant abnormality.  EKG with significant background noise but no obvious signs of ischemia.  We will add a troponin.  Reassess.  Troponin negative.  Lactate normal.  UA negative for infection.  No concerning finding with her LFTs or lipase.  She does have a mild leukocytosis.  CT scan is resulted without acute intra-abdominal pathology.  She does have findings in the left lower lung.  This does make me more concerned for pulmonary embolism as she is recently postop and has oxygen saturations below 95%.  We will obtain a CT angiogram of the chest.  Patient is also continue to be significantly symptomatic.  Having persistent vomiting and breakthrough pain.  Given Reglan and Dilaudid.  Patient care signed out to Dr. Ralene Bathe, please see her note for further details.  In the ED.  The patients results and plan were reviewed and discussed.   Any x-rays performed were independently reviewed by myself.   Differential diagnosis were considered with the presenting HPI.  Medications  sodium chloride 0.9 % bolus 1,000 mL (0 mLs Intravenous Stopped 10/25/20 2221)  morphine 4 MG/ML injection 4 mg (4 mg Intravenous Given 10/25/20 2052)  ondansetron (ZOFRAN) injection 4 mg (4 mg Intravenous Given 10/25/20 2051)  alum & mag hydroxide-simeth (MAALOX/MYLANTA) 200-200-20 MG/5ML suspension 30 mL (30 mLs Oral Given 10/25/20 2054)  iohexol (OMNIPAQUE) 300 MG/ML solution 100 mL (100 mLs Intravenous Contrast Given 10/25/20 2235)  metoCLOPramide (REGLAN) injection 5 mg (5 mg Intravenous Given 10/25/20 2251)  diphenhydrAMINE (BENADRYL) injection 25 mg (25 mg Intravenous Given  10/25/20 2251)  sodium chloride 0.9 % bolus 1,000 mL (0 mLs Intravenous Stopped 10/26/20 0131)  HYDROmorphone (DILAUDID) injection 0.5 mg (0.5 mg Intravenous Given 10/26/20 0016)  iohexol (OMNIPAQUE) 350 MG/ML injection 52 mL (52 mLs Intravenous Contrast Given 10/25/20 2359)  amoxicillin-clavulanate (AUGMENTIN) 875-125 MG per tablet 1 tablet (1 tablet Oral Given 10/26/20 0137)    Vitals:   10/25/20 2251 10/26/20 0024 10/26/20 0135 10/26/20 0247  BP: (!) 166/71 (!) 165/85 (!) 148/67 (!) 148/84  Pulse: 90 86 80 81  Resp: '20 16 16 16  '$ Temp:  (!) 97.5 F (36.4 C)  (!) 97 F (36.1 C)  TempSrc:  Oral  Oral  SpO2: 91% 98% 90% 94%  Weight:      Height:        Final diagnoses:  Aspiration pneumonia of both lower lobes, unspecified aspiration pneumonia type ([place])     Final Clinical Impression(s) / ED Diagnoses Final diagnoses:  None    Rx / DC Orders ED Discharge Orders  None        Deno Etienne, DO 10/26/20 2248

## 2020-10-26 DIAGNOSIS — K449 Diaphragmatic hernia without obstruction or gangrene: Secondary | ICD-10-CM | POA: Diagnosis not present

## 2020-10-26 DIAGNOSIS — J90 Pleural effusion, not elsewhere classified: Secondary | ICD-10-CM | POA: Diagnosis not present

## 2020-10-26 DIAGNOSIS — R079 Chest pain, unspecified: Secondary | ICD-10-CM | POA: Diagnosis not present

## 2020-10-26 DIAGNOSIS — J9811 Atelectasis: Secondary | ICD-10-CM | POA: Diagnosis not present

## 2020-10-26 MED ORDER — AMOXICILLIN-POT CLAVULANATE 875-125 MG PO TABS
1.0000 | ORAL_TABLET | Freq: Once | ORAL | Status: AC
  Administered 2020-10-26: 1 via ORAL
  Filled 2020-10-26: qty 1

## 2020-10-26 MED ORDER — AMOXICILLIN-POT CLAVULANATE 875-125 MG PO TABS: 1.0000 | ORAL_TABLET | Freq: Two times a day (BID) | ORAL | 0 refills | Status: DC

## 2020-10-26 MED ORDER — ONDANSETRON 4 MG PO TBDP: 4.0000 mg | ORAL_TABLET | Freq: Three times a day (TID) | ORAL | 0 refills | Status: DC | PRN

## 2020-10-26 NOTE — ED Notes (Signed)
ED Provider at bedside. 

## 2020-10-26 NOTE — ED Notes (Signed)
Pt resting in bed, a/ox4. Pale. Mild discomfort noted on face. Pt states she has intermittent 8/10 left flank pain that increases with inspiration and movement. Denies ABD pain. +n/v x 2 days.

## 2020-10-26 NOTE — Discharge Instructions (Addendum)
You had a CT scan performed today that showed you have aspiration pneumonia affecting both of your lungs, left more so than right. It is recommended that she get a repeat x-ray in the next 3 to 4 weeks to ensure that this is improving. It is very important that you get rechecked if you have difficulty breathing or new concerning symptoms.

## 2020-10-26 NOTE — ED Provider Notes (Signed)
Pt care assumed at 0000.  Pt here for evaluation of nausea, LUQ abdominal pain following recent cervical decompression.  Care assumed pending CTA PE study.    CTA is negative for PE but concerning for aspiration pneumonia.  Pt does state that a few days ago she had trouble clearing her phlegm and that is what triggered her pain and vomiting. She did have transient hypoxia in the emergency department but this only occurred after pain medication administration. Discussed with patient findings of studies and consideration for admission given her symptoms and borderline oxygen stats. Patient prefers to be discharged home at this time in her symptoms are well controlled. Will start on antibiotics for potential aspiration pneumonia. Will discharge home with close return precautions for any progressive or worrisome symptoms.   Quintella Reichert, MD 10/26/20 0230

## 2020-10-26 NOTE — ED Notes (Signed)
Pt NAD, a/ox4. Pt and spouse verbalize understanding of all DC and f/u instructions. All questions answered. Pt walks with steady gait with spouse to lobby at [place]

## 2020-11-25 ENCOUNTER — Other Ambulatory Visit: Payer: Self-pay | Admitting: Family Medicine

## 2020-11-25 ENCOUNTER — Ambulatory Visit
Admission: RE | Admit: 2020-11-25 | Discharge: 2020-11-25 | Disposition: A | Discharge status: Home / Self Care | Payer: PPO | Source: Ambulatory Visit | Attending: Family Medicine | Admitting: Family Medicine

## 2020-11-25 DIAGNOSIS — J690 Pneumonitis due to inhalation of food and vomit: Secondary | ICD-10-CM

## 2020-11-25 DIAGNOSIS — J90 Pleural effusion, not elsewhere classified: Secondary | ICD-10-CM | POA: Diagnosis not present

## 2020-11-25 DIAGNOSIS — J181 Lobar pneumonia, unspecified organism: Secondary | ICD-10-CM | POA: Diagnosis not present

## 2020-11-26 DIAGNOSIS — J690 Pneumonitis due to inhalation of food and vomit: Secondary | ICD-10-CM | POA: Diagnosis not present

## 2020-12-03 DIAGNOSIS — Z9889 Other specified postprocedural states: Secondary | ICD-10-CM | POA: Diagnosis not present

## 2020-12-20 DIAGNOSIS — Z7984 Long term (current) use of oral hypoglycemic drugs: Secondary | ICD-10-CM | POA: Diagnosis not present

## 2020-12-20 DIAGNOSIS — G720 Drug-induced myopathy: Secondary | ICD-10-CM | POA: Diagnosis not present

## 2020-12-20 DIAGNOSIS — E785 Hyperlipidemia, unspecified: Secondary | ICD-10-CM | POA: Diagnosis not present

## 2020-12-20 DIAGNOSIS — Z23 Encounter for immunization: Secondary | ICD-10-CM | POA: Diagnosis not present

## 2020-12-20 DIAGNOSIS — H532 Diplopia: Secondary | ICD-10-CM | POA: Diagnosis not present

## 2020-12-20 DIAGNOSIS — E1169 Type 2 diabetes mellitus with other specified complication: Secondary | ICD-10-CM | POA: Diagnosis not present

## 2020-12-20 DIAGNOSIS — E559 Vitamin D deficiency, unspecified: Secondary | ICD-10-CM | POA: Diagnosis not present

## 2020-12-20 DIAGNOSIS — I10 Essential (primary) hypertension: Secondary | ICD-10-CM | POA: Diagnosis not present

## 2020-12-27 ENCOUNTER — Other Ambulatory Visit: Payer: Self-pay

## 2020-12-27 ENCOUNTER — Other Ambulatory Visit: Payer: Self-pay | Admitting: Family Medicine

## 2020-12-27 ENCOUNTER — Ambulatory Visit
Admission: RE | Admit: 2020-12-27 | Discharge: 2020-12-27 | Disposition: A | Discharge status: Home / Self Care | Payer: PPO | Source: Ambulatory Visit | Attending: Family Medicine | Admitting: Family Medicine

## 2020-12-27 DIAGNOSIS — J690 Pneumonitis due to inhalation of food and vomit: Secondary | ICD-10-CM

## 2020-12-27 DIAGNOSIS — E119 Type 2 diabetes mellitus without complications: Secondary | ICD-10-CM | POA: Diagnosis not present

## 2020-12-27 DIAGNOSIS — R1310 Dysphagia, unspecified: Secondary | ICD-10-CM | POA: Diagnosis not present

## 2020-12-27 DIAGNOSIS — H40023 Open angle with borderline findings, high risk, bilateral: Secondary | ICD-10-CM | POA: Diagnosis not present

## 2020-12-27 DIAGNOSIS — H2513 Age-related nuclear cataract, bilateral: Secondary | ICD-10-CM | POA: Diagnosis not present

## 2021-01-03 DIAGNOSIS — Z9889 Other specified postprocedural states: Secondary | ICD-10-CM | POA: Diagnosis not present

## 2021-01-17 DIAGNOSIS — M546 Pain in thoracic spine: Secondary | ICD-10-CM | POA: Diagnosis not present

## 2021-01-17 DIAGNOSIS — M542 Cervicalgia: Secondary | ICD-10-CM | POA: Diagnosis not present

## 2021-01-17 DIAGNOSIS — Z981 Arthrodesis status: Secondary | ICD-10-CM | POA: Diagnosis not present

## 2021-01-17 DIAGNOSIS — M62830 Muscle spasm of back: Secondary | ICD-10-CM | POA: Diagnosis not present

## 2021-01-19 DIAGNOSIS — M62830 Muscle spasm of back: Secondary | ICD-10-CM | POA: Diagnosis not present

## 2021-01-19 DIAGNOSIS — M546 Pain in thoracic spine: Secondary | ICD-10-CM | POA: Diagnosis not present

## 2021-01-19 DIAGNOSIS — Z981 Arthrodesis status: Secondary | ICD-10-CM | POA: Diagnosis not present

## 2021-01-19 DIAGNOSIS — M542 Cervicalgia: Secondary | ICD-10-CM | POA: Diagnosis not present

## 2021-01-24 DIAGNOSIS — M546 Pain in thoracic spine: Secondary | ICD-10-CM | POA: Diagnosis not present

## 2021-01-24 DIAGNOSIS — M542 Cervicalgia: Secondary | ICD-10-CM | POA: Diagnosis not present

## 2021-01-24 DIAGNOSIS — M62830 Muscle spasm of back: Secondary | ICD-10-CM | POA: Diagnosis not present

## 2021-01-24 DIAGNOSIS — Z981 Arthrodesis status: Secondary | ICD-10-CM | POA: Diagnosis not present

## 2021-01-27 DIAGNOSIS — H40023 Open angle with borderline findings, high risk, bilateral: Secondary | ICD-10-CM | POA: Diagnosis not present

## 2021-01-27 DIAGNOSIS — M62830 Muscle spasm of back: Secondary | ICD-10-CM | POA: Diagnosis not present

## 2021-01-27 DIAGNOSIS — M542 Cervicalgia: Secondary | ICD-10-CM | POA: Diagnosis not present

## 2021-01-27 DIAGNOSIS — Z981 Arthrodesis status: Secondary | ICD-10-CM | POA: Diagnosis not present

## 2021-01-27 DIAGNOSIS — H2513 Age-related nuclear cataract, bilateral: Secondary | ICD-10-CM | POA: Diagnosis not present

## 2021-01-27 DIAGNOSIS — E119 Type 2 diabetes mellitus without complications: Secondary | ICD-10-CM | POA: Diagnosis not present

## 2021-01-27 DIAGNOSIS — M546 Pain in thoracic spine: Secondary | ICD-10-CM | POA: Diagnosis not present

## 2021-02-02 DIAGNOSIS — Z981 Arthrodesis status: Secondary | ICD-10-CM | POA: Diagnosis not present

## 2021-02-02 DIAGNOSIS — M546 Pain in thoracic spine: Secondary | ICD-10-CM | POA: Diagnosis not present

## 2021-02-02 DIAGNOSIS — M542 Cervicalgia: Secondary | ICD-10-CM | POA: Diagnosis not present

## 2021-02-02 DIAGNOSIS — M62830 Muscle spasm of back: Secondary | ICD-10-CM | POA: Diagnosis not present

## 2021-02-09 DIAGNOSIS — M546 Pain in thoracic spine: Secondary | ICD-10-CM | POA: Diagnosis not present

## 2021-02-09 DIAGNOSIS — Z981 Arthrodesis status: Secondary | ICD-10-CM | POA: Diagnosis not present

## 2021-02-09 DIAGNOSIS — M542 Cervicalgia: Secondary | ICD-10-CM | POA: Diagnosis not present

## 2021-02-09 DIAGNOSIS — M62830 Muscle spasm of back: Secondary | ICD-10-CM | POA: Diagnosis not present

## 2021-02-16 DIAGNOSIS — Z981 Arthrodesis status: Secondary | ICD-10-CM | POA: Diagnosis not present

## 2021-02-16 DIAGNOSIS — M62830 Muscle spasm of back: Secondary | ICD-10-CM | POA: Diagnosis not present

## 2021-02-16 DIAGNOSIS — M546 Pain in thoracic spine: Secondary | ICD-10-CM | POA: Diagnosis not present

## 2021-02-16 DIAGNOSIS — M542 Cervicalgia: Secondary | ICD-10-CM | POA: Diagnosis not present

## 2021-02-24 DIAGNOSIS — R051 Acute cough: Secondary | ICD-10-CM | POA: Diagnosis not present

## 2021-02-24 DIAGNOSIS — R509 Fever, unspecified: Secondary | ICD-10-CM | POA: Diagnosis not present

## 2021-02-24 DIAGNOSIS — J111 Influenza due to unidentified influenza virus with other respiratory manifestations: Secondary | ICD-10-CM | POA: Diagnosis not present

## 2021-03-03 DIAGNOSIS — R221 Localized swelling, mass and lump, neck: Secondary | ICD-10-CM | POA: Diagnosis not present

## 2021-03-03 DIAGNOSIS — Z20828 Contact with and (suspected) exposure to other viral communicable diseases: Secondary | ICD-10-CM | POA: Diagnosis not present

## 2021-03-04 DIAGNOSIS — M542 Cervicalgia: Secondary | ICD-10-CM | POA: Diagnosis not present

## 2021-03-04 DIAGNOSIS — G959 Disease of spinal cord, unspecified: Secondary | ICD-10-CM | POA: Diagnosis not present

## 2021-03-16 DIAGNOSIS — M542 Cervicalgia: Secondary | ICD-10-CM | POA: Diagnosis not present

## 2021-03-16 DIAGNOSIS — Z981 Arthrodesis status: Secondary | ICD-10-CM | POA: Diagnosis not present

## 2021-03-16 DIAGNOSIS — M62830 Muscle spasm of back: Secondary | ICD-10-CM | POA: Diagnosis not present

## 2021-03-16 DIAGNOSIS — M546 Pain in thoracic spine: Secondary | ICD-10-CM | POA: Diagnosis not present

## 2021-03-24 DIAGNOSIS — M546 Pain in thoracic spine: Secondary | ICD-10-CM | POA: Diagnosis not present

## 2021-03-24 DIAGNOSIS — Z981 Arthrodesis status: Secondary | ICD-10-CM | POA: Diagnosis not present

## 2021-03-24 DIAGNOSIS — M542 Cervicalgia: Secondary | ICD-10-CM | POA: Diagnosis not present

## 2021-03-24 DIAGNOSIS — M62830 Muscle spasm of back: Secondary | ICD-10-CM | POA: Diagnosis not present

## 2021-03-25 DIAGNOSIS — E1169 Type 2 diabetes mellitus with other specified complication: Secondary | ICD-10-CM | POA: Diagnosis not present

## 2021-03-25 DIAGNOSIS — I10 Essential (primary) hypertension: Secondary | ICD-10-CM | POA: Diagnosis not present

## 2021-03-25 DIAGNOSIS — Z7984 Long term (current) use of oral hypoglycemic drugs: Secondary | ICD-10-CM | POA: Diagnosis not present

## 2021-03-25 DIAGNOSIS — R29818 Other symptoms and signs involving the nervous system: Secondary | ICD-10-CM | POA: Diagnosis not present

## 2021-03-25 DIAGNOSIS — R1319 Other dysphagia: Secondary | ICD-10-CM | POA: Diagnosis not present

## 2021-03-29 DIAGNOSIS — M62830 Muscle spasm of back: Secondary | ICD-10-CM | POA: Diagnosis not present

## 2021-03-29 DIAGNOSIS — M546 Pain in thoracic spine: Secondary | ICD-10-CM | POA: Diagnosis not present

## 2021-03-29 DIAGNOSIS — M542 Cervicalgia: Secondary | ICD-10-CM | POA: Diagnosis not present

## 2021-03-29 DIAGNOSIS — Z981 Arthrodesis status: Secondary | ICD-10-CM | POA: Diagnosis not present

## 2021-04-05 DIAGNOSIS — H40023 Open angle with borderline findings, high risk, bilateral: Secondary | ICD-10-CM | POA: Diagnosis not present

## 2021-04-05 DIAGNOSIS — M542 Cervicalgia: Secondary | ICD-10-CM | POA: Diagnosis not present

## 2021-04-05 DIAGNOSIS — M62830 Muscle spasm of back: Secondary | ICD-10-CM | POA: Diagnosis not present

## 2021-04-05 DIAGNOSIS — M546 Pain in thoracic spine: Secondary | ICD-10-CM | POA: Diagnosis not present

## 2021-04-05 DIAGNOSIS — Z981 Arthrodesis status: Secondary | ICD-10-CM | POA: Diagnosis not present

## 2021-04-20 DIAGNOSIS — M62830 Muscle spasm of back: Secondary | ICD-10-CM | POA: Diagnosis not present

## 2021-04-20 DIAGNOSIS — M542 Cervicalgia: Secondary | ICD-10-CM | POA: Diagnosis not present

## 2021-04-20 DIAGNOSIS — M546 Pain in thoracic spine: Secondary | ICD-10-CM | POA: Diagnosis not present

## 2021-04-20 DIAGNOSIS — Z981 Arthrodesis status: Secondary | ICD-10-CM | POA: Diagnosis not present

## 2021-04-27 DIAGNOSIS — M62830 Muscle spasm of back: Secondary | ICD-10-CM | POA: Diagnosis not present

## 2021-04-27 DIAGNOSIS — M546 Pain in thoracic spine: Secondary | ICD-10-CM | POA: Diagnosis not present

## 2021-04-27 DIAGNOSIS — M542 Cervicalgia: Secondary | ICD-10-CM | POA: Diagnosis not present

## 2021-04-27 DIAGNOSIS — Z981 Arthrodesis status: Secondary | ICD-10-CM | POA: Diagnosis not present

## 2021-04-28 ENCOUNTER — Ambulatory Visit: Payer: PPO | Admitting: Neurology

## 2021-04-28 ENCOUNTER — Telehealth: Payer: Self-pay | Admitting: Neurology

## 2021-04-28 ENCOUNTER — Encounter: Payer: Self-pay | Admitting: Neurology

## 2021-04-28 VITALS — BP 143/76 | HR 69 | Ht 64.0 in | Wt 162.0 lb

## 2021-04-28 DIAGNOSIS — R42 Dizziness and giddiness: Secondary | ICD-10-CM | POA: Diagnosis not present

## 2021-04-28 MED ORDER — MECLIZINE HCL 12.5 MG PO TABS: 12.5000 mg | ORAL_TABLET | Freq: Three times a day (TID) | ORAL | 0 refills | Status: DC | PRN

## 2021-04-28 NOTE — Patient Instructions (Signed)
Trial of Meclizine  CT Head with and without contrast to rule out causes of dizziness  Follow up with your PCP and return if worse

## 2021-04-28 NOTE — Telephone Encounter (Signed)
Health team order sent to GI, [facility] they will reach out to the patient to schedule.

## 2021-04-28 NOTE — Progress Notes (Signed)
[facility]  PATIENT: Lauren James DOB: 1950-06-22  REQUESTING CLINICIAN: Donald Prose, MD HISTORY FROM: Patient  REASON FOR VISIT: Dizziness    HISTORICAL  CHIEF COMPLAINT:  Chief Complaint  Patient presents with   New Patient (Initial Visit)    NP/paper proficient/Cynthia Lorie Phenix at [facility] [phone] balance issues, falling States there are time where she feels light headed and dizzy which causes balance issues,     HISTORY OF PRESENT ILLNESS:  This is a 71 year old woman with past medical history of diabetes, hypertension and hearing loss who is presenting for balance issue.  Patient reports she has been having balance for the past year but worse in the past 3 to 4 months.  She describes a balance issue as stumbling when waking up in the morning, and feeling like she is rocking side to side.  Because of this balance issue, she had fell a couple times, denies any injury.  She did have neck surgery, ACDF back in July 2022.-She has follow-up with the surgeon and was told that the dizziness is not related to the neck surgery.  She is currently getting physical therapy due to ongoing not neck pain and deconditioning.  Patient does have a history of hearing loss and tinnitus, currently she wears hearing aid.  She denies any room spinning sensation, denies any nausea or vomiting with the episodes.  She has not tried any medications for the balance problems.    OTHER MEDICAL CONDITIONS: Hearing loss, Diabetes and Hypertension    REVIEW OF SYSTEMS: Full 14 system review of systems performed and negative with exception of: As noted in the HPI   ALLERGIES: Allergies  Allergen Reactions   Adhesive [Tape] Itching and Other (See Comments)    BLISTERS, if worn for a long period of time    HOME MEDICATIONS: Outpatient Medications Prior to Visit  Medication Sig Dispense Refill   cholecalciferol (VITAMIN D3) 25 MCG (1000 UNIT) tablet Take 1,000 Units by mouth  daily.     glucose blood (IGLUCOSE TEST STRIPS) test strip Twice daily (use Bayer brand or whichever ins covers) 100 each 0   JARDIANCE 10 MG TABS tablet Take 10 mg by mouth daily.     latanoprost (XALATAN) 0.005 % ophthalmic solution 1 drop at bedtime.     metFORMIN (GLUCOPHAGE-XR) 500 MG 24 hr tablet Take 1,000-1,500 mg by mouth See admin instructions. Take 1000 mg by mouth in the morning, may take an additional 5 mg if needed for blood sugar of 180 or higher     valsartan (DIOVAN) 320 MG tablet      amoxicillin-clavulanate (AUGMENTIN) 875-125 MG tablet Take 1 tablet by mouth every 12 (twelve) hours. 14 tablet 0   Cyanocobalamin (B-12) 5000 MCG CAPS Take 5,000 mcg by mouth daily.     HYDROcodone-acetaminophen (NORCO/VICODIN) 5-325 MG tablet Take 1 tablet by mouth every 6 (six) hours as needed for moderate pain or severe pain. 20 tablet 0   icosapent Ethyl (VASCEPA) 1 g capsule Take 2 g by mouth daily.     losartan (COZAAR) 100 MG tablet Take 100 mg by mouth daily.     methocarbamol (ROBAXIN) 500 MG tablet Take 1 tablet (500 mg total) by mouth every 6 (six) hours as needed for muscle spasms. 60 tablet 0   ondansetron (ZOFRAN ODT) 4 MG disintegrating tablet Take 1 tablet (4 mg total) by mouth every 8 (eight) hours as needed for nausea or vomiting. 12 tablet 0   rosuvastatin (CRESTOR) 5  MG tablet Take 5 mg by mouth once a week.  1   No facility-administered medications prior to visit.    PAST MEDICAL HISTORY: Past Medical History:  Diagnosis Date   Arthritis    Atypical chest pain 04/12/2017   Back pain    Diabetes mellitus type 2 in obese ([place]) 04/12/2017   Dysuria    Frequency of urination    GERD (gastroesophageal reflux disease)    WATCHES DIET   Hematuria    History of kidney problems    History of kidney stones    Hyperlipidemia 04/12/2017   Hypertension    Nocturia    PONV (postoperative nausea and vomiting)    Prediabetes    DIET CONTROLLED   Right ureteral stone     Urgency of urination    Vitamin D deficiency    Wears glasses     PAST SURGICAL HISTORY: Past Surgical History:  Procedure Laterality Date   ANTERIOR CERVICAL DECOMPRESSION/DISCECTOMY FUSION 4 LEVELS N/A 10/14/2020   Procedure: ANTERIOR CERVICAL DECOMPRESSION FUSION CERVICAL 4 - CERVICAL 5, CERVICAL 5 - CERVICAL 6, CERVICAL 6- CERVICAL 7;  Surgeon: Phylliss Bob, MD;  Location: [place];  Service: Orthopedics;  Laterality: N/A;   CHOLECYSTECTOMY  2000  (APPROX)   CYSTOSCOPY W/ URETERAL STENT PLACEMENT Right 12/13/2012   Procedure: CYSTOSCOPY/RIGHT URETERAL STENT EXCHANGE/RIGHT RETROGRADE PYELOGRAM;  Surgeon: [name], MD;  Location: [facility];  Service: Urology;  Laterality: Right;   CYSTOSCOPY WITH URETEROSCOPY Right 11/27/2012   Procedure: CYSTOSCOPY WITH RIGHT  URETEROSCOPY AND STENT PLACEMENT ;  Surgeon: [name], MD;  Location: [facility];  Service: Urology;  Laterality: Right;   EXTRACORPOREAL SHOCK WAVE LITHOTRIPSY  1994  (APPROX)   HOLMIUM LASER APPLICATION Right 09/27/2593   Procedure: HOLMIUM LASER APPLICATION;  Surgeon: [name], MD;  Location: [facility];  Service: Urology;  Laterality: Right;   HOLMIUM LASER APPLICATION Right 6/38/7564   Procedure: HOLMIUM LASER APPLICATION;  Surgeon: [name], MD;  Location[facility];  Service: Urology;  Laterality: Right;   URETEROSCOPY Right 12/13/2012   Procedure: URETEROSCOPY;  Surgeon: [name], MD;  Location: [facility];  Service: Urology;  Laterality: Right;    FAMILY HISTORY: Family History  Problem Relation Age of Onset   Diabetes Mother    Kidney disease Mother    Heart disease Mother    High blood pressure Mother    Stroke Mother    Obesity Mother    Dementia Father    Stroke Father    Heart attack Sister    Diabetes Sister    Diabetes Maternal Grandmother    Heart attack Maternal Grandfather    Alcoholism Paternal Grandfather    Diabetes Sister     Osteoporosis Sister     SOCIAL HISTORY: Social History   Socioeconomic History   Marital status: Married    Spouse name: Chrissie Noa   Number of children: Not on file   Years of education: Not on file   Highest education level: Not on file  Occupational History   Occupation: Press photographer, Retired  Tobacco Use   Smoking status: Never   Smokeless tobacco: Never  Vaping Use   Vaping Use: Never used  Substance and Sexual Activity   Alcohol use: No   Drug use: No   Sexual activity: Not on file  Other Topics Concern   Not on file  Social History Narrative   Not on file   Social Determinants of Health  Financial Resource Strain: Not on file  Food Insecurity: Not on file  Transportation Needs: Not on file  Physical Activity: Not on file  Stress: Not on file  Social Connections: Not on file  Intimate Partner Violence: Not on file    PHYSICAL EXAM  GENERAL EXAM/CONSTITUTIONAL: Vitals:  Vitals:   04/28/21 0943  BP: (!) 143/76  Pulse: 69  Weight: 162 lb (73.5 kg)  Height: 5\' 4"  (1.626 m)   Body mass index is 27.81 kg/m. Wt Readings from Last 3 Encounters:  04/28/21 162 lb (73.5 kg)  10/25/20 165 lb (74.8 kg)  10/14/20 169 lb 4.8 oz (76.8 kg)   Patient is in no distress; well developed, nourished and groomed; neck is supple  CARDIOVASCULAR: Examination of carotid arteries is normal; no carotid bruits Regular rate and rhythm, no murmurs Examination of peripheral vascular system by observation and palpation is normal  EYES: Pupils round and reactive to light, Visual fields full to confrontation, Extraocular movements intacts,   MUSCULOSKELETAL: Gait, strength, tone, movements noted in Neurologic exam below  NEUROLOGIC: MENTAL STATUS:  No flowsheet data found. awake, alert, oriented to person, place and time recent and remote memory intact normal attention and concentration language fluent, comprehension intact, naming intact fund of knowledge  appropriate  CRANIAL NERVE:  2nd, 3rd, 4th, 6th - pupils equal and reactive to light, visual fields full to confrontation, extraocular muscles intact, no nystagmus 5th - facial sensation symmetric 7th - facial strength symmetric 8th - hearing intact 9th - palate elevates symmetrically, uvula midline 11th - shoulder shrug symmetric 12th - tongue protrusion midline  MOTOR:  normal bulk and tone, full strength in the BUE, BLE  SENSORY:  normal and symmetric to light touch, pinprick, temperature, vibration  COORDINATION:  finger-nose-finger, fine finger movements normal  REFLEXES:  deep tendon reflexes present and symmetric  GAIT/STATION:  normal     DIAGNOSTIC DATA (LABS, IMAGING, TESTING) - I reviewed patient records, labs, notes, testing and imaging myself where available.  Lab Results  Component Value Date   WBC 12.8 (H) 10/25/2020   HGB 13.8 10/25/2020   HCT 41.1 10/25/2020   MCV 89.7 10/25/2020   PLT 293 10/25/2020      Component Value Date/Time   NA 136 10/25/2020 1717   NA 141 04/24/2018 1340   K 4.2 10/25/2020 1717   CL 100 10/25/2020 1717   CO2 25 10/25/2020 1717   GLUCOSE 138 (H) 10/25/2020 1717   BUN <5 (L) 10/25/2020 1717   BUN 13 04/24/2018 1340   CREATININE 0.74 10/25/2020 1717   CALCIUM 9.6 10/25/2020 1717   PROT 7.3 10/25/2020 1717   PROT 7.0 04/24/2018 1340   ALBUMIN 3.8 10/25/2020 1717   ALBUMIN 4.5 04/24/2018 1340   AST 38 10/25/2020 1717   ALT 61 (H) 10/25/2020 1717   ALKPHOS 66 10/25/2020 1717   BILITOT 1.0 10/25/2020 1717   BILITOT 0.4 04/24/2018 1340   GFRNONAA >60 10/25/2020 1717   GFRAA >60 02/11/2019 1219   Lab Results  Component Value Date   CHOL 109 05/13/2018   HDL 41 05/13/2018   LDLCALC 36 05/13/2018   TRIG 161 (H) 05/13/2018   Lab Results  Component Value Date   HGBA1C 6.5 (H) 10/11/2020   No results found for: VITAMINB12 No results found for: TSH    ASSESSMENT AND PLAN  71 y.o. year old female with  hypertension and diabetes who is presenting with a year history of balance problem worse in the past 4 months.  Balance problem described as being unsteady and stumbling in the morning or sometime rocking from side to side.  She denies any room spinning sensation.  She did have cervical spine surgery, ACDF and was told everything was normal, that the surgery is not related to her balance problem.  Her neurological exam is nonfocal, no dysmetria, I will try her on meclizine and also obtain a CT head to rule out any intracranial pathology that can explain the dizziness.  She also appears appear deconditioned.  She is getting physical therapy, I encouraged her to continue physical therapy and also do the same exercises at home.  I will contact the patient to go over the result and if normal she can just follow-up with her primary care doctor and return if worse.   1. Dizziness      Patient Instructions  Trial of Meclizine  CT Head with and without contrast to rule out causes of dizziness  Follow up with your PCP and return if worse   Orders Placed This Encounter  Procedures   CT HEAD W & WO CONTRAST (5MM)    Meds ordered this encounter  Medications   meclizine (ANTIVERT) 12.5 MG tablet    Sig: Take 1 tablet (12.5 mg total) by mouth 3 (three) times daily as needed for dizziness.    Dispense:  21 tablet    Refill:  0    Return if symptoms worsen or fail to improve.    Alric Ran, MD 04/28/2021, 10:28 AM  [facility] [address], [place], [place] [address] [phone]

## 2021-05-04 DIAGNOSIS — Z981 Arthrodesis status: Secondary | ICD-10-CM | POA: Diagnosis not present

## 2021-05-04 DIAGNOSIS — M62830 Muscle spasm of back: Secondary | ICD-10-CM | POA: Diagnosis not present

## 2021-05-04 DIAGNOSIS — M542 Cervicalgia: Secondary | ICD-10-CM | POA: Diagnosis not present

## 2021-05-04 DIAGNOSIS — M546 Pain in thoracic spine: Secondary | ICD-10-CM | POA: Diagnosis not present

## 2021-05-05 DIAGNOSIS — H40023 Open angle with borderline findings, high risk, bilateral: Secondary | ICD-10-CM | POA: Diagnosis not present

## 2021-05-09 DIAGNOSIS — Z981 Arthrodesis status: Secondary | ICD-10-CM | POA: Diagnosis not present

## 2021-05-09 DIAGNOSIS — M546 Pain in thoracic spine: Secondary | ICD-10-CM | POA: Diagnosis not present

## 2021-05-09 DIAGNOSIS — M542 Cervicalgia: Secondary | ICD-10-CM | POA: Diagnosis not present

## 2021-05-09 DIAGNOSIS — M62830 Muscle spasm of back: Secondary | ICD-10-CM | POA: Diagnosis not present

## 2021-05-11 DIAGNOSIS — Z981 Arthrodesis status: Secondary | ICD-10-CM | POA: Diagnosis not present

## 2021-05-11 DIAGNOSIS — M546 Pain in thoracic spine: Secondary | ICD-10-CM | POA: Diagnosis not present

## 2021-05-11 DIAGNOSIS — M542 Cervicalgia: Secondary | ICD-10-CM | POA: Diagnosis not present

## 2021-05-11 DIAGNOSIS — M62830 Muscle spasm of back: Secondary | ICD-10-CM | POA: Diagnosis not present

## 2021-05-16 DIAGNOSIS — Z981 Arthrodesis status: Secondary | ICD-10-CM | POA: Diagnosis not present

## 2021-05-16 DIAGNOSIS — M546 Pain in thoracic spine: Secondary | ICD-10-CM | POA: Diagnosis not present

## 2021-05-16 DIAGNOSIS — M62830 Muscle spasm of back: Secondary | ICD-10-CM | POA: Diagnosis not present

## 2021-05-16 DIAGNOSIS — M542 Cervicalgia: Secondary | ICD-10-CM | POA: Diagnosis not present

## 2021-05-18 DIAGNOSIS — M546 Pain in thoracic spine: Secondary | ICD-10-CM | POA: Diagnosis not present

## 2021-05-18 DIAGNOSIS — M542 Cervicalgia: Secondary | ICD-10-CM | POA: Diagnosis not present

## 2021-05-18 DIAGNOSIS — Z981 Arthrodesis status: Secondary | ICD-10-CM | POA: Diagnosis not present

## 2021-05-18 DIAGNOSIS — M62830 Muscle spasm of back: Secondary | ICD-10-CM | POA: Diagnosis not present

## 2021-05-23 ENCOUNTER — Ambulatory Visit
Admission: RE | Admit: 2021-05-23 | Discharge: 2021-05-23 | Disposition: A | Discharge status: Home / Self Care | Payer: PPO | Source: Ambulatory Visit | Attending: Neurology | Admitting: Neurology

## 2021-05-23 ENCOUNTER — Other Ambulatory Visit: Payer: Self-pay

## 2021-05-23 DIAGNOSIS — M546 Pain in thoracic spine: Secondary | ICD-10-CM | POA: Diagnosis not present

## 2021-05-23 DIAGNOSIS — R269 Unspecified abnormalities of gait and mobility: Secondary | ICD-10-CM | POA: Diagnosis not present

## 2021-05-23 DIAGNOSIS — M62830 Muscle spasm of back: Secondary | ICD-10-CM | POA: Diagnosis not present

## 2021-05-23 DIAGNOSIS — M542 Cervicalgia: Secondary | ICD-10-CM | POA: Diagnosis not present

## 2021-05-23 DIAGNOSIS — D1779 Benign lipomatous neoplasm of other sites: Secondary | ICD-10-CM | POA: Diagnosis not present

## 2021-05-23 DIAGNOSIS — Z981 Arthrodesis status: Secondary | ICD-10-CM | POA: Diagnosis not present

## 2021-05-23 DIAGNOSIS — D170 Benign lipomatous neoplasm of skin and subcutaneous tissue of head, face and neck: Secondary | ICD-10-CM | POA: Diagnosis not present

## 2021-05-23 DIAGNOSIS — R42 Dizziness and giddiness: Secondary | ICD-10-CM

## 2021-05-23 MED ORDER — IOPAMIDOL (ISOVUE-300) INJECTION 61%
75.0000 mL | Freq: Once | INTRAVENOUS | Status: AC | PRN
  Administered 2021-05-23: 75 mL via INTRAVENOUS

## 2021-05-25 DIAGNOSIS — M546 Pain in thoracic spine: Secondary | ICD-10-CM | POA: Diagnosis not present

## 2021-05-25 DIAGNOSIS — M542 Cervicalgia: Secondary | ICD-10-CM | POA: Diagnosis not present

## 2021-05-25 DIAGNOSIS — M62830 Muscle spasm of back: Secondary | ICD-10-CM | POA: Diagnosis not present

## 2021-05-25 DIAGNOSIS — Z981 Arthrodesis status: Secondary | ICD-10-CM | POA: Diagnosis not present

## 2021-06-02 DIAGNOSIS — M62830 Muscle spasm of back: Secondary | ICD-10-CM | POA: Diagnosis not present

## 2021-06-02 DIAGNOSIS — M542 Cervicalgia: Secondary | ICD-10-CM | POA: Diagnosis not present

## 2021-06-02 DIAGNOSIS — M546 Pain in thoracic spine: Secondary | ICD-10-CM | POA: Diagnosis not present

## 2021-06-02 DIAGNOSIS — Z981 Arthrodesis status: Secondary | ICD-10-CM | POA: Diagnosis not present

## 2021-06-08 DIAGNOSIS — M546 Pain in thoracic spine: Secondary | ICD-10-CM | POA: Diagnosis not present

## 2021-06-08 DIAGNOSIS — Z981 Arthrodesis status: Secondary | ICD-10-CM | POA: Diagnosis not present

## 2021-06-08 DIAGNOSIS — M542 Cervicalgia: Secondary | ICD-10-CM | POA: Diagnosis not present

## 2021-06-08 DIAGNOSIS — M62830 Muscle spasm of back: Secondary | ICD-10-CM | POA: Diagnosis not present

## 2021-06-22 DIAGNOSIS — M62830 Muscle spasm of back: Secondary | ICD-10-CM | POA: Diagnosis not present

## 2021-06-22 DIAGNOSIS — M542 Cervicalgia: Secondary | ICD-10-CM | POA: Diagnosis not present

## 2021-06-22 DIAGNOSIS — Z981 Arthrodesis status: Secondary | ICD-10-CM | POA: Diagnosis not present

## 2021-06-22 DIAGNOSIS — M546 Pain in thoracic spine: Secondary | ICD-10-CM | POA: Diagnosis not present

## 2021-06-23 DIAGNOSIS — E2839 Other primary ovarian failure: Secondary | ICD-10-CM | POA: Diagnosis not present

## 2021-06-23 DIAGNOSIS — E785 Hyperlipidemia, unspecified: Secondary | ICD-10-CM | POA: Diagnosis not present

## 2021-06-23 DIAGNOSIS — E663 Overweight: Secondary | ICD-10-CM | POA: Diagnosis not present

## 2021-06-23 DIAGNOSIS — E559 Vitamin D deficiency, unspecified: Secondary | ICD-10-CM | POA: Diagnosis not present

## 2021-06-23 DIAGNOSIS — Z86010 Personal history of colonic polyps: Secondary | ICD-10-CM | POA: Diagnosis not present

## 2021-06-23 DIAGNOSIS — Z23 Encounter for immunization: Secondary | ICD-10-CM | POA: Diagnosis not present

## 2021-06-23 DIAGNOSIS — Z0000 Encounter for general adult medical examination without abnormal findings: Secondary | ICD-10-CM | POA: Diagnosis not present

## 2021-06-23 DIAGNOSIS — Z79899 Other long term (current) drug therapy: Secondary | ICD-10-CM | POA: Diagnosis not present

## 2021-06-23 DIAGNOSIS — I10 Essential (primary) hypertension: Secondary | ICD-10-CM | POA: Diagnosis not present

## 2021-06-23 DIAGNOSIS — E1169 Type 2 diabetes mellitus with other specified complication: Secondary | ICD-10-CM | POA: Diagnosis not present

## 2021-06-23 DIAGNOSIS — R1314 Dysphagia, pharyngoesophageal phase: Secondary | ICD-10-CM | POA: Diagnosis not present

## 2021-06-27 ENCOUNTER — Other Ambulatory Visit: Payer: Self-pay | Admitting: Family Medicine

## 2021-06-27 DIAGNOSIS — E2839 Other primary ovarian failure: Secondary | ICD-10-CM

## 2021-06-29 DIAGNOSIS — M542 Cervicalgia: Secondary | ICD-10-CM | POA: Diagnosis not present

## 2021-06-29 DIAGNOSIS — M546 Pain in thoracic spine: Secondary | ICD-10-CM | POA: Diagnosis not present

## 2021-06-29 DIAGNOSIS — M62830 Muscle spasm of back: Secondary | ICD-10-CM | POA: Diagnosis not present

## 2021-06-29 DIAGNOSIS — Z981 Arthrodesis status: Secondary | ICD-10-CM | POA: Diagnosis not present

## 2021-07-06 DIAGNOSIS — M542 Cervicalgia: Secondary | ICD-10-CM | POA: Diagnosis not present

## 2021-07-06 DIAGNOSIS — M546 Pain in thoracic spine: Secondary | ICD-10-CM | POA: Diagnosis not present

## 2021-07-06 DIAGNOSIS — M62830 Muscle spasm of back: Secondary | ICD-10-CM | POA: Diagnosis not present

## 2021-07-06 DIAGNOSIS — Z981 Arthrodesis status: Secondary | ICD-10-CM | POA: Diagnosis not present

## 2021-07-14 ENCOUNTER — Other Ambulatory Visit: Payer: Self-pay | Admitting: Family Medicine

## 2021-07-14 DIAGNOSIS — Z1231 Encounter for screening mammogram for malignant neoplasm of breast: Secondary | ICD-10-CM

## 2021-07-21 ENCOUNTER — Ambulatory Visit
Admission: RE | Admit: 2021-07-21 | Discharge: 2021-07-21 | Disposition: A | Discharge status: Home / Self Care | Payer: PPO | Source: Ambulatory Visit | Attending: Family Medicine | Admitting: Family Medicine

## 2021-07-21 DIAGNOSIS — E2839 Other primary ovarian failure: Secondary | ICD-10-CM

## 2021-07-21 DIAGNOSIS — Z780 Asymptomatic menopausal state: Secondary | ICD-10-CM | POA: Diagnosis not present

## 2021-07-21 DIAGNOSIS — M8589 Other specified disorders of bone density and structure, multiple sites: Secondary | ICD-10-CM | POA: Diagnosis not present

## 2021-07-29 ENCOUNTER — Ambulatory Visit: Payer: PPO

## 2021-08-01 DIAGNOSIS — K760 Fatty (change of) liver, not elsewhere classified: Secondary | ICD-10-CM | POA: Diagnosis not present

## 2021-08-01 DIAGNOSIS — E1165 Type 2 diabetes mellitus with hyperglycemia: Secondary | ICD-10-CM | POA: Diagnosis not present

## 2021-08-01 DIAGNOSIS — Z1331 Encounter for screening for depression: Secondary | ICD-10-CM | POA: Diagnosis not present

## 2021-08-01 DIAGNOSIS — E559 Vitamin D deficiency, unspecified: Secondary | ICD-10-CM | POA: Diagnosis not present

## 2021-08-01 DIAGNOSIS — R1310 Dysphagia, unspecified: Secondary | ICD-10-CM | POA: Diagnosis not present

## 2021-08-01 DIAGNOSIS — E1169 Type 2 diabetes mellitus with other specified complication: Secondary | ICD-10-CM | POA: Diagnosis not present

## 2021-08-01 DIAGNOSIS — R5383 Other fatigue: Secondary | ICD-10-CM | POA: Diagnosis not present

## 2021-08-01 DIAGNOSIS — R0602 Shortness of breath: Secondary | ICD-10-CM | POA: Diagnosis not present

## 2021-08-01 DIAGNOSIS — M549 Dorsalgia, unspecified: Secondary | ICD-10-CM | POA: Diagnosis not present

## 2021-08-01 DIAGNOSIS — F3289 Other specified depressive episodes: Secondary | ICD-10-CM | POA: Diagnosis not present

## 2021-08-01 DIAGNOSIS — E663 Overweight: Secondary | ICD-10-CM | POA: Diagnosis not present

## 2021-08-01 DIAGNOSIS — E1159 Type 2 diabetes mellitus with other circulatory complications: Secondary | ICD-10-CM | POA: Diagnosis not present

## 2021-08-04 DIAGNOSIS — Z79899 Other long term (current) drug therapy: Secondary | ICD-10-CM | POA: Diagnosis not present

## 2021-08-04 DIAGNOSIS — E785 Hyperlipidemia, unspecified: Secondary | ICD-10-CM | POA: Diagnosis not present

## 2021-08-15 DIAGNOSIS — E559 Vitamin D deficiency, unspecified: Secondary | ICD-10-CM | POA: Diagnosis not present

## 2021-08-15 DIAGNOSIS — E781 Pure hyperglyceridemia: Secondary | ICD-10-CM | POA: Diagnosis not present

## 2021-08-15 DIAGNOSIS — E1169 Type 2 diabetes mellitus with other specified complication: Secondary | ICD-10-CM | POA: Diagnosis not present

## 2021-08-15 DIAGNOSIS — Z6827 Body mass index (BMI) 27.0-27.9, adult: Secondary | ICD-10-CM | POA: Diagnosis not present

## 2021-08-17 ENCOUNTER — Other Ambulatory Visit: Payer: Self-pay | Admitting: Physician Assistant

## 2021-08-17 DIAGNOSIS — F458 Other somatoform disorders: Secondary | ICD-10-CM | POA: Diagnosis not present

## 2021-08-17 DIAGNOSIS — R1310 Dysphagia, unspecified: Secondary | ICD-10-CM

## 2021-08-17 DIAGNOSIS — K219 Gastro-esophageal reflux disease without esophagitis: Secondary | ICD-10-CM | POA: Diagnosis not present

## 2021-08-17 DIAGNOSIS — R1314 Dysphagia, pharyngoesophageal phase: Secondary | ICD-10-CM | POA: Diagnosis not present

## 2021-08-17 DIAGNOSIS — Z9889 Other specified postprocedural states: Secondary | ICD-10-CM | POA: Diagnosis not present

## 2021-08-17 DIAGNOSIS — Z86010 Personal history of colonic polyps: Secondary | ICD-10-CM | POA: Diagnosis not present

## 2021-08-19 ENCOUNTER — Ambulatory Visit
Admission: RE | Admit: 2021-08-19 | Discharge: 2021-08-19 | Disposition: A | Discharge status: Home / Self Care | Payer: PPO | Source: Ambulatory Visit | Attending: Physician Assistant | Admitting: Physician Assistant

## 2021-08-19 DIAGNOSIS — R1310 Dysphagia, unspecified: Secondary | ICD-10-CM

## 2021-08-19 DIAGNOSIS — K449 Diaphragmatic hernia without obstruction or gangrene: Secondary | ICD-10-CM | POA: Diagnosis not present

## 2021-08-24 ENCOUNTER — Ambulatory Visit
Admission: RE | Admit: 2021-08-24 | Discharge: 2021-08-24 | Disposition: A | Discharge status: Home / Self Care | Payer: PPO | Source: Ambulatory Visit | Attending: Family Medicine | Admitting: Family Medicine

## 2021-08-24 DIAGNOSIS — Z1231 Encounter for screening mammogram for malignant neoplasm of breast: Secondary | ICD-10-CM

## 2021-09-06 DIAGNOSIS — K649 Unspecified hemorrhoids: Secondary | ICD-10-CM | POA: Diagnosis not present

## 2021-09-06 DIAGNOSIS — K219 Gastro-esophageal reflux disease without esophagitis: Secondary | ICD-10-CM | POA: Diagnosis not present

## 2021-09-06 DIAGNOSIS — Z86010 Personal history of colonic polyps: Secondary | ICD-10-CM | POA: Diagnosis not present

## 2021-09-06 DIAGNOSIS — K5730 Diverticulosis of large intestine without perforation or abscess without bleeding: Secondary | ICD-10-CM | POA: Diagnosis not present

## 2021-09-06 DIAGNOSIS — K635 Polyp of colon: Secondary | ICD-10-CM | POA: Diagnosis not present

## 2021-09-06 DIAGNOSIS — K449 Diaphragmatic hernia without obstruction or gangrene: Secondary | ICD-10-CM | POA: Diagnosis not present

## 2021-09-06 DIAGNOSIS — R933 Abnormal findings on diagnostic imaging of other parts of digestive tract: Secondary | ICD-10-CM | POA: Diagnosis not present

## 2021-09-08 DIAGNOSIS — K219 Gastro-esophageal reflux disease without esophagitis: Secondary | ICD-10-CM | POA: Diagnosis not present

## 2021-09-08 DIAGNOSIS — K635 Polyp of colon: Secondary | ICD-10-CM | POA: Diagnosis not present

## 2021-09-13 DIAGNOSIS — E118 Type 2 diabetes mellitus with unspecified complications: Secondary | ICD-10-CM | POA: Diagnosis not present

## 2021-09-13 DIAGNOSIS — E663 Overweight: Secondary | ICD-10-CM | POA: Diagnosis not present

## 2021-09-13 DIAGNOSIS — M546 Pain in thoracic spine: Secondary | ICD-10-CM | POA: Diagnosis not present

## 2021-09-13 DIAGNOSIS — R632 Polyphagia: Secondary | ICD-10-CM | POA: Diagnosis not present

## 2021-09-28 DIAGNOSIS — R632 Polyphagia: Secondary | ICD-10-CM | POA: Diagnosis not present

## 2021-09-28 DIAGNOSIS — K760 Fatty (change of) liver, not elsewhere classified: Secondary | ICD-10-CM | POA: Diagnosis not present

## 2021-09-28 DIAGNOSIS — F3289 Other specified depressive episodes: Secondary | ICD-10-CM | POA: Diagnosis not present

## 2021-09-28 DIAGNOSIS — E785 Hyperlipidemia, unspecified: Secondary | ICD-10-CM | POA: Diagnosis not present

## 2021-09-28 DIAGNOSIS — M79671 Pain in right foot: Secondary | ICD-10-CM | POA: Diagnosis not present

## 2021-09-28 DIAGNOSIS — E118 Type 2 diabetes mellitus with unspecified complications: Secondary | ICD-10-CM | POA: Diagnosis not present

## 2021-09-28 DIAGNOSIS — M79606 Pain in leg, unspecified: Secondary | ICD-10-CM | POA: Diagnosis not present

## 2021-09-28 DIAGNOSIS — I10 Essential (primary) hypertension: Secondary | ICD-10-CM | POA: Diagnosis not present

## 2021-09-28 DIAGNOSIS — E663 Overweight: Secondary | ICD-10-CM | POA: Diagnosis not present

## 2021-09-28 DIAGNOSIS — M546 Pain in thoracic spine: Secondary | ICD-10-CM | POA: Diagnosis not present

## 2021-09-28 DIAGNOSIS — E559 Vitamin D deficiency, unspecified: Secondary | ICD-10-CM | POA: Diagnosis not present

## 2021-10-24 DIAGNOSIS — E663 Overweight: Secondary | ICD-10-CM | POA: Diagnosis not present

## 2021-10-24 DIAGNOSIS — E1169 Type 2 diabetes mellitus with other specified complication: Secondary | ICD-10-CM | POA: Diagnosis not present

## 2021-10-24 DIAGNOSIS — E781 Pure hyperglyceridemia: Secondary | ICD-10-CM | POA: Diagnosis not present

## 2021-10-24 DIAGNOSIS — I10 Essential (primary) hypertension: Secondary | ICD-10-CM | POA: Diagnosis not present

## 2021-10-27 DIAGNOSIS — H40023 Open angle with borderline findings, high risk, bilateral: Secondary | ICD-10-CM | POA: Diagnosis not present

## 2021-11-02 ENCOUNTER — Encounter (INDEPENDENT_AMBULATORY_CARE_PROVIDER_SITE_OTHER): Payer: Self-pay

## 2021-11-17 DIAGNOSIS — H04123 Dry eye syndrome of bilateral lacrimal glands: Secondary | ICD-10-CM | POA: Diagnosis not present

## 2021-12-07 DIAGNOSIS — E1165 Type 2 diabetes mellitus with hyperglycemia: Secondary | ICD-10-CM | POA: Diagnosis not present

## 2021-12-07 DIAGNOSIS — E559 Vitamin D deficiency, unspecified: Secondary | ICD-10-CM | POA: Diagnosis not present

## 2021-12-07 DIAGNOSIS — I10 Essential (primary) hypertension: Secondary | ICD-10-CM | POA: Diagnosis not present

## 2021-12-07 DIAGNOSIS — E663 Overweight: Secondary | ICD-10-CM | POA: Diagnosis not present

## 2022-01-09 DIAGNOSIS — Z6826 Body mass index (BMI) 26.0-26.9, adult: Secondary | ICD-10-CM | POA: Diagnosis not present

## 2022-01-09 DIAGNOSIS — R632 Polyphagia: Secondary | ICD-10-CM | POA: Diagnosis not present

## 2022-01-09 DIAGNOSIS — K760 Fatty (change of) liver, not elsewhere classified: Secondary | ICD-10-CM | POA: Diagnosis not present

## 2022-01-09 DIAGNOSIS — E1169 Type 2 diabetes mellitus with other specified complication: Secondary | ICD-10-CM | POA: Diagnosis not present

## 2022-01-30 DIAGNOSIS — Z8639 Personal history of other endocrine, nutritional and metabolic disease: Secondary | ICD-10-CM | POA: Diagnosis not present

## 2022-01-30 DIAGNOSIS — E663 Overweight: Secondary | ICD-10-CM | POA: Diagnosis not present

## 2022-01-30 DIAGNOSIS — I152 Hypertension secondary to endocrine disorders: Secondary | ICD-10-CM | POA: Diagnosis not present

## 2022-01-30 DIAGNOSIS — E1169 Type 2 diabetes mellitus with other specified complication: Secondary | ICD-10-CM | POA: Diagnosis not present

## 2022-01-30 DIAGNOSIS — Z6826 Body mass index (BMI) 26.0-26.9, adult: Secondary | ICD-10-CM | POA: Diagnosis not present

## 2022-01-30 DIAGNOSIS — K760 Fatty (change of) liver, not elsewhere classified: Secondary | ICD-10-CM | POA: Diagnosis not present

## 2022-03-08 DIAGNOSIS — F4321 Adjustment disorder with depressed mood: Secondary | ICD-10-CM | POA: Diagnosis not present

## 2022-03-08 DIAGNOSIS — K760 Fatty (change of) liver, not elsewhere classified: Secondary | ICD-10-CM | POA: Diagnosis not present

## 2022-03-08 DIAGNOSIS — Z8639 Personal history of other endocrine, nutritional and metabolic disease: Secondary | ICD-10-CM | POA: Diagnosis not present

## 2022-03-08 DIAGNOSIS — E1169 Type 2 diabetes mellitus with other specified complication: Secondary | ICD-10-CM | POA: Diagnosis not present

## 2022-03-08 DIAGNOSIS — E663 Overweight: Secondary | ICD-10-CM | POA: Diagnosis not present

## 2022-03-08 DIAGNOSIS — I10 Essential (primary) hypertension: Secondary | ICD-10-CM | POA: Diagnosis not present

## 2022-03-08 DIAGNOSIS — Z6826 Body mass index (BMI) 26.0-26.9, adult: Secondary | ICD-10-CM | POA: Diagnosis not present

## 2022-04-24 DIAGNOSIS — E663 Overweight: Secondary | ICD-10-CM | POA: Diagnosis not present

## 2022-04-24 DIAGNOSIS — Z6825 Body mass index (BMI) 25.0-25.9, adult: Secondary | ICD-10-CM | POA: Diagnosis not present

## 2022-04-24 DIAGNOSIS — E1169 Type 2 diabetes mellitus with other specified complication: Secondary | ICD-10-CM | POA: Diagnosis not present

## 2022-04-24 DIAGNOSIS — K760 Fatty (change of) liver, not elsewhere classified: Secondary | ICD-10-CM | POA: Diagnosis not present

## 2022-04-24 DIAGNOSIS — Z8639 Personal history of other endocrine, nutritional and metabolic disease: Secondary | ICD-10-CM | POA: Diagnosis not present

## 2022-04-24 DIAGNOSIS — F3341 Major depressive disorder, recurrent, in partial remission: Secondary | ICD-10-CM | POA: Diagnosis not present

## 2022-05-04 DIAGNOSIS — E119 Type 2 diabetes mellitus without complications: Secondary | ICD-10-CM | POA: Diagnosis not present

## 2022-05-04 DIAGNOSIS — H40023 Open angle with borderline findings, high risk, bilateral: Secondary | ICD-10-CM | POA: Diagnosis not present

## 2022-05-04 DIAGNOSIS — H04123 Dry eye syndrome of bilateral lacrimal glands: Secondary | ICD-10-CM | POA: Diagnosis not present

## 2022-05-04 DIAGNOSIS — H2513 Age-related nuclear cataract, bilateral: Secondary | ICD-10-CM | POA: Diagnosis not present

## 2022-06-12 DIAGNOSIS — H40023 Open angle with borderline findings, high risk, bilateral: Secondary | ICD-10-CM | POA: Diagnosis not present

## 2022-06-12 DIAGNOSIS — F4321 Adjustment disorder with depressed mood: Secondary | ICD-10-CM | POA: Diagnosis not present

## 2022-06-12 DIAGNOSIS — Z6825 Body mass index (BMI) 25.0-25.9, adult: Secondary | ICD-10-CM | POA: Diagnosis not present

## 2022-06-12 DIAGNOSIS — E1169 Type 2 diabetes mellitus with other specified complication: Secondary | ICD-10-CM | POA: Diagnosis not present

## 2022-06-12 DIAGNOSIS — K760 Fatty (change of) liver, not elsewhere classified: Secondary | ICD-10-CM | POA: Diagnosis not present

## 2022-06-12 DIAGNOSIS — E663 Overweight: Secondary | ICD-10-CM | POA: Diagnosis not present

## 2022-06-12 DIAGNOSIS — Z8639 Personal history of other endocrine, nutritional and metabolic disease: Secondary | ICD-10-CM | POA: Diagnosis not present

## 2022-07-13 DIAGNOSIS — Z9049 Acquired absence of other specified parts of digestive tract: Secondary | ICD-10-CM | POA: Diagnosis not present

## 2022-07-13 DIAGNOSIS — E1169 Type 2 diabetes mellitus with other specified complication: Secondary | ICD-10-CM | POA: Diagnosis not present

## 2022-07-13 DIAGNOSIS — Z0000 Encounter for general adult medical examination without abnormal findings: Secondary | ICD-10-CM | POA: Diagnosis not present

## 2022-07-13 DIAGNOSIS — H409 Unspecified glaucoma: Secondary | ICD-10-CM | POA: Diagnosis not present

## 2022-07-13 DIAGNOSIS — K760 Fatty (change of) liver, not elsewhere classified: Secondary | ICD-10-CM | POA: Diagnosis not present

## 2022-07-13 DIAGNOSIS — E785 Hyperlipidemia, unspecified: Secondary | ICD-10-CM | POA: Diagnosis not present

## 2022-07-13 DIAGNOSIS — G720 Drug-induced myopathy: Secondary | ICD-10-CM | POA: Diagnosis not present

## 2022-07-13 DIAGNOSIS — E781 Pure hyperglyceridemia: Secondary | ICD-10-CM | POA: Diagnosis not present

## 2022-07-13 DIAGNOSIS — E559 Vitamin D deficiency, unspecified: Secondary | ICD-10-CM | POA: Diagnosis not present

## 2022-07-13 DIAGNOSIS — M8580 Other specified disorders of bone density and structure, unspecified site: Secondary | ICD-10-CM | POA: Diagnosis not present

## 2022-07-13 DIAGNOSIS — E119 Type 2 diabetes mellitus without complications: Secondary | ICD-10-CM | POA: Diagnosis not present

## 2022-07-13 DIAGNOSIS — I700 Atherosclerosis of aorta: Secondary | ICD-10-CM | POA: Diagnosis not present

## 2022-07-13 DIAGNOSIS — I10 Essential (primary) hypertension: Secondary | ICD-10-CM | POA: Diagnosis not present

## 2022-07-24 DIAGNOSIS — H2513 Age-related nuclear cataract, bilateral: Secondary | ICD-10-CM | POA: Diagnosis not present

## 2022-07-24 DIAGNOSIS — H538 Other visual disturbances: Secondary | ICD-10-CM | POA: Diagnosis not present

## 2022-07-24 DIAGNOSIS — H40023 Open angle with borderline findings, high risk, bilateral: Secondary | ICD-10-CM | POA: Diagnosis not present

## 2022-07-24 DIAGNOSIS — H04123 Dry eye syndrome of bilateral lacrimal glands: Secondary | ICD-10-CM | POA: Diagnosis not present

## 2022-08-03 DIAGNOSIS — E663 Overweight: Secondary | ICD-10-CM | POA: Diagnosis not present

## 2022-08-03 DIAGNOSIS — E1169 Type 2 diabetes mellitus with other specified complication: Secondary | ICD-10-CM | POA: Diagnosis not present

## 2022-08-03 DIAGNOSIS — R5383 Other fatigue: Secondary | ICD-10-CM | POA: Diagnosis not present

## 2022-08-03 DIAGNOSIS — E7849 Other hyperlipidemia: Secondary | ICD-10-CM | POA: Diagnosis not present

## 2022-08-03 DIAGNOSIS — Z6825 Body mass index (BMI) 25.0-25.9, adult: Secondary | ICD-10-CM | POA: Diagnosis not present

## 2022-08-14 DIAGNOSIS — M546 Pain in thoracic spine: Secondary | ICD-10-CM | POA: Diagnosis not present

## 2022-08-14 DIAGNOSIS — M542 Cervicalgia: Secondary | ICD-10-CM | POA: Diagnosis not present

## 2022-08-14 DIAGNOSIS — M545 Low back pain, unspecified: Secondary | ICD-10-CM | POA: Diagnosis not present

## 2022-08-22 DIAGNOSIS — M546 Pain in thoracic spine: Secondary | ICD-10-CM | POA: Diagnosis not present

## 2022-08-22 DIAGNOSIS — M542 Cervicalgia: Secondary | ICD-10-CM | POA: Diagnosis not present

## 2022-08-23 DIAGNOSIS — M546 Pain in thoracic spine: Secondary | ICD-10-CM | POA: Diagnosis not present

## 2022-08-23 DIAGNOSIS — M542 Cervicalgia: Secondary | ICD-10-CM | POA: Diagnosis not present

## 2022-08-23 DIAGNOSIS — H40023 Open angle with borderline findings, high risk, bilateral: Secondary | ICD-10-CM | POA: Diagnosis not present

## 2022-08-23 DIAGNOSIS — R531 Weakness: Secondary | ICD-10-CM | POA: Diagnosis not present

## 2022-08-24 DIAGNOSIS — R0789 Other chest pain: Secondary | ICD-10-CM | POA: Diagnosis not present

## 2022-08-30 DIAGNOSIS — R531 Weakness: Secondary | ICD-10-CM | POA: Diagnosis not present

## 2022-08-30 DIAGNOSIS — M546 Pain in thoracic spine: Secondary | ICD-10-CM | POA: Diagnosis not present

## 2022-08-30 DIAGNOSIS — M542 Cervicalgia: Secondary | ICD-10-CM | POA: Diagnosis not present

## 2022-08-31 DIAGNOSIS — M542 Cervicalgia: Secondary | ICD-10-CM | POA: Diagnosis not present

## 2022-08-31 DIAGNOSIS — M546 Pain in thoracic spine: Secondary | ICD-10-CM | POA: Diagnosis not present

## 2022-08-31 DIAGNOSIS — R531 Weakness: Secondary | ICD-10-CM | POA: Diagnosis not present

## 2022-09-04 DIAGNOSIS — M546 Pain in thoracic spine: Secondary | ICD-10-CM | POA: Diagnosis not present

## 2022-09-04 DIAGNOSIS — M542 Cervicalgia: Secondary | ICD-10-CM | POA: Diagnosis not present

## 2022-09-05 DIAGNOSIS — M542 Cervicalgia: Secondary | ICD-10-CM | POA: Diagnosis not present

## 2022-09-05 DIAGNOSIS — M546 Pain in thoracic spine: Secondary | ICD-10-CM | POA: Diagnosis not present

## 2022-09-05 DIAGNOSIS — R531 Weakness: Secondary | ICD-10-CM | POA: Diagnosis not present

## 2022-09-07 DIAGNOSIS — M546 Pain in thoracic spine: Secondary | ICD-10-CM | POA: Diagnosis not present

## 2022-09-07 DIAGNOSIS — M542 Cervicalgia: Secondary | ICD-10-CM | POA: Diagnosis not present

## 2022-09-07 DIAGNOSIS — R531 Weakness: Secondary | ICD-10-CM | POA: Diagnosis not present

## 2022-09-12 DIAGNOSIS — R531 Weakness: Secondary | ICD-10-CM | POA: Diagnosis not present

## 2022-09-12 DIAGNOSIS — M546 Pain in thoracic spine: Secondary | ICD-10-CM | POA: Diagnosis not present

## 2022-09-12 DIAGNOSIS — M542 Cervicalgia: Secondary | ICD-10-CM | POA: Diagnosis not present

## 2022-09-14 DIAGNOSIS — M546 Pain in thoracic spine: Secondary | ICD-10-CM | POA: Diagnosis not present

## 2022-09-14 DIAGNOSIS — R531 Weakness: Secondary | ICD-10-CM | POA: Diagnosis not present

## 2022-09-14 DIAGNOSIS — M542 Cervicalgia: Secondary | ICD-10-CM | POA: Diagnosis not present

## 2022-09-19 DIAGNOSIS — M546 Pain in thoracic spine: Secondary | ICD-10-CM | POA: Diagnosis not present

## 2022-09-19 DIAGNOSIS — M542 Cervicalgia: Secondary | ICD-10-CM | POA: Diagnosis not present

## 2022-09-19 DIAGNOSIS — R531 Weakness: Secondary | ICD-10-CM | POA: Diagnosis not present

## 2022-09-21 DIAGNOSIS — E1169 Type 2 diabetes mellitus with other specified complication: Secondary | ICD-10-CM | POA: Diagnosis not present

## 2022-09-21 DIAGNOSIS — M542 Cervicalgia: Secondary | ICD-10-CM | POA: Diagnosis not present

## 2022-09-21 DIAGNOSIS — Z6824 Body mass index (BMI) 24.0-24.9, adult: Secondary | ICD-10-CM | POA: Diagnosis not present

## 2022-09-21 DIAGNOSIS — M546 Pain in thoracic spine: Secondary | ICD-10-CM | POA: Diagnosis not present

## 2022-09-21 DIAGNOSIS — R531 Weakness: Secondary | ICD-10-CM | POA: Diagnosis not present

## 2022-09-21 DIAGNOSIS — G8929 Other chronic pain: Secondary | ICD-10-CM | POA: Diagnosis not present

## 2022-09-21 DIAGNOSIS — M792 Neuralgia and neuritis, unspecified: Secondary | ICD-10-CM | POA: Diagnosis not present

## 2022-09-21 DIAGNOSIS — Z8639 Personal history of other endocrine, nutritional and metabolic disease: Secondary | ICD-10-CM | POA: Diagnosis not present

## 2022-10-05 ENCOUNTER — Ambulatory Visit
Admission: RE | Admit: 2022-10-05 | Discharge: 2022-10-05 | Disposition: A | Discharge status: Home / Self Care | Payer: PPO | Source: Ambulatory Visit | Attending: Family Medicine | Admitting: Family Medicine

## 2022-10-05 ENCOUNTER — Other Ambulatory Visit: Payer: Self-pay | Admitting: Family Medicine

## 2022-10-05 DIAGNOSIS — Z1231 Encounter for screening mammogram for malignant neoplasm of breast: Secondary | ICD-10-CM

## 2022-10-10 DIAGNOSIS — M542 Cervicalgia: Secondary | ICD-10-CM | POA: Diagnosis not present

## 2022-10-10 DIAGNOSIS — M546 Pain in thoracic spine: Secondary | ICD-10-CM | POA: Diagnosis not present

## 2022-10-10 DIAGNOSIS — R531 Weakness: Secondary | ICD-10-CM | POA: Diagnosis not present

## 2022-10-17 DIAGNOSIS — R531 Weakness: Secondary | ICD-10-CM | POA: Diagnosis not present

## 2022-10-17 DIAGNOSIS — M546 Pain in thoracic spine: Secondary | ICD-10-CM | POA: Diagnosis not present

## 2022-10-17 DIAGNOSIS — M542 Cervicalgia: Secondary | ICD-10-CM | POA: Diagnosis not present

## 2022-10-19 DIAGNOSIS — R531 Weakness: Secondary | ICD-10-CM | POA: Diagnosis not present

## 2022-10-19 DIAGNOSIS — M542 Cervicalgia: Secondary | ICD-10-CM | POA: Diagnosis not present

## 2022-10-19 DIAGNOSIS — M546 Pain in thoracic spine: Secondary | ICD-10-CM | POA: Diagnosis not present

## 2022-10-25 DIAGNOSIS — R531 Weakness: Secondary | ICD-10-CM | POA: Diagnosis not present

## 2022-10-25 DIAGNOSIS — M546 Pain in thoracic spine: Secondary | ICD-10-CM | POA: Diagnosis not present

## 2022-10-25 DIAGNOSIS — M542 Cervicalgia: Secondary | ICD-10-CM | POA: Diagnosis not present

## 2022-10-27 DIAGNOSIS — M542 Cervicalgia: Secondary | ICD-10-CM | POA: Diagnosis not present

## 2022-10-27 DIAGNOSIS — M546 Pain in thoracic spine: Secondary | ICD-10-CM | POA: Diagnosis not present

## 2022-10-27 DIAGNOSIS — R531 Weakness: Secondary | ICD-10-CM | POA: Diagnosis not present

## 2022-10-30 DIAGNOSIS — M542 Cervicalgia: Secondary | ICD-10-CM | POA: Diagnosis not present

## 2022-10-30 DIAGNOSIS — M546 Pain in thoracic spine: Secondary | ICD-10-CM | POA: Diagnosis not present

## 2022-10-30 DIAGNOSIS — R531 Weakness: Secondary | ICD-10-CM | POA: Diagnosis not present

## 2022-11-01 DIAGNOSIS — M546 Pain in thoracic spine: Secondary | ICD-10-CM | POA: Diagnosis not present

## 2022-11-01 DIAGNOSIS — M542 Cervicalgia: Secondary | ICD-10-CM | POA: Diagnosis not present

## 2022-11-01 DIAGNOSIS — R531 Weakness: Secondary | ICD-10-CM | POA: Diagnosis not present

## 2022-11-06 DIAGNOSIS — M542 Cervicalgia: Secondary | ICD-10-CM | POA: Diagnosis not present

## 2022-11-06 DIAGNOSIS — M546 Pain in thoracic spine: Secondary | ICD-10-CM | POA: Diagnosis not present

## 2022-11-06 DIAGNOSIS — R531 Weakness: Secondary | ICD-10-CM | POA: Diagnosis not present

## 2022-11-08 DIAGNOSIS — E559 Vitamin D deficiency, unspecified: Secondary | ICD-10-CM | POA: Diagnosis not present

## 2022-11-08 DIAGNOSIS — E1169 Type 2 diabetes mellitus with other specified complication: Secondary | ICD-10-CM | POA: Diagnosis not present

## 2022-11-08 DIAGNOSIS — Z6823 Body mass index (BMI) 23.0-23.9, adult: Secondary | ICD-10-CM | POA: Diagnosis not present

## 2022-11-08 DIAGNOSIS — Z79899 Other long term (current) drug therapy: Secondary | ICD-10-CM | POA: Diagnosis not present

## 2022-11-08 DIAGNOSIS — R531 Weakness: Secondary | ICD-10-CM | POA: Diagnosis not present

## 2022-11-08 DIAGNOSIS — M542 Cervicalgia: Secondary | ICD-10-CM | POA: Diagnosis not present

## 2022-11-08 DIAGNOSIS — E785 Hyperlipidemia, unspecified: Secondary | ICD-10-CM | POA: Diagnosis not present

## 2022-11-08 DIAGNOSIS — M546 Pain in thoracic spine: Secondary | ICD-10-CM | POA: Diagnosis not present

## 2022-11-08 DIAGNOSIS — Z8639 Personal history of other endocrine, nutritional and metabolic disease: Secondary | ICD-10-CM | POA: Diagnosis not present

## 2022-11-08 DIAGNOSIS — M792 Neuralgia and neuritis, unspecified: Secondary | ICD-10-CM | POA: Diagnosis not present

## 2022-11-13 DIAGNOSIS — R531 Weakness: Secondary | ICD-10-CM | POA: Diagnosis not present

## 2022-11-13 DIAGNOSIS — M542 Cervicalgia: Secondary | ICD-10-CM | POA: Diagnosis not present

## 2022-11-13 DIAGNOSIS — M546 Pain in thoracic spine: Secondary | ICD-10-CM | POA: Diagnosis not present

## 2022-11-15 DIAGNOSIS — M546 Pain in thoracic spine: Secondary | ICD-10-CM | POA: Diagnosis not present

## 2022-11-15 DIAGNOSIS — M542 Cervicalgia: Secondary | ICD-10-CM | POA: Diagnosis not present

## 2022-11-15 DIAGNOSIS — R531 Weakness: Secondary | ICD-10-CM | POA: Diagnosis not present

## 2022-11-22 DIAGNOSIS — M542 Cervicalgia: Secondary | ICD-10-CM | POA: Diagnosis not present

## 2022-11-22 DIAGNOSIS — M546 Pain in thoracic spine: Secondary | ICD-10-CM | POA: Diagnosis not present

## 2022-11-22 DIAGNOSIS — R531 Weakness: Secondary | ICD-10-CM | POA: Diagnosis not present

## 2022-11-24 DIAGNOSIS — M542 Cervicalgia: Secondary | ICD-10-CM | POA: Diagnosis not present

## 2022-11-24 DIAGNOSIS — R531 Weakness: Secondary | ICD-10-CM | POA: Diagnosis not present

## 2022-11-24 DIAGNOSIS — M546 Pain in thoracic spine: Secondary | ICD-10-CM | POA: Diagnosis not present

## 2022-11-29 DIAGNOSIS — M542 Cervicalgia: Secondary | ICD-10-CM | POA: Diagnosis not present

## 2022-11-29 DIAGNOSIS — M546 Pain in thoracic spine: Secondary | ICD-10-CM | POA: Diagnosis not present

## 2022-11-29 DIAGNOSIS — R531 Weakness: Secondary | ICD-10-CM | POA: Diagnosis not present

## 2022-11-30 DIAGNOSIS — H40023 Open angle with borderline findings, high risk, bilateral: Secondary | ICD-10-CM | POA: Diagnosis not present

## 2022-12-01 DIAGNOSIS — R531 Weakness: Secondary | ICD-10-CM | POA: Diagnosis not present

## 2022-12-01 DIAGNOSIS — M546 Pain in thoracic spine: Secondary | ICD-10-CM | POA: Diagnosis not present

## 2022-12-01 DIAGNOSIS — M542 Cervicalgia: Secondary | ICD-10-CM | POA: Diagnosis not present

## 2022-12-03 ENCOUNTER — Ambulatory Visit (HOSPITAL_COMMUNITY)
Admission: EM | Admit: 2022-12-03 | Discharge: 2022-12-03 | Disposition: A | Discharge status: Home / Self Care | Payer: PPO

## 2022-12-03 ENCOUNTER — Emergency Department (HOSPITAL_COMMUNITY): Payer: PPO

## 2022-12-03 ENCOUNTER — Other Ambulatory Visit: Payer: Self-pay

## 2022-12-03 ENCOUNTER — Observation Stay (HOSPITAL_COMMUNITY)
Admission: EM | Admit: 2022-12-03 | Discharge: 2022-12-04 | Disposition: A | Discharge status: Home / Self Care | Payer: PPO | Attending: Surgery | Admitting: Surgery

## 2022-12-03 ENCOUNTER — Encounter (HOSPITAL_COMMUNITY): Payer: Self-pay | Admitting: *Deleted

## 2022-12-03 ENCOUNTER — Encounter (HOSPITAL_COMMUNITY): Payer: Self-pay | Admitting: Emergency Medicine

## 2022-12-03 DIAGNOSIS — E88819 Insulin resistance, unspecified: Secondary | ICD-10-CM

## 2022-12-03 DIAGNOSIS — Z79899 Other long term (current) drug therapy: Secondary | ICD-10-CM | POA: Diagnosis not present

## 2022-12-03 DIAGNOSIS — K449 Diaphragmatic hernia without obstruction or gangrene: Secondary | ICD-10-CM | POA: Diagnosis not present

## 2022-12-03 DIAGNOSIS — R109 Unspecified abdominal pain: Secondary | ICD-10-CM

## 2022-12-03 DIAGNOSIS — R55 Syncope and collapse: Secondary | ICD-10-CM | POA: Diagnosis not present

## 2022-12-03 DIAGNOSIS — K3580 Unspecified acute appendicitis: Secondary | ICD-10-CM | POA: Diagnosis not present

## 2022-12-03 DIAGNOSIS — E119 Type 2 diabetes mellitus without complications: Secondary | ICD-10-CM | POA: Insufficient documentation

## 2022-12-03 DIAGNOSIS — N200 Calculus of kidney: Secondary | ICD-10-CM | POA: Diagnosis not present

## 2022-12-03 DIAGNOSIS — R112 Nausea with vomiting, unspecified: Secondary | ICD-10-CM | POA: Diagnosis not present

## 2022-12-03 DIAGNOSIS — Z7984 Long term (current) use of oral hypoglycemic drugs: Secondary | ICD-10-CM | POA: Diagnosis not present

## 2022-12-03 DIAGNOSIS — D259 Leiomyoma of uterus, unspecified: Secondary | ICD-10-CM | POA: Diagnosis not present

## 2022-12-03 DIAGNOSIS — I10 Essential (primary) hypertension: Secondary | ICD-10-CM | POA: Diagnosis not present

## 2022-12-03 DIAGNOSIS — K37 Unspecified appendicitis: Secondary | ICD-10-CM | POA: Diagnosis present

## 2022-12-03 DIAGNOSIS — K3589 Other acute appendicitis without perforation or gangrene: Secondary | ICD-10-CM | POA: Diagnosis not present

## 2022-12-03 DIAGNOSIS — K5730 Diverticulosis of large intestine without perforation or abscess without bleeding: Secondary | ICD-10-CM | POA: Diagnosis not present

## 2022-12-03 LAB — BASIC METABOLIC PANEL
Anion gap: 14 (ref 5–15)
BUN: 16 mg/dL (ref 8–23)
CO2: 23 mmol/L (ref 22–32)
Calcium: 9.4 mg/dL (ref 8.9–10.3)
Chloride: 100 mmol/L (ref 98–111)
Creatinine, Ser: 0.89 mg/dL (ref 0.44–1.00)
GFR, Estimated: >60 mL/min (ref >60)
Glucose, Bld: 138 mg/dL — ABNORMAL HIGH (ref 70–99)
Potassium: 4.5 mmol/L (ref 3.5–5.1)
Sodium: 137 mmol/L (ref 135–145)

## 2022-12-03 LAB — CBC
HCT: 44.8 % (ref 36.0–46.0)
Hemoglobin: 15 g/dL (ref 12.0–15.0)
MCH: 30 pg (ref 26.0–34.0)
MCHC: 33.5 g/dL (ref 30.0–36.0)
MCV: 89.6 fL (ref 80.0–100.0)
Platelets: 213 10*3/uL (ref 150–400)
RBC: 5 MIL/uL (ref 3.87–5.11)
RDW: 13 % (ref 11.5–15.5)
WBC: 12 10*3/uL — ABNORMAL HIGH (ref 4.0–10.5)
nRBC: 0 % (ref 0.0–0.2)

## 2022-12-03 LAB — TROPONIN I (HIGH SENSITIVITY)
Troponin I (High Sensitivity): 3 ng/L (ref <18)
Troponin I (High Sensitivity): 3 ng/L (ref <18)

## 2022-12-03 LAB — CBG MONITORING, ED: Glucose-Capillary: 153 mg/dL — ABNORMAL HIGH (ref 70–99)

## 2022-12-03 MED ORDER — ONDANSETRON 4 MG PO TBDP
8.0000 mg | ORAL_TABLET | Freq: Once | ORAL | Status: AC
  Administered 2022-12-03: 8 mg via ORAL
  Filled 2022-12-03: qty 2

## 2022-12-03 MED ORDER — LACTATED RINGERS IV BOLUS
1000.0000 mL | Freq: Once | INTRAVENOUS | Status: AC
  Administered 2022-12-03: 1000 mL via INTRAVENOUS

## 2022-12-03 MED ORDER — FENTANYL CITRATE PF 50 MCG/ML IJ SOSY
50.0000 ug | PREFILLED_SYRINGE | Freq: Once | INTRAMUSCULAR | Status: AC
  Administered 2022-12-03: 50 ug via INTRAVENOUS
  Filled 2022-12-03: qty 1

## 2022-12-03 MED ORDER — SODIUM CHLORIDE 0.9 % IV SOLN
12.5000 mg | Freq: Once | INTRAVENOUS | Status: AC
  Administered 2022-12-03: 12.5 mg via INTRAVENOUS
  Filled 2022-12-03: qty 0.5

## 2022-12-03 NOTE — ED Provider Notes (Signed)
[place] EMERGENCY DEPARTMENT AT [facility] Provider Note   CSN: [id number] Arrival date & time: 12/03/22  1638     History {Add pertinent medical, surgical, social history, OB history to HPI:1} Chief Complaint  Patient presents with   Chest Pain    Lauren James is a 72 y.o. female.   Chest Pain      Home Medications Prior to Admission medications   Medication Sig Start Date End Date Taking? Authorizing Provider  cholecalciferol (VITAMIN D3) 25 MCG (1000 UNIT) tablet Take 1,000 Units by mouth daily.    [provider]  glucose blood (IGLUCOSE TEST STRIPS) test strip Twice daily (use Bayer brand or whichever ins covers) 03/07/18   Corinna Capra A, DO  JARDIANCE 10 MG TABS tablet Take 10 mg by mouth daily. 04/19/21   [provider]  latanoprost (XALATAN) 0.005 % ophthalmic solution 1 drop at bedtime. 04/05/21   [provider]  meclizine (ANTIVERT) 12.5 MG tablet Take 1 tablet (12.5 mg total) by mouth 3 (three) times daily as needed for dizziness. 04/28/21   Windell Norfolk, MD  metFORMIN (GLUCOPHAGE-XR) 500 MG 24 hr tablet Take 1,000-1,500 mg by mouth See admin instructions. Take 1000 mg by mouth in the morning, may take an additional 5 mg if needed for blood sugar of 180 or higher 07/12/20   [provider]  valsartan (DIOVAN) 320 MG tablet  04/05/21   [provider]      Allergies    Adhesive [tape]    Review of Systems   Review of Systems  Cardiovascular:  Positive for chest pain.    Physical Exam Updated Vital Signs BP (!) 173/79   Pulse 66   Temp 97.8 F (36.6 C) (Oral)   Resp (!) 21   Ht 5\' 4"  (1.626 m)   Wt 74 kg   SpO2 100%   BMI 28.00 kg/m  Physical Exam  ED Results / Procedures / Treatments   Labs (all labs ordered are listed, but only abnormal results are displayed) Labs Reviewed  BASIC METABOLIC PANEL - Abnormal; Notable for the following components:      Result Value   Glucose, Bld 138 (*)     All other components within normal limits  CBC - Abnormal; Notable for the following components:   WBC 12.0 (*)    All other components within normal limits  TROPONIN I (HIGH SENSITIVITY)  TROPONIN I (HIGH SENSITIVITY)    EKG None  Radiology DG Chest 1 View  Result Date: 12/03/2022 CLINICAL DATA:  Is syncopal episode. EXAM: CHEST  1 VIEW COMPARISON:  CT of the chest October 25, 2020 FINDINGS: Cardiomediastinal silhouette is normal. Mediastinal contours appear intact. There is no evidence of focal airspace consolidation, pleural effusion or pneumothorax. Osseous structures are without acute abnormality. Soft tissues are grossly normal. Cervical fusion noted. IMPRESSION: No active disease. Electronically Signed   By: Ted Mcalpine M.D.   On: 12/03/2022 18:09    Procedures Procedures  {Document cardiac monitor, telemetry assessment procedure when appropriate:1}  Medications Ordered in ED Medications  ondansetron (ZOFRAN-ODT) disintegrating tablet 8 mg (8 mg Oral Given 12/03/22 1700)    ED Course/ Medical Decision Making/ A&P   {   Click here for ABCD2, HEART and other calculatorsREFRESH Note before signing :1}                              Medical Decision Making Amount  and/or Complexity of Data Reviewed Labs: ordered. Radiology: ordered.  Risk Prescription drug management.   ***  {Document critical care time when appropriate:1} {Document review of labs and clinical decision tools ie heart score, Chads2Vasc2 etc:1}  {Document your independent review of radiology images, and any outside records:1} {Document your discussion with family members, caretakers, and with consultants:1} {Document social determinants of health affecting pt's care:1} {Document your decision making why or why not admission, treatments were needed:1} Final Clinical Impression(s) / ED Diagnoses Final diagnoses:  None    Rx / DC Orders ED Discharge Orders     None

## 2022-12-03 NOTE — ED Triage Notes (Signed)
Pt is c/o generalized abd pain that started this morning with nausea.

## 2022-12-03 NOTE — ED Triage Notes (Signed)
The pt is c/o pain in her chest and her ribs bi-laterally  no injury  she also has nausea and vomiting no previous history

## 2022-12-03 NOTE — ED Provider Notes (Signed)
Patient presents today with generalized left abdominal pain that started this morning.  She has had some nausea and an episode of vomiting.  She denies any diarrhea or constipation and did have bowel movement today.  She has not had fever.  She does not report treatment for symptoms.  Given severity of pain recommended further evaluation in the emergency room to rule out diverticulitis or other acute abdominal cause.  Patient is agreeable and husband will take her POV.   Tomi Bamberger, PA-C 12/03/22 [phone]

## 2022-12-03 NOTE — ED Notes (Signed)
Patient is being discharged from the Urgent Care and sent to the Emergency Department via POV . Per Erma Pinto, PA, patient is in need of higher level of care due to severe generalized abd pain. Patient is aware and verbalizes understanding of plan of care.  Vitals:   12/03/22 1616  BP: (!) 144/71  Pulse: (!) 54  Resp: 18  Temp: 98 F (36.7 C)  SpO2: 97%

## 2022-12-04 ENCOUNTER — Encounter (HOSPITAL_COMMUNITY): Payer: Self-pay | Admitting: General Surgery

## 2022-12-04 ENCOUNTER — Other Ambulatory Visit: Payer: Self-pay

## 2022-12-04 ENCOUNTER — Emergency Department (HOSPITAL_COMMUNITY): Payer: PPO

## 2022-12-04 ENCOUNTER — Encounter (HOSPITAL_COMMUNITY)
Admission: EM | Disposition: A | Discharge status: Home / Self Care | Payer: Self-pay | Source: Home / Self Care | Attending: Emergency Medicine

## 2022-12-04 ENCOUNTER — Observation Stay (HOSPITAL_BASED_OUTPATIENT_CLINIC_OR_DEPARTMENT_OTHER): Payer: PPO

## 2022-12-04 ENCOUNTER — Observation Stay (HOSPITAL_COMMUNITY): Payer: PPO

## 2022-12-04 DIAGNOSIS — K3580 Unspecified acute appendicitis: Secondary | ICD-10-CM | POA: Diagnosis not present

## 2022-12-04 DIAGNOSIS — K353 Acute appendicitis with localized peritonitis, without perforation or gangrene: Secondary | ICD-10-CM | POA: Diagnosis not present

## 2022-12-04 DIAGNOSIS — D259 Leiomyoma of uterus, unspecified: Secondary | ICD-10-CM | POA: Diagnosis not present

## 2022-12-04 DIAGNOSIS — K5730 Diverticulosis of large intestine without perforation or abscess without bleeding: Secondary | ICD-10-CM | POA: Diagnosis not present

## 2022-12-04 DIAGNOSIS — E119 Type 2 diabetes mellitus without complications: Secondary | ICD-10-CM | POA: Diagnosis not present

## 2022-12-04 DIAGNOSIS — Z7984 Long term (current) use of oral hypoglycemic drugs: Secondary | ICD-10-CM | POA: Diagnosis not present

## 2022-12-04 DIAGNOSIS — K449 Diaphragmatic hernia without obstruction or gangrene: Secondary | ICD-10-CM | POA: Diagnosis not present

## 2022-12-04 DIAGNOSIS — E785 Hyperlipidemia, unspecified: Secondary | ICD-10-CM

## 2022-12-04 DIAGNOSIS — I10 Essential (primary) hypertension: Secondary | ICD-10-CM | POA: Diagnosis not present

## 2022-12-04 DIAGNOSIS — K37 Unspecified appendicitis: Secondary | ICD-10-CM | POA: Diagnosis present

## 2022-12-04 DIAGNOSIS — N200 Calculus of kidney: Secondary | ICD-10-CM | POA: Diagnosis not present

## 2022-12-04 HISTORY — PX: LAPAROSCOPIC APPENDECTOMY: SHX408

## 2022-12-04 LAB — URINALYSIS, ROUTINE W REFLEX MICROSCOPIC
Bacteria, UA: NONE SEEN
Bilirubin Urine: NEGATIVE
Glucose, UA: NEGATIVE mg/dL
Ketones, ur: 20 mg/dL — AB
Leukocytes,Ua: NEGATIVE
Nitrite: NEGATIVE
Protein, ur: NEGATIVE mg/dL
Specific Gravity, Urine: >1.046 — ABNORMAL HIGH (ref 1.005–1.030)
pH: 5 (ref 5.0–8.0)

## 2022-12-04 LAB — HEPATIC FUNCTION PANEL
ALT: 31 U/L (ref 0–44)
AST: 27 U/L (ref 15–41)
Albumin: 4.2 g/dL (ref 3.5–5.0)
Alkaline Phosphatase: 56 U/L (ref 38–126)
Bilirubin, Direct: <0.1 mg/dL (ref 0.0–0.2)
Total Bilirubin: 0.7 mg/dL (ref 0.3–1.2)
Total Protein: 7.8 g/dL (ref 6.5–8.1)

## 2022-12-04 LAB — GLUCOSE, CAPILLARY: Glucose-Capillary: 123 mg/dL — ABNORMAL HIGH (ref 70–99)

## 2022-12-04 LAB — LIPASE, BLOOD: Lipase: 39 U/L (ref 11–51)

## 2022-12-04 SURGERY — APPENDECTOMY, LAPAROSCOPIC
Anesthesia: General | Site: Abdomen

## 2022-12-04 MED ORDER — PHENYLEPHRINE 80 MCG/ML (10ML) SYRINGE FOR IV PUSH (FOR BLOOD PRESSURE SUPPORT)
PREFILLED_SYRINGE | INTRAVENOUS | Status: AC
  Filled 2022-12-04: qty 10

## 2022-12-04 MED ORDER — LACTATED RINGERS IV SOLN: INTRAVENOUS | Status: DC

## 2022-12-04 MED ORDER — HYDROMORPHONE HCL 1 MG/ML IJ SOLN
1.0000 mg | Freq: Once | INTRAMUSCULAR | Status: AC
  Administered 2022-12-04: 1 mg via INTRAVENOUS
  Filled 2022-12-04: qty 1

## 2022-12-04 MED ORDER — CHLORHEXIDINE GLUCONATE 0.12 % MT SOLN
OROMUCOSAL | Status: AC
  Filled 2022-12-04: qty 15

## 2022-12-04 MED ORDER — LORAZEPAM 2 MG/ML IJ SOLN
0.5000 mg | Freq: Once | INTRAMUSCULAR | Status: AC
  Administered 2022-12-04: 0.5 mg via INTRAVENOUS
  Filled 2022-12-04: qty 1

## 2022-12-04 MED ORDER — SUCCINYLCHOLINE CHLORIDE 200 MG/10ML IV SOSY
PREFILLED_SYRINGE | INTRAVENOUS | Status: AC
  Filled 2022-12-04: qty 10

## 2022-12-04 MED ORDER — 0.9 % SODIUM CHLORIDE (POUR BTL) OPTIME
TOPICAL | Status: DC | PRN
  Administered 2022-12-04: 1000 mL

## 2022-12-04 MED ORDER — LIDOCAINE 2% (20 MG/ML) 5 ML SYRINGE
INTRAMUSCULAR | Status: AC
  Filled 2022-12-04: qty 5

## 2022-12-04 MED ORDER — ACETAMINOPHEN 10 MG/ML IV SOLN
INTRAVENOUS | Status: DC | PRN
  Administered 2022-12-04: 1000 mg via INTRAVENOUS

## 2022-12-04 MED ORDER — CHLORHEXIDINE GLUCONATE 0.12 % MT SOLN
OROMUCOSAL | Status: AC
  Administered 2022-12-04: 15 mL via OROMUCOSAL
  Filled 2022-12-04: qty 15

## 2022-12-04 MED ORDER — DEXAMETHASONE SODIUM PHOSPHATE 10 MG/ML IJ SOLN
INTRAMUSCULAR | Status: DC | PRN
  Administered 2022-12-04: 10 mg via INTRAVENOUS

## 2022-12-04 MED ORDER — SODIUM CHLORIDE 0.9 % IR SOLN
Status: DC | PRN
  Administered 2022-12-04: 1000 mL

## 2022-12-04 MED ORDER — ROCURONIUM BROMIDE 10 MG/ML (PF) SYRINGE
PREFILLED_SYRINGE | INTRAVENOUS | Status: AC
  Filled 2022-12-04: qty 10

## 2022-12-04 MED ORDER — SUCCINYLCHOLINE CHLORIDE 200 MG/10ML IV SOSY
PREFILLED_SYRINGE | INTRAVENOUS | Status: DC | PRN
  Administered 2022-12-04: 130 mg via INTRAVENOUS

## 2022-12-04 MED ORDER — GLYCOPYRROLATE PF 0.2 MG/ML IJ SOSY
PREFILLED_SYRINGE | INTRAMUSCULAR | Status: AC
  Filled 2022-12-04: qty 1

## 2022-12-04 MED ORDER — GLYCOPYRROLATE 0.2 MG/ML IJ SOLN
INTRAMUSCULAR | Status: DC | PRN
Start: 2022-12-04 — End: 2022-12-04
  Administered 2022-12-04: .2 mg via INTRAVENOUS

## 2022-12-04 MED ORDER — PROPOFOL 10 MG/ML IV BOLUS
INTRAVENOUS | Status: AC
  Filled 2022-12-04: qty 20

## 2022-12-04 MED ORDER — OXYCODONE HCL 5 MG PO TABS: 5.0000 mg | ORAL_TABLET | Freq: Once | ORAL | Status: DC | PRN

## 2022-12-04 MED ORDER — PIPERACILLIN-TAZOBACTAM 3.375 G IVPB 30 MIN
3.3750 g | Freq: Once | INTRAVENOUS | Status: AC
  Administered 2022-12-04: 3.375 g via INTRAVENOUS
  Filled 2022-12-04: qty 50

## 2022-12-04 MED ORDER — METRONIDAZOLE 500 MG/100ML IV SOLN
INTRAVENOUS | Status: AC
  Filled 2022-12-04: qty 100

## 2022-12-04 MED ORDER — METRONIDAZOLE 500 MG/100ML IV SOLN
500.0000 mg | Freq: Three times a day (TID) | INTRAVENOUS | Status: DC
  Administered 2022-12-04: 500 mg via INTRAVENOUS

## 2022-12-04 MED ORDER — FENTANYL CITRATE (PF) 250 MCG/5ML IJ SOLN
INTRAMUSCULAR | Status: AC
  Filled 2022-12-04: qty 5

## 2022-12-04 MED ORDER — ORAL CARE MOUTH RINSE: 15.0000 mL | Freq: Once | OROMUCOSAL | Status: AC

## 2022-12-04 MED ORDER — BUPIVACAINE-EPINEPHRINE (PF) 0.25% -1:200000 IJ SOLN
INTRAMUSCULAR | Status: AC
  Filled 2022-12-04: qty 30

## 2022-12-04 MED ORDER — PROMETHAZINE HCL 25 MG/ML IJ SOLN: 6.2500 mg | INTRAMUSCULAR | Status: DC | PRN

## 2022-12-04 MED ORDER — ONDANSETRON HCL 4 MG/2ML IJ SOLN
INTRAMUSCULAR | Status: DC | PRN
  Administered 2022-12-04: 4 mg via INTRAVENOUS

## 2022-12-04 MED ORDER — HYDROMORPHONE HCL 1 MG/ML IJ SOLN: 0.2500 mg | INTRAMUSCULAR | Status: DC | PRN

## 2022-12-04 MED ORDER — OXYCODONE HCL 5 MG/5ML PO SOLN: 5.0000 mg | Freq: Once | ORAL | Status: DC | PRN

## 2022-12-04 MED ORDER — ONDANSETRON HCL 4 MG/2ML IJ SOLN
INTRAMUSCULAR | Status: AC
  Filled 2022-12-04: qty 2

## 2022-12-04 MED ORDER — LORAZEPAM 2 MG/ML IJ SOLN
1.0000 mg | Freq: Once | INTRAMUSCULAR | Status: DC
  Filled 2022-12-04: qty 1

## 2022-12-04 MED ORDER — OXYCODONE HCL 5 MG PO TABS: 5.0000 mg | ORAL_TABLET | Freq: Four times a day (QID) | ORAL | 0 refills | Status: DC | PRN

## 2022-12-04 MED ORDER — ROCURONIUM BROMIDE 10 MG/ML (PF) SYRINGE
PREFILLED_SYRINGE | INTRAVENOUS | Status: DC | PRN
  Administered 2022-12-04: 30 mg via INTRAVENOUS

## 2022-12-04 MED ORDER — DEXMEDETOMIDINE HCL IN NACL 200 MCG/50ML IV SOLN
INTRAVENOUS | Status: DC | PRN
Start: 2022-12-04 — End: 2022-12-04
  Administered 2022-12-04 (×3): 10 ug via INTRAVENOUS

## 2022-12-04 MED ORDER — FENTANYL CITRATE (PF) 250 MCG/5ML IJ SOLN
INTRAMUSCULAR | Status: DC | PRN
  Administered 2022-12-04: 100 ug via INTRAVENOUS

## 2022-12-04 MED ORDER — BUPIVACAINE-EPINEPHRINE 0.25% -1:200000 IJ SOLN
INTRAMUSCULAR | Status: DC | PRN
  Administered 2022-12-04: 30 mL

## 2022-12-04 MED ORDER — INSULIN ASPART 100 UNIT/ML IJ SOLN: 0.0000 [IU] | INTRAMUSCULAR | Status: DC | PRN

## 2022-12-04 MED ORDER — ALUM & MAG HYDROXIDE-SIMETH 200-200-20 MG/5ML PO SUSP
30.0000 mL | Freq: Once | ORAL | Status: AC
  Administered 2022-12-04: 30 mL via ORAL
  Filled 2022-12-04: qty 30

## 2022-12-04 MED ORDER — SODIUM CHLORIDE 0.9 % IV SOLN
INTRAVENOUS | Status: AC
  Filled 2022-12-04: qty 20

## 2022-12-04 MED ORDER — DEXAMETHASONE SODIUM PHOSPHATE 10 MG/ML IJ SOLN
INTRAMUSCULAR | Status: AC
  Filled 2022-12-04: qty 1

## 2022-12-04 MED ORDER — CHLORHEXIDINE GLUCONATE 0.12 % MT SOLN: 15.0000 mL | Freq: Once | OROMUCOSAL | Status: AC

## 2022-12-04 MED ORDER — ACETAMINOPHEN 10 MG/ML IV SOLN
INTRAVENOUS | Status: AC
  Filled 2022-12-04: qty 100

## 2022-12-04 MED ORDER — IOHEXOL 350 MG/ML SOLN
75.0000 mL | Freq: Once | INTRAVENOUS | Status: AC | PRN
  Administered 2022-12-04: 75 mL via INTRAVENOUS

## 2022-12-04 MED ORDER — SUGAMMADEX SODIUM 200 MG/2ML IV SOLN
INTRAVENOUS | Status: DC | PRN
  Administered 2022-12-04: 200 mg via INTRAVENOUS

## 2022-12-04 MED ORDER — CHLORHEXIDINE GLUCONATE CLOTH 2 % EX PADS: 6.0000 | MEDICATED_PAD | Freq: Once | CUTANEOUS | Status: DC

## 2022-12-04 MED ORDER — LIDOCAINE 2% (20 MG/ML) 5 ML SYRINGE
INTRAMUSCULAR | Status: DC | PRN
  Administered 2022-12-04: 80 mg via INTRAVENOUS

## 2022-12-04 MED ORDER — SODIUM CHLORIDE 0.9 % IV SOLN
2.0000 g | INTRAVENOUS | Status: AC
  Administered 2022-12-04: 2 g via INTRAVENOUS

## 2022-12-04 MED ORDER — PROPOFOL 10 MG/ML IV BOLUS
INTRAVENOUS | Status: DC | PRN
  Administered 2022-12-04: 30 mg via INTRAVENOUS
  Administered 2022-12-04: 20 mg via INTRAVENOUS
  Administered 2022-12-04: 140 mg via INTRAVENOUS

## 2022-12-04 MED ORDER — PHENYLEPHRINE HCL-NACL 20-0.9 MG/250ML-% IV SOLN
INTRAVENOUS | Status: DC | PRN
  Administered 2022-12-04: 80 ug via INTRAVENOUS
  Administered 2022-12-04: 100 ug via INTRAVENOUS
  Administered 2022-12-04: 80 ug via INTRAVENOUS

## 2022-12-04 SURGICAL SUPPLY — 40 items
ADH SKN CLS APL DERMABOND .7 (GAUZE/BANDAGES/DRESSINGS) ×1
APL PRP STRL LF DISP 70% ISPRP (MISCELLANEOUS) ×1
BAG COUNTER SPONGE SURGICOUNT (BAG) ×1 IMPLANT
BAG SPNG CNTER NS LX DISP (BAG) ×1
BLADE CLIPPER SURG (BLADE) IMPLANT
CANISTER SUCT 3000ML PPV (MISCELLANEOUS) ×1 IMPLANT
CHLORAPREP W/TINT 26 (MISCELLANEOUS) ×1 IMPLANT
COVER SURGICAL LIGHT HANDLE (MISCELLANEOUS) ×1 IMPLANT
CUTTER FLEX LINEAR 45M (STAPLE) IMPLANT
DERMABOND ADVANCED .7 DNX12 (GAUZE/BANDAGES/DRESSINGS) ×1 IMPLANT
ELECT REM PT RETURN 9FT ADLT (ELECTROSURGICAL) ×1
ELECTRODE REM PT RTRN 9FT ADLT (ELECTROSURGICAL) ×1 IMPLANT
GLOVE BIOGEL PI MICRO STRL 5.5 (GLOVE) ×1 IMPLANT
GLOVE SURG UNDER POLY LF SZ6 (GLOVE) ×1 IMPLANT
GOWN STRL REUS W/ TWL LRG LVL3 (GOWN DISPOSABLE) ×3 IMPLANT
GOWN STRL REUS W/TWL LRG LVL3 (GOWN DISPOSABLE) ×3
IRRIG SUCT STRYKERFLOW 2 WTIP (MISCELLANEOUS) ×1
IRRIGATION SUCT STRKRFLW 2 WTP (MISCELLANEOUS) IMPLANT
KIT BASIN OR (CUSTOM PROCEDURE TRAY) ×1 IMPLANT
KIT TURNOVER KIT B (KITS) ×1 IMPLANT
NS IRRIG 1000ML POUR BTL (IV SOLUTION) ×1 IMPLANT
PAD ARMBOARD 7.5X6 YLW CONV (MISCELLANEOUS) ×2 IMPLANT
PENCIL BUTTON HOLSTER BLD 10FT (ELECTRODE) ×1 IMPLANT
RELOAD STAPLE 45 3.5 BLU ETS (ENDOMECHANICALS) IMPLANT
RELOAD STAPLE TA45 3.5 REG BLU (ENDOMECHANICALS) ×1 IMPLANT
SET TUBE SMOKE EVAC HIGH FLOW (TUBING) ×1 IMPLANT
SHEARS HARMONIC ACE PLUS 36CM (ENDOMECHANICALS) IMPLANT
SLEEVE Z-THREAD 5X100MM (TROCAR) ×1 IMPLANT
SPECIMEN JAR SMALL (MISCELLANEOUS) ×1 IMPLANT
SUT MNCRL AB 4-0 PS2 18 (SUTURE) ×1 IMPLANT
SYS BAG RETRIEVAL 10MM (BASKET) ×1
SYSTEM BAG RETRIEVAL 10MM (BASKET) ×1 IMPLANT
TOWEL GREEN STERILE (TOWEL DISPOSABLE) ×1 IMPLANT
TOWEL GREEN STERILE FF (TOWEL DISPOSABLE) ×1 IMPLANT
TRAY FOLEY W/BAG SLVR 14FR (SET/KITS/TRAYS/PACK) IMPLANT
TRAY LAPAROSCOPIC MC (CUSTOM PROCEDURE TRAY) ×1 IMPLANT
TROCAR BALLN 12MMX100 BLUNT (TROCAR) ×1 IMPLANT
TROCAR Z-THREAD OPTICAL 5X100M (TROCAR) ×1 IMPLANT
WARMER LAPAROSCOPE (MISCELLANEOUS) ×1 IMPLANT
WATER STERILE IRR 1000ML POUR (IV SOLUTION) ×1 IMPLANT

## 2022-12-04 NOTE — H&P (Signed)
Lauren James is an 72 y.o. female.   Chief Complaint: abdominal pain, N/V HPI: 72yo F with PMHx as below woke up with abdominal pain yesterday AM.  The pain persisted and was associated with nausea and vomiting.  She came to the emergency department for evaluation.  CT scan of the abdomen and pelvis shows acute appendicitis with an appendicolith.  I was asked to see her for surgical management.  Past Medical History:  Diagnosis Date   Arthritis    Atypical chest pain 04/12/2017   Back pain    Diabetes mellitus type 2 in obese 04/12/2017   Dysuria    Frequency of urination    GERD (gastroesophageal reflux disease)    WATCHES DIET   Hematuria    History of kidney problems    History of kidney stones    Hyperlipidemia 04/12/2017   Hypertension    Nocturia    PONV (postoperative nausea and vomiting)    Prediabetes    DIET CONTROLLED   Right ureteral stone    Urgency of urination    Vitamin D deficiency    Wears glasses     Past Surgical History:  Procedure Laterality Date   ANTERIOR CERVICAL DECOMPRESSION/DISCECTOMY FUSION 4 LEVELS N/A 10/14/2020   Procedure: ANTERIOR CERVICAL DECOMPRESSION FUSION CERVICAL 4 - CERVICAL 5, CERVICAL 5 - CERVICAL 6, CERVICAL 6- CERVICAL 7;  Surgeon: Estill Bamberg, MD;  Location: [facility];  Service: Orthopedics;  Laterality: N/A;   CHOLECYSTECTOMY  2000  (APPROX)   CYSTOSCOPY W/ URETERAL STENT PLACEMENT Right 12/13/2012   Procedure: CYSTOSCOPY/RIGHT URETERAL STENT EXCHANGE/RIGHT RETROGRADE PYELOGRAM;  Surgeon: Crist Fat, MD;  [facility];  Service: Urology;  Laterality: Right;   CYSTOSCOPY WITH URETEROSCOPY Right 11/27/2012   Procedure: CYSTOSCOPY WITH RIGHT  URETEROSCOPY AND STENT PLACEMENT ;  Surgeon: Crecencio Mc, MD;  Location: [facility];  Service: Urology;  Laterality: Right;   EXTRACORPOREAL SHOCK WAVE LITHOTRIPSY  1994  (APPROX)   HOLMIUM LASER APPLICATION Right 11/27/2012   Procedure: HOLMIUM LASER APPLICATION;   Surgeon: Crecencio Mc, MD;  Location: [facility];  Service: Urology;  Laterality: Right;   HOLMIUM LASER APPLICATION Right 12/13/2012   Procedure: HOLMIUM LASER APPLICATION;  Surgeon: Crist Fat, MD;  Location: [facility];  Service: Urology;  Laterality: Right;   URETEROSCOPY Right 12/13/2012   Procedure: URETEROSCOPY;  Surgeon: Crist Fat, MD;  Location: [facility];  Service: Urology;  Laterality: Right;    Family History  Problem Relation Age of Onset   Diabetes Mother    Kidney disease Mother    Heart disease Mother    High blood pressure Mother    Stroke Mother    Obesity Mother    Dementia Father    Stroke Father    Heart attack Sister    Diabetes Sister    Diabetes Maternal Grandmother    Heart attack Maternal Grandfather    Alcoholism Paternal Grandfather    Diabetes Sister    Osteoporosis Sister    Social History:  reports that she has never smoked. She has never used smokeless tobacco. She reports that she does not drink alcohol and does not use drugs.  Allergies:  Allergies  Allergen Reactions   Adhesive [Tape] Itching and Other (See Comments)    BLISTERS, if worn for a long period of time    (Not in a hospital admission)   Results for orders placed or performed during the hospital encounter of 12/03/22 (from  the past 48 hour(s))  Basic metabolic panel     Status: Abnormal   Collection Time: 12/03/22  5:01 PM  Result Value Ref Range   Sodium 137 135 - 145 mmol/L   Potassium 4.5 3.5 - 5.1 mmol/L   Chloride 100 98 - 111 mmol/L   CO2 23 22 - 32 mmol/L   Glucose, Bld 138 (H) 70 - 99 mg/dL    Comment: Glucose reference range applies only to samples taken after fasting for at least 8 hours.   BUN 16 8 - 23 mg/dL   Creatinine, Ser 6.04 0.44 - 1.00 mg/dL   Calcium 9.4 8.9 - 54.0 mg/dL   GFR, Estimated >98 >11 mL/min    Comment: (NOTE) Calculated using the CKD-EPI Creatinine Equation (2021)    Anion gap 14 5 - 15     Comment: Performed at [facility], [address] [place], [place] [address]  CBC     Status: Abnormal   Collection Time: 12/03/22  5:01 PM  Result Value Ref Range   WBC 12.0 (H) 4.0 - 10.5 K/uL   RBC 5.00 3.87 - 5.11 MIL/uL   Hemoglobin 15.0 12.0 - 15.0 g/dL   HCT 29.5 62.1 - 30.8 %   MCV 89.6 80.0 - 100.0 fL   MCH 30.0 26.0 - 34.0 pg   MCHC 33.5 30.0 - 36.0 g/dL   RDW 65.7 84.6 - 96.2 %   Platelets 213 150 - 400 K/uL   nRBC 0.0 0.0 - 0.2 %    Comment: Performed at [facility], [address], [place], [place] [address]  Troponin I (High Sensitivity)     Status: None   Collection Time: 12/03/22  5:01 PM  Result Value Ref Range   Troponin I (High Sensitivity) 3 <18 ng/L    Comment: (NOTE) Elevated high sensitivity troponin I (hsTnI) values and significant  changes across serial measurements may suggest ACS but many other  chronic and acute conditions are known to elevate hsTnI results.  Refer to the "Links" section for chest pain algorithms and additional  guidance. Performed at [facility], [address], [place], [place] [address]   Troponin I (High Sensitivity)     Status: None   Collection Time: 12/03/22  8:01 PM  Result Value Ref Range   Troponin I (High Sensitivity) 3 <18 ng/L    Comment: (NOTE) Elevated high sensitivity troponin I (hsTnI) values and significant  changes across serial measurements may suggest ACS but many other  chronic and acute conditions are known to elevate hsTnI results.  Refer to the "Links" section for chest pain algorithms and additional  guidance. Performed at [facility], [address], [place], [place] [address]   POC CBG, ED     Status: Abnormal   Collection Time: 12/03/22  9:54 PM  Result Value Ref Range   Glucose-Capillary 153 (H) 70 - 99 mg/dL    Comment: Glucose reference range applies only to samples taken after fasting for at least 8 hours.   CT ABDOMEN PELVIS W CONTRAST  Result Date:  12/04/2022 CLINICAL DATA:  Abdominal pain, acute, nonlocalized he pt is c/o pain in her chest and her ribs bi-laterally no injury she also has nausea and vomiting no previous history EXAM: CT ABDOMEN AND PELVIS WITH CONTRAST TECHNIQUE: Multidetector CT imaging of the abdomen and pelvis was performed using the standard protocol following bolus administration of intravenous contrast. RADIATION DOSE REDUCTION: This exam was performed  according to the departmental dose-optimization program which includes automated exposure control, adjustment of the mA and/or kV according to patient size and/or use of iterative reconstruction technique. CONTRAST:  75mL OMNIPAQUE IOHEXOL 350 MG/ML SOLN COMPARISON:  CT abdomen pelvis 06/21/2017, CT abdomen pelvis 10/25/2020, CT abdomen pelvis 11/25/2012 FINDINGS: Lower chest: Tiny hiatal hernia. Hepatobiliary: No focal liver abnormality. Status post cholecystectomy. No biliary dilatation. Pancreas: No focal lesion. Normal pancreatic contour. No surrounding inflammatory changes. No main pancreatic ductal dilatation. Spleen: Few scattered subcentimeter hypodensities within the spleen. Normal in size. Adrenals/Urinary Tract: No adrenal nodule bilaterally. Bilateral kidneys enhance symmetrically. No hydronephrosis. No hydroureter. 6 mm calcified stone within the left kidney. No right nephrolithiasis. No ureterolithiasis bilaterally. The urinary bladder is unremarkable. On delayed imaging, there is no urothelial wall thickening and there are no filling defects in the opacified portions of the bilateral collecting systems or ureters. Stomach/Bowel: Stomach is within normal limits. No evidence of bowel wall thickening or dilatation. Colonic diverticulosis. The appendiceal caliber is enlarged in caliber measuring up to 1 cm with associated fluid-filled lumen. Appendicoliths are noted within the base and mid appendiceal lumen. The appendix is located medial and inferior to the cecum. The tip of the  appendix appears inseparable from the right adnexal region/uterus. Vascular/Lymphatic: No abdominal aorta or iliac aneurysm. Mild atherosclerotic plaque of the aorta and its branches. No abdominal, pelvic, or inguinal lymphadenopathy. Reproductive: Similar-appearing irregular uterus with multiple enhancing fibroids. (7:65). Uterus and bilateral adnexa are unremarkable. Other: No intraperitoneal free fluid. No intraperitoneal free gas. No organized fluid collection. Musculoskeletal: No abdominal wall hernia or abnormality. No suspicious lytic or blastic osseous lesions. No acute displaced fracture. IMPRESSION: 1. Non-perforated acute appendicitis with associated appendicolith. 2. Uterine fibroids. 3. Colonic diverticulosis with no acute diverticulitis. 4. Nonobstructive 6 mm left nephrolithiasis. 5. Small hiatal hernia. Electronically Signed   By: Tish Frederickson M.D.   On: 12/04/2022 00:55   DG Chest 1 View  Result Date: 12/03/2022 CLINICAL DATA:  Is syncopal episode. EXAM: CHEST  1 VIEW COMPARISON:  CT of the chest October 25, 2020 FINDINGS: Cardiomediastinal silhouette is normal. Mediastinal contours appear intact. There is no evidence of focal airspace consolidation, pleural effusion or pneumothorax. Osseous structures are without acute abnormality. Soft tissues are grossly normal. Cervical fusion noted. IMPRESSION: No active disease. Electronically Signed   By: Ted Mcalpine M.D.   On: 12/03/2022 18:09    Review of Systems  Constitutional:  Positive for appetite change.  HENT: Negative.    Eyes: Negative.   Respiratory: Negative.    Cardiovascular: Negative.   Gastrointestinal:  Positive for abdominal pain, nausea and vomiting.  Endocrine: Negative.   Genitourinary: Negative.   Musculoskeletal: Negative.   Skin: Negative.   Allergic/Immunologic: Negative.   Neurological: Negative.   Hematological: Negative.   Psychiatric/Behavioral: Negative.      Blood pressure (!) 150/88, pulse 75,  temperature 98 F (36.7 C), temperature source Oral, resp. rate (!) 23, height 5\' 4"  (1.626 m), weight 74 kg, SpO2 99%. Physical Exam HENT:     Head: Normocephalic.  Cardiovascular:     Rate and Rhythm: Normal rate and regular rhythm.  Pulmonary:     Effort: Pulmonary effort is normal.     Breath sounds: Normal breath sounds. No wheezing.  Abdominal:     General: Abdomen is flat.     Palpations: Abdomen is soft.     Tenderness: There is abdominal tenderness in the right lower quadrant. There is no guarding or rebound.  Skin:    General: Skin is warm.     Capillary Refill: Capillary refill takes 2 to 3 seconds.  Neurological:     Mental Status: She is alert and oriented to person, place, and time.  Psychiatric:        Mood and Affect: Mood normal.      Assessment/Plan Acute appendicitis -IV Zosyn has been started in the emergency department.  Admit and plan laparoscopic appendectomy later today by Dr. Freida Busman.  I discussed the procedure, risks, and benefits with her and her husband.  She is agreeable.  Liz Malady, MD 12/04/2022, 1:48 AM

## 2022-12-04 NOTE — Discharge Instructions (Signed)

## 2022-12-04 NOTE — Anesthesia Postprocedure Evaluation (Signed)
Anesthesia Post Note  Patient: Lauren James  Procedure(s) Performed: APPENDECTOMY LAPAROSCOPIC (Abdomen)     Patient location during evaluation: PACU Anesthesia Type: General Level of consciousness: awake and alert Pain management: pain level controlled Vital Signs Assessment: post-procedure vital signs reviewed and stable Respiratory status: spontaneous breathing, nonlabored ventilation and respiratory function stable Cardiovascular status: blood pressure returned to baseline and stable Postop Assessment: no apparent nausea or vomiting Anesthetic complications: no   No notable events documented.  Last Vitals:  Vitals:   12/04/22 1030 12/04/22 1045  BP: (!) 130/54 111/65  Pulse: 76 73  Resp: 13 11  Temp:  36.5 C  SpO2: 96% 95%    Last Pain:  Vitals:   12/04/22 1045  TempSrc:   PainSc: Asleep                 Lowella Curb

## 2022-12-04 NOTE — Op Note (Signed)
Date: 12/04/22  Patient: Lauren James MRN: [id number]  Preoperative Diagnosis: Acute appendicitis Postoperative Diagnosis: Same  Procedure: Laparoscopic appendectomy  Surgeon: Sophronia Simas, MD  EBL: Minimal  Anesthesia: General endotracheal  Specimens: Appendix  Indications: [name] is a 72 yo female who presented with acute onset lower abdominal pain. A CT scan showed acute appendicitis. After a discussion of the risks and benefits of surgery, she agreed to proceed with appendectomy.  Findings: Acute appendicitis without perforation or abscess.  Procedure details: Informed consent was obtained in the preoperative area prior to the procedure. The patient was brought to the operating room and placed on the table in the supine position. General anesthesia was induced and appropriate lines and drains were placed for intraoperative monitoring. Perioperative antibiotics were administered per SCIP guidelines. The abdomen was prepped and draped in the usual sterile fashion. A pre-procedure timeout was taken verifying patient identity, surgical site and procedure to be performed.  A small infraumbilical skin incision was made and the subcutaneous tissue was spread to expose the fascia. The umbilical stalk was grasped and elevated, and the fascia was sharply incised. The peritoneal cavity was visualized and a 12mm Hasson trocar was inserted. The abdomen was inspected with no evidence of visceral or vascular injury. A suprapubic 5mm port was placed, followed by a 5mm port in the LLQ, both under direct visualization. The appendix was identified in the RLQ and was inflamed and partially gangrenous. The tip of the appendix was adherent to the right pelvic sidewall. A mesenteric window was bluntly created at the base of the appendix, and the appendix was divided at the base from the cecum using a 45mm stapler with a blue load, taking a small portion of cecum with the appendix to ensure the staple  line was across healthy tissue. Care was taken not to injure the ileocecal valve. The mesoappendix was then divided with harmonic shears, starting at the base of the appendix and working towards the tip. The tip of the appendix was bluntly separated from the right pelvic sidewall. The right ovary and fallopian tube were visible and not injured during this dissection. The appendix was freed and placed in an Endocatch bag. The surgical site was irrigated. The cecal staple line was inspected and appeared in tact with no bleeding or leakage. The ports were removed and the pneumoperitoneum was evacuated. The specimen was extracted via the umbilical port site and sent for routine pathology. The umbilical port site fascia was closed with an 0 Vicryl suture. The skin at all port sites was closed with 4-0 monocryl subcuticular suture. Dermabond was applied.  The patient tolerated the procedure well with no apparent complications. All counts were correct x2 at the end of the procedure. The patient was extubated and taken to PACU in stable condition.  Sophronia Simas, MD 12/04/22 10:12 AM

## 2022-12-04 NOTE — Anesthesia Procedure Notes (Addendum)
Procedure Name: Intubation Date/Time: 12/04/2022 9:04 AM  Performed by: Pincus Large, CRNAPre-anesthesia Checklist: Patient identified, Emergency Drugs available, Suction available and Patient being monitored Patient Re-evaluated:Patient Re-evaluated prior to induction Oxygen Delivery Method: Circle System Utilized Preoxygenation: Pre-oxygenation with 100% oxygen Induction Type: IV induction Ventilation: Mask ventilation without difficulty Laryngoscope Size: Miller and 2 Grade View: Grade II Tube type: Oral Number of attempts: 1 Airway Equipment and Method: Stylet and Oral airway Placement Confirmation: ETT inserted through vocal cords under direct vision, positive ETCO2 and breath sounds checked- equal and bilateral Tube secured with: Tape Dental Injury: Teeth and Oropharynx as per pre-operative assessment

## 2022-12-04 NOTE — Discharge Summary (Signed)
    Patient ID: MODEST DUBICKI [id number] [birth year] 72 y.o.  Admit date: 12/03/2022 Discharge date: 12/04/2022  Admitting Diagnosis: appendicitis  Discharge Diagnosis Patient Active Problem List   Diagnosis Date Noted   Appendicitis 12/04/2022   Insulin resistance 04/25/2018   Class 1 obesity with serious comorbidity and body mass index (BMI) of 30.0 to 30.9 in adult 02/27/2018   Atypical chest pain 04/12/2017   Diabetes mellitus type 2 in obese 04/12/2017   Hyperlipidemia 04/12/2017    Consultants none  Reason for Admission: appendicitis  Procedures Lap appy, Dr. Freida Busman 12/04/22  Hospital Course:  The patient was admitted and underwent a laparoscopic appendectomy.  The patient tolerated the procedure well and was stable to DC home from the PACU.   Allergies as of 12/04/2022       Reactions   Adhesive [tape] Itching, Other (See Comments)   BLISTERS, if worn for a long period of time        Medication List     STOP taking these medications    meclizine 12.5 MG tablet Commonly known as: ANTIVERT       TAKE these medications    cholecalciferol 25 MCG (1000 UNIT) tablet Commonly known as: VITAMIN D3 Take 1,000 Units by mouth daily.   gabapentin 300 MG capsule Commonly known as: NEURONTIN Take 300 mg by mouth at bedtime.   glucose blood test strip Commonly known as: IGlucose Test Strips Twice daily (use Bayer brand or whichever ins covers)   Mounjaro 10 MG/0.5ML Pen Generic drug: tirzepatide Inject 10 mg into the skin once a week.   multivitamin with minerals Tabs tablet Take 1 tablet by mouth daily.   oxyCODONE 5 MG immediate release tablet Commonly known as: Oxy IR/ROXICODONE Take 1 tablet (5 mg total) by mouth every 6 (six) hours as needed for severe pain.   timolol 0.5 % ophthalmic solution Commonly known as: TIMOPTIC Place 1 drop into both eyes every morning.   valsartan 320 MG tablet Commonly known as: DIOVAN Take 320 mg by mouth daily.           Follow-up Information     Maczis, Hedda Slade, PA-C Follow up in 3 week(s).   Specialty: General Surgery Why: Office will call you with a follow up appointment, If you don't hear from the office, please call, Arrive 30 minutes prior to your appointment time, Please bring your insurance card and photo ID Contact information: [address] [place] [address] [facility] [place] [address] [phone]                 Signed: Barnetta Chapel, [facility] 12/04/2022, 12:23 PM Please see Amion for pager number during day hours 7:00am-4:30pm, 7-11:30am on Weekends

## 2022-12-04 NOTE — ED Notes (Signed)
Patient transported to CT 

## 2022-12-04 NOTE — ED Notes (Signed)
ED TO INPATIENT HANDOFF REPORT  ED Nurse Name and Phone #: [phone]  S Name/Age/Gender Lauren James 72 y.o. female Room/Bed: 044C/044C  Code Status   Code Status: Full Code  Home/SNF/Other Home Patient oriented to: self, place, time, and situation Is this baseline? Yes   Triage Complete: Triage complete  Chief Complaint Appendicitis [K37]  Triage Note The pt is c/o pain in her chest and her ribs bi-laterally  no injury  she also has nausea and vomiting no previous history   Allergies Allergies  Allergen Reactions   Adhesive [Tape] Itching and Other (See Comments)    BLISTERS, if worn for a long period of time    Level of Care/Admitting Diagnosis ED Disposition     ED Disposition  Admit   Condition  --   Comment  Hospital Area: [facility] [[id number]]  Level of Care: Med-Surg [16]  May place patient in observation at [facility] or [facility] if equivalent level of care is available:: No  Covid Evaluation: Asymptomatic - no recent exposure (last 10 days) testing not required  Diagnosis: Appendicitis [308657]  Admitting Physician: Violeta Gelinas [[id number]]  Attending Physician: CCS, MD [[id number]]  Bed request comments: 6N or 5N          B Medical/Surgery History Past Medical History:  Diagnosis Date   Arthritis    Atypical chest pain 04/12/2017   Back pain    Diabetes mellitus type 2 in obese 04/12/2017   Dysuria    Frequency of urination    GERD (gastroesophageal reflux disease)    WATCHES DIET   Hematuria    History of kidney problems    History of kidney stones    Hyperlipidemia 04/12/2017   Hypertension    Nocturia    PONV (postoperative nausea and vomiting)    Prediabetes    DIET CONTROLLED   Right ureteral stone    Urgency of urination    Vitamin D deficiency    Wears glasses    Past Surgical History:  Procedure Laterality Date   ANTERIOR CERVICAL DECOMPRESSION/DISCECTOMY FUSION 4 LEVELS N/A 10/14/2020   Procedure: ANTERIOR  CERVICAL DECOMPRESSION FUSION CERVICAL 4 - CERVICAL 5, CERVICAL 5 - CERVICAL 6, CERVICAL 6- CERVICAL 7;  Surgeon: Estill Bamberg, MD;  Location: [facility];  Service: Orthopedics;  Laterality: N/A;   CHOLECYSTECTOMY  2000  (APPROX)   CYSTOSCOPY W/ URETERAL STENT PLACEMENT Right 12/13/2012   Procedure: CYSTOSCOPY/RIGHT URETERAL STENT EXCHANGE/RIGHT RETROGRADE PYELOGRAM;  Surgeon: Crist Fat, MD;  Location: [facility];  Service: Urology;  Laterality: Right;   CYSTOSCOPY WITH URETEROSCOPY Right 11/27/2012   Procedure: CYSTOSCOPY WITH RIGHT  URETEROSCOPY AND STENT PLACEMENT ;  Surgeon: Crecencio Mc, MD;  Location: [facility];  Service: Urology;  Laterality: Right;   EXTRACORPOREAL SHOCK WAVE LITHOTRIPSY  1994  (APPROX)   HOLMIUM LASER APPLICATION Right 11/27/2012   Procedure: HOLMIUM LASER APPLICATION;  Surgeon: Crecencio Mc, MD;  Location: [facility];  Service: Urology;  Laterality: Right;   HOLMIUM LASER APPLICATION Right 12/13/2012   Procedure: HOLMIUM LASER APPLICATION;  Surgeon: Crist Fat, MD;  Location: [facility];  Service: Urology;  Laterality: Right;   URETEROSCOPY Right 12/13/2012   Procedure: URETEROSCOPY;  Surgeon: Crist Fat, MD;  Location: [facility];  Service: Urology;  Laterality: Right;     A IV Location/Drains/Wounds Patient Lines/Drains/Airways Status     Active Line/Drains/Airways     Name Placement date Placement time Site Days  Peripheral IV 12/03/22 20 G Anterior;Proximal;Right Forearm 12/03/22  2200  Forearm  1   Ureteral Drain/Stent Right ureter 6 Fr. 12/13/12  1601  Right ureter  3643            Intake/Output Last 24 hours No intake or output data in the 24 hours ending 12/04/22 0720  Labs/Imaging Results for orders placed or performed during the hospital encounter of 12/03/22 (from the past 48 hour(s))  Basic metabolic panel     Status: Abnormal   Collection Time: 12/03/22  5:01 PM  Result Value Ref Range    Sodium 137 135 - 145 mmol/L   Potassium 4.5 3.5 - 5.1 mmol/L   Chloride 100 98 - 111 mmol/L   CO2 23 22 - 32 mmol/L   Glucose, Bld 138 (H) 70 - 99 mg/dL    Comment: Glucose reference range applies only to samples taken after fasting for at least 8 hours.   BUN 16 8 - 23 mg/dL   Creatinine, Ser 7.82 0.44 - 1.00 mg/dL   Calcium 9.4 8.9 - 95.6 mg/dL   GFR, Estimated >21 >30 mL/min    Comment: (NOTE) Calculated using the CKD-EPI Creatinine Equation (2021)    Anion gap 14 5 - 15    Comment: Performed at [facility], [address] [place], [place] [address]  CBC     Status: Abnormal   Collection Time: 12/03/22  5:01 PM  Result Value Ref Range   WBC 12.0 (H) 4.0 - 10.5 K/uL   RBC 5.00 3.87 - 5.11 MIL/uL   Hemoglobin 15.0 12.0 - 15.0 g/dL   HCT 46.9 62.9 - 52.8 %   MCV 89.6 80.0 - 100.0 fL   MCH 30.0 26.0 - 34.0 pg   MCHC 33.5 30.0 - 36.0 g/dL   RDW 41.3 24.4 - 01.0 %   Platelets 213 150 - 400 K/uL   nRBC 0.0 0.0 - 0.2 %    Comment: Performed at [facility], [address], [place], [place] [address]  Troponin I (High Sensitivity)     Status: None   Collection Time: 12/03/22  5:01 PM  Result Value Ref Range   Troponin I (High Sensitivity) 3 <18 ng/L    Comment: (NOTE) Elevated high sensitivity troponin I (hsTnI) values and significant  changes across serial measurements may suggest ACS but many other  chronic and acute conditions are known to elevate hsTnI results.  Refer to the "Links" section for chest pain algorithms and additional  guidance. Performed at [facility], [address], [place], [place] [address]   Troponin I (High Sensitivity)     Status: None   Collection Time: 12/03/22  8:01 PM  Result Value Ref Range   Troponin I (High Sensitivity) 3 <18 ng/L    Comment: (NOTE) Elevated high sensitivity troponin I (hsTnI) values and significant  changes across serial measurements may suggest ACS but many other  chronic and acute conditions are  known to elevate hsTnI results.  Refer to the "Links" section for chest pain algorithms and additional  guidance. Performed at [facility], [address], [place], [place] [address]   Lipase, blood     Status: None   Collection Time: 12/03/22  8:01 PM  Result Value Ref Range   Lipase 39 11 - 51 U/L    Comment: Performed at [facility], [address], [place], [place] [address]  Hepatic function panel  Status: None   Collection Time: 12/03/22  8:01 PM  Result Value Ref Range   Total Protein 7.8 6.5 - 8.1 g/dL   Albumin 4.2 3.5 - 5.0 g/dL   AST 27 15 - 41 U/L   ALT 31 0 - 44 U/L   Alkaline Phosphatase 56 38 - 126 U/L   Total Bilirubin 0.7 0.3 - 1.2 mg/dL   Bilirubin, Direct <1.6 0.0 - 0.2 mg/dL   Indirect Bilirubin NOT CALCULATED 0.3 - 0.9 mg/dL    Comment: Performed at [facility], [address], [place], [place] [address]  POC CBG, ED     Status: Abnormal   Collection Time: 12/03/22  9:54 PM  Result Value Ref Range   Glucose-Capillary 153 (H) 70 - 99 mg/dL    Comment: Glucose reference range applies only to samples taken after fasting for at least 8 hours.   CT ABDOMEN PELVIS W CONTRAST  Result Date: 12/04/2022 CLINICAL DATA:  Abdominal pain, acute, nonlocalized he pt is c/o pain in her chest and her ribs bi-laterally no injury she also has nausea and vomiting no previous history EXAM: CT ABDOMEN AND PELVIS WITH CONTRAST TECHNIQUE: Multidetector CT imaging of the abdomen and pelvis was performed using the standard protocol following bolus administration of intravenous contrast. RADIATION DOSE REDUCTION: This exam was performed according to the departmental dose-optimization program which includes automated exposure control, adjustment of the mA and/or kV according to patient size and/or use of iterative reconstruction technique. CONTRAST:  75mL OMNIPAQUE IOHEXOL 350 MG/ML SOLN COMPARISON:  CT abdomen pelvis 06/21/2017, CT abdomen pelvis 10/25/2020, CT abdomen  pelvis 11/25/2012 FINDINGS: Lower chest: Tiny hiatal hernia. Hepatobiliary: No focal liver abnormality. Status post cholecystectomy. No biliary dilatation. Pancreas: No focal lesion. Normal pancreatic contour. No surrounding inflammatory changes. No main pancreatic ductal dilatation. Spleen: Few scattered subcentimeter hypodensities within the spleen. Normal in size. Adrenals/Urinary Tract: No adrenal nodule bilaterally. Bilateral kidneys enhance symmetrically. No hydronephrosis. No hydroureter. 6 mm calcified stone within the left kidney. No right nephrolithiasis. No ureterolithiasis bilaterally. The urinary bladder is unremarkable. On delayed imaging, there is no urothelial wall thickening and there are no filling defects in the opacified portions of the bilateral collecting systems or ureters. Stomach/Bowel: Stomach is within normal limits. No evidence of bowel wall thickening or dilatation. Colonic diverticulosis. The appendiceal caliber is enlarged in caliber measuring up to 1 cm with associated fluid-filled lumen. Appendicoliths are noted within the base and mid appendiceal lumen. The appendix is located medial and inferior to the cecum. The tip of the appendix appears inseparable from the right adnexal region/uterus. Vascular/Lymphatic: No abdominal aorta or iliac aneurysm. Mild atherosclerotic plaque of the aorta and its branches. No abdominal, pelvic, or inguinal lymphadenopathy. Reproductive: Similar-appearing irregular uterus with multiple enhancing fibroids. (7:65). Uterus and bilateral adnexa are unremarkable. Other: No intraperitoneal free fluid. No intraperitoneal free gas. No organized fluid collection. Musculoskeletal: No abdominal wall hernia or abnormality. No suspicious lytic or blastic osseous lesions. No acute displaced fracture. IMPRESSION: 1. Non-perforated acute appendicitis with associated appendicolith. 2. Uterine fibroids. 3. Colonic diverticulosis with no acute diverticulitis. 4.  Nonobstructive 6 mm left nephrolithiasis. 5. Small hiatal hernia. Electronically Signed   By: Tish Frederickson M.D.   On: 12/04/2022 00:55   DG Chest 1 View  Result Date: 12/03/2022 CLINICAL DATA:  Is syncopal episode. EXAM: CHEST  1 VIEW COMPARISON:  CT of the chest October 25, 2020 FINDINGS: Cardiomediastinal silhouette is normal. Mediastinal contours appear intact. There is no evidence  of focal airspace consolidation, pleural effusion or pneumothorax. Osseous structures are without acute abnormality. Soft tissues are grossly normal. Cervical fusion noted. IMPRESSION: No active disease. Electronically Signed   By: Ted Mcalpine M.D.   On: 12/03/2022 18:09    Pending Labs Unresulted Labs (From admission, onward)     Start     Ordered   12/03/22 2212  Urinalysis, Routine w reflex microscopic -Urine, Clean Catch  Once,   URGENT       Question:  Specimen Source  Answer:  Urine, Clean Catch   12/03/22 2211   Signed and Held  Basic metabolic panel  Tomorrow morning,   R        Signed and Held   Signed and Held  CBC  Tomorrow morning,   R        Signed and Held   Signed and Held  Hemoglobin A1c  Once,   R       Comments: To assess prior glycemic control    Signed and Held            Vitals/Pain Today's Vitals   12/04/22 0545 12/04/22 0600 12/04/22 0615 12/04/22 0701  BP: (!) 116/52 (!) 122/56 (!) 129/57   Pulse: 73 75 74   Resp: 14 12 14    Temp:    98.2 F (36.8 C)  TempSrc:    Oral  SpO2: 96% 98% 96%   Weight:      Height:      PainSc:        Isolation Precautions No active isolations  Medications Medications  ondansetron (ZOFRAN-ODT) disintegrating tablet 8 mg (8 mg Oral Given 12/03/22 1700)  lactated ringers bolus 1,000 mL (0 mLs Intravenous Stopped 12/04/22 0031)  promethazine (PHENERGAN) 12.5 mg in sodium chloride 0.9 % 50 mL IVPB (0 mg Intravenous Stopped 12/04/22 0031)  fentaNYL (SUBLIMAZE) injection 50 mcg (50 mcg Intravenous Given 12/03/22 2202)  iohexol (OMNIPAQUE)  350 MG/ML injection 75 mL (75 mLs Intravenous Contrast Given 12/04/22 0047)  piperacillin-tazobactam (ZOSYN) IVPB 3.375 g (0 g Intravenous Stopped 12/04/22 0201)  alum & mag hydroxide-simeth (MAALOX/MYLANTA) 200-200-20 MG/5ML suspension 30 mL (30 mLs Oral Given 12/04/22 0119)  HYDROmorphone (DILAUDID) injection 1 mg (1 mg Intravenous Given 12/04/22 0211)  LORazepam (ATIVAN) injection 0.5 mg (0.5 mg Intravenous Given 12/04/22 0211)    Mobility walks     Focused Assessments    R Recommendations: See Admitting Provider Note  Report given to:   Additional Notes:

## 2022-12-04 NOTE — Interval H&P Note (Signed)
History and Physical Interval Note:  12/04/2022 8:12 AM  Lauren James  has presented today for surgery, with the diagnosis of APPENDICITIS.  The various methods of treatment have been discussed with the patient and family. After consideration of risks, benefits and other options for treatment, the patient has consented to  Procedure(s): APPENDECTOMY LAPAROSCOPIC (N/A) as a surgical intervention.  The patient's history has been reviewed, patient examined, no change in status, stable for surgery.  I have reviewed the patient's chart and labs.  Questions were answered to the patient's satisfaction. Plan for discharge home postoperatively.   Fritzi Mandes

## 2022-12-04 NOTE — ED Provider Notes (Signed)
2:53 AM Patient admission orders placed by general surgery. VSS.   Antony Madura, PA-C 12/04/22 0254    Zadie Rhine, MD 12/04/22 [phone]

## 2022-12-04 NOTE — Addendum Note (Signed)
Addendum  created 12/04/22 1150 by Einar Crow, Lorrain Rivers, Producer, television/film/video recorded in [place], [employer] filed

## 2022-12-04 NOTE — Anesthesia Preprocedure Evaluation (Signed)
Anesthesia Evaluation  Patient identified by MRN, date of birth, ID band Patient awake    Reviewed: Allergy & Precautions, NPO status , Patient's Chart, lab work & pertinent test results  History of Anesthesia Complications (+) PONV and history of anesthetic complications  Airway Mallampati: III  TM Distance: >3 FB Neck ROM: Full    Dental no notable dental hx. (+) Teeth Intact, Dental Advisory Given,    Pulmonary neg pulmonary ROS   Pulmonary exam normal breath sounds clear to auscultation       Cardiovascular hypertension, Pt. on medications Normal cardiovascular exam Rhythm:Regular Rate:Normal  EKG: 10/11/20: NSR   CV: Nuclear stress test 04/12/17: The left ventricular ejection fraction is hyperdynamic (>65%). Nuclear stress EF: 73%. No T wave inversion was noted during stress. The study is normal. This is a low risk study.  Normal perfusion. LVEF 73% with normal wall motion. This is a low risk study.   Neuro/Psych MRI C-spine 08/30/20 (report in [facility]/PACS): IMPRESSION: 1. Since 2019 multifactorial cervical spinal stenosis has developed at C5-C6 and is mild-to-moderate along with cord mass effect. This is secondary to both increased ligament flavum hypertrophy and disc degeneration. No spinal cord signal abnormality is identified. Up to moderate left C6 foraminal stenosis appears stable. 2. Ligament flavum hypertrophy has also increased at C6-C7, but that level is otherwise stable with no significant spinal stenosis and unchanged mild to moderate left foraminal stenosis. 3. Mild cervical spine degeneration elsewhere is stable. == negative neurological ROS  negative psych ROS   GI/Hepatic Neg liver ROS,GERD  Poorly Controlled,,  Endo/Other  diabetes, Type 2, Oral Hypoglycemic Agents    Renal/GU negative Renal ROS  negative genitourinary   Musculoskeletal  (+) Arthritis ,    Abdominal   Peds negative  pediatric ROS (+)  Hematology negative hematology ROS (+)   Anesthesia Other Findings   Reproductive/Obstetrics negative OB ROS                             Anesthesia Physical Anesthesia Plan  ASA: 3  Anesthesia Plan: General   Post-op Pain Management:    Induction: Intravenous  PONV Risk Score and Plan: 4 or greater and Ondansetron, Dexamethasone, Treatment may vary due to age or medical condition, Midazolam and Droperidol  Airway Management Planned: Oral ETT  Additional Equipment: None  Intra-op Plan:   Post-operative Plan: Extubation in OR  Informed Consent: I have reviewed the patients History and Physical, chart, labs and discussed the procedure including the risks, benefits and alternatives for the proposed anesthesia with the patient or authorized representative who has indicated his/her understanding and acceptance.     Dental advisory given  Plan Discussed with: CRNA and Anesthesiologist  Anesthesia Plan Comments: (PAT note written 10/12/2020 by Shonna Chock, PA-C. )        Anesthesia Quick Evaluation

## 2022-12-04 NOTE — Transfer of Care (Signed)
Immediate Anesthesia Transfer of Care Note  Patient: Lauren James  Procedure(s) Performed: APPENDECTOMY LAPAROSCOPIC (Abdomen)  Patient Location: PACU  Anesthesia Type:General  Level of Consciousness: sedated and drowsy  Airway & Oxygen Therapy: Patient Spontanous Breathing and Patient connected to face mask oxygen  Post-op Assessment: Report given to RN and Post -op Vital signs reviewed and stable  Post vital signs: Reviewed and stable  Last Vitals:  Vitals Value Taken Time  BP 130/56 12/04/22 1009  Temp 36.5 C 12/04/22 1009  Pulse 79 12/04/22 1013  Resp 16 12/04/22 1013  SpO2 100 % 12/04/22 1013  Vitals shown include unfiled device data.  Last Pain:  Vitals:   12/04/22 1009  TempSrc:   PainSc: Asleep      Patients Stated Pain Goal: 0 (12/04/22 0843)  Complications: No notable events documented.

## 2022-12-05 ENCOUNTER — Encounter (HOSPITAL_COMMUNITY): Payer: Self-pay | Admitting: Surgery

## 2022-12-05 LAB — SURGICAL PATHOLOGY

## 2022-12-05 LAB — GLUCOSE, CAPILLARY: Glucose-Capillary: 102 mg/dL — ABNORMAL HIGH (ref 70–99)

## 2022-12-06 DIAGNOSIS — E1169 Type 2 diabetes mellitus with other specified complication: Secondary | ICD-10-CM | POA: Diagnosis not present

## 2022-12-06 DIAGNOSIS — Z6823 Body mass index (BMI) 23.0-23.9, adult: Secondary | ICD-10-CM | POA: Diagnosis not present

## 2022-12-06 DIAGNOSIS — M542 Cervicalgia: Secondary | ICD-10-CM | POA: Diagnosis not present

## 2022-12-06 DIAGNOSIS — Z8639 Personal history of other endocrine, nutritional and metabolic disease: Secondary | ICD-10-CM | POA: Diagnosis not present

## 2022-12-06 DIAGNOSIS — Z9049 Acquired absence of other specified parts of digestive tract: Secondary | ICD-10-CM | POA: Diagnosis not present

## 2022-12-18 DIAGNOSIS — H2513 Age-related nuclear cataract, bilateral: Secondary | ICD-10-CM | POA: Diagnosis not present

## 2022-12-18 DIAGNOSIS — H40023 Open angle with borderline findings, high risk, bilateral: Secondary | ICD-10-CM | POA: Diagnosis not present

## 2022-12-28 DIAGNOSIS — Z8639 Personal history of other endocrine, nutritional and metabolic disease: Secondary | ICD-10-CM | POA: Diagnosis not present

## 2022-12-28 DIAGNOSIS — R2689 Other abnormalities of gait and mobility: Secondary | ICD-10-CM | POA: Diagnosis not present

## 2022-12-28 DIAGNOSIS — E1169 Type 2 diabetes mellitus with other specified complication: Secondary | ICD-10-CM | POA: Diagnosis not present

## 2022-12-28 DIAGNOSIS — K760 Fatty (change of) liver, not elsewhere classified: Secondary | ICD-10-CM | POA: Diagnosis not present

## 2022-12-28 DIAGNOSIS — Z6823 Body mass index (BMI) 23.0-23.9, adult: Secondary | ICD-10-CM | POA: Diagnosis not present

## 2023-01-16 DIAGNOSIS — R29818 Other symptoms and signs involving the nervous system: Secondary | ICD-10-CM | POA: Diagnosis not present

## 2023-01-16 DIAGNOSIS — G720 Drug-induced myopathy: Secondary | ICD-10-CM | POA: Diagnosis not present

## 2023-01-16 DIAGNOSIS — I700 Atherosclerosis of aorta: Secondary | ICD-10-CM | POA: Diagnosis not present

## 2023-01-16 DIAGNOSIS — Z23 Encounter for immunization: Secondary | ICD-10-CM | POA: Diagnosis not present

## 2023-01-16 DIAGNOSIS — M5030 Other cervical disc degeneration, unspecified cervical region: Secondary | ICD-10-CM | POA: Diagnosis not present

## 2023-01-16 DIAGNOSIS — E1169 Type 2 diabetes mellitus with other specified complication: Secondary | ICD-10-CM | POA: Diagnosis not present

## 2023-01-16 DIAGNOSIS — E785 Hyperlipidemia, unspecified: Secondary | ICD-10-CM | POA: Diagnosis not present

## 2023-01-16 DIAGNOSIS — I10 Essential (primary) hypertension: Secondary | ICD-10-CM | POA: Diagnosis not present

## 2023-01-16 DIAGNOSIS — E119 Type 2 diabetes mellitus without complications: Secondary | ICD-10-CM | POA: Diagnosis not present

## 2023-01-23 DIAGNOSIS — N200 Calculus of kidney: Secondary | ICD-10-CM | POA: Diagnosis not present

## 2023-01-24 ENCOUNTER — Other Ambulatory Visit: Payer: Self-pay | Admitting: Urology

## 2023-01-24 ENCOUNTER — Encounter (HOSPITAL_BASED_OUTPATIENT_CLINIC_OR_DEPARTMENT_OTHER): Payer: Self-pay | Admitting: Urology

## 2023-01-24 NOTE — Progress Notes (Signed)
Pre-op phone call complete. Procedure date and arrival time confirmed (01/26/2023 at 0915). Patient allergies, medications, and medical history verified. Patient to continue to hold Monjuro until after procedure and vitamins. NPO at midnight of food and drink. Denies recent history of chest pain and COVID. Patient verbalized understanding. Driver secured.

## 2023-01-24 NOTE — H&P (Signed)
Left 6 mm stone   Patient presents today for further discussion of a nonobstructing left-sided 6 mm nonobstructing ureteral stone. She was seen and treated for appendicitis in August. At that time she had a CT scan that demonstrated nonobstructing stone in the left kidney. She is here for further evaluation and management. I treated this patient for right sided ureteral stones in 2014. She did well from that. She had ureteroscopy. She is also had shockwave lithotripsy in the past and done well with that.   The patient is diabetic, takes Mounjaro, but is otherwise relatively healthy with no significant comorbidities. She tolerated the appendectomy without issue. She recovered well.     ALLERGIES: No Allergies    MEDICATIONS: Gabapentin  Latanoprost 0.005 % drops  Magnesium  Mounjaro  Valsartan  Vitamin B12  Vitamin D2 1,250 mcg (50,000 unit) capsule Oral     GU PSH: Cysto Uretero Lithotripsy - 2014, 2014 Cystoscopy Insert Stent - 2014, 2014       PSH Notes: Cystoscopy With Ureteroscopy With Lithotripsy, Cystoscopy With Insertion Of Ureteral Stent Right, Cystoscopy With Insertion Of Ureteral Stent Right, Cystoscopy With Ureteroscopy With Lithotripsy, Cholecystectomy, neck surgery (2023)   NON-GU PSH: Cholecystectomy (open) - 2014     GU PMH: Renal calculus, Nephrolithiasis - 2014      PMH Notes:  2012-11-27 12:18:58 - Note: Urinary Calculus   NON-GU PMH: Encounter for general adult medical examination without abnormal findings, Encounter for preventive health examination - 2014 Anxiety, Anxiety (Symptom) - 2014 Personal history of other diseases of the digestive system, History of esophageal reflux - 2014 Personal history of other endocrine, nutritional and metabolic disease, History of diabetes mellitus - 2014, History of diabetes mellitus, - 2014 Personal history of other mental and behavioral disorders, History of depression - 2014 Diabetes Type 2 Glaucoma Hypertension     FAMILY HISTORY: Death In The Family Father - Runs In Family Death In The Family Mother - Runs In Family Dementia - Father Diabetes - Mother Hematuria - Runs In Family Hypertension - Mother Kidney Failure - Mother nephrolithiasis - Father Prostate Cancer - Runs In Family stroke - Father   SOCIAL HISTORY: Marital Status: Unknown Preferred Language: English; Ethnicity: Not Hispanic Or Latino; Race: White Current Smoking Status: Patient has never smoked.   Tobacco Use Assessment Completed: Used Tobacco in last 30 days? Has never drank.  Drinks 2 caffeinated drinks per day. Patient's occupation is/was Retired.     Notes: Alcohol Use, Occupation:, Marital History - Currently Married, Caffeine Use, Never A Smoker   REVIEW OF SYSTEMS:    GU Review Female:   Patient reports hard to postpone urination, get up at night to urinate, and stream starts and stops. Patient denies frequent urination, burning /pain with urination, leakage of urine, trouble starting your stream, have to strain to urinate, and being pregnant.  Gastrointestinal (Upper):   Patient denies nausea, vomiting, and indigestion/ heartburn.  Gastrointestinal (Lower):   Patient denies diarrhea and constipation.  Constitutional:   Patient reports night sweats, weight loss, and fatigue. Patient denies fever.  Skin:   Patient denies skin rash/ lesion and itching.  Eyes:   Patient reports blurred vision. Patient denies double vision.  Ears/ Nose/ Throat:   Patient reports sinus problems. Patient denies sore throat.  Hematologic/Lymphatic:   Patient denies swollen glands and easy bruising.  Cardiovascular:   Patient denies leg swelling and chest pains.  Respiratory:   Patient denies shortness of breath and cough.  Endocrine:  Patient denies excessive thirst.  Musculoskeletal:   Patient reports back pain. Patient denies joint pain.  Neurological:   Patient reports dizziness. Patient denies headaches.  Psychologic:   Patient  denies depression and anxiety.   VITAL SIGNS:      01/23/2023 02:28 PM  Weight 142 lb / 64.41 kg  BP 104/66 mmHg  Pulse 74 /min   MULTI-SYSTEM PHYSICAL EXAMINATION:    Constitutional: Well-nourished. No physical deformities. Normally developed. Good grooming.  Neck: Neck symmetrical, not swollen. Normal tracheal position.  Respiratory: Normal breath sounds. No labored breathing, no use of accessory muscles.   Cardiovascular: Regular rate and rhythm. No murmur, no gallop. Normal temperature, normal extremity pulses, no swelling, no varicosities.   Lymphatic: No enlargement of neck, axillae, groin.  Skin: No paleness, no jaundice, no cyanosis. No lesion, no ulcer, no rash.  Neurologic / Psychiatric: Oriented to time, oriented to place, oriented to person. No depression, no anxiety, no agitation.  Gastrointestinal: No mass, no tenderness, no rigidity, non obese abdomen.  Eyes: Normal conjunctivae. Normal eyelids.  Ears, Nose, Mouth, and Throat: Left ear no scars, no lesions, no masses. Right ear no scars, no lesions, no masses. Nose no scars, no lesions, no masses. Normal hearing. Normal lips.  Musculoskeletal: Normal gait and station of head and neck.     Complexity of Data:  Source Of History:  Patient  Records Review:   Previous Doctor Records, Previous Patient Records, POC Tool  Urine Test Review:   Urinalysis  X-Ray Review: KUB: Reviewed Films. Discussed With Patient.  C.T. Abdomen/Pelvis: Reviewed Films. Discussed With Patient.     PROCEDURES:         KUB - [id number]  A single view of the abdomen is obtained. Renal shadows are easily visualized bilaterally. There is a 6 mm opacification in the left lower pole consistent with the patient's known stone. There are no additional calcifications along the expected location of either ureter bilaterally.  Gas pattern is grossly normal. No significant bony abnormalities.      Impression: The patient has an easily visualized stone in the  left lower pole measuring approximately 6 mm.         Urinalysis w/Scope Dipstick Dipstick Cont'd Micro  Color: Yellow Bilirubin: Neg mg/dL WBC/hpf: 0 - 5/hpf  Appearance: Clear Ketones: Neg mg/dL RBC/hpf: 10 - 16/XWR  Specific Gravity: 1.015 Blood: 2+ ery/uL Bacteria: Few (10-25/hpf)  pH: 5.5 Protein: Neg mg/dL Cystals: NS (Not Seen)  Glucose: Neg mg/dL Urobilinogen: 0.2 mg/dL Casts: NS (Not Seen)    Nitrites: Neg Trichomonas: Not Present    Leukocyte Esterase: 1+ leu/uL Mucous: Not Present      Epithelial Cells: 0 - 5/hpf      Yeast: NS (Not Seen)      Sperm: Not Present    ASSESSMENT:      ICD-10 Details  1 GU:   Renal calculus - N20.0    PLAN:           Orders X-Rays: KUB          Schedule         Document Letter(s):  Created for Patient: Clinical Summary         Notes:   The patient's stone is easily visualized on today's KUB. We discussed management strategies for her, and she is anxious to get the stone taken care of to minimize the risk of emergent trips to the emergency department. She is had both ureteroscopy and shockwave lithotripsy in the past.  The stones quite easy to see, and I think she would be well served with shockwave lithotripsy. We did discuss the risks and the benefits of it. I went through it all in detail with her. At the end, she opted to proceed. Will try to get her scheduled ASAP.

## 2023-01-25 ENCOUNTER — Ambulatory Visit: Payer: PPO | Admitting: Physical Therapy

## 2023-01-25 ENCOUNTER — Encounter: Payer: Self-pay | Admitting: Physical Therapy

## 2023-01-25 VITALS — BP 136/71 | HR 63

## 2023-01-25 DIAGNOSIS — R2681 Unsteadiness on feet: Secondary | ICD-10-CM

## 2023-01-25 NOTE — Therapy (Signed)
OUTPATIENT PHYSICAL THERAPY PHYSICAL THERAPY EVALUATION - ARRIVED NO CHARGE     Patient Name: Lauren James MRN: [id number] DOB:1951/03/08, 72 y.o., female Today's Date: 01/25/2023  END OF SESSION:  PT End of Session - 01/25/23 0857     Visit Number 1    PT Start Time 0853   started eval late   PT Stop Time 0930    PT Time Calculation (min) 37 min    Equipment Utilized During Treatment Gait belt    Activity Tolerance Patient tolerated treatment well    Behavior During Therapy WFL for tasks assessed/performed             Past Medical History:  Diagnosis Date   Arthritis    Atypical chest pain 04/12/2017   Back pain    Diabetes mellitus type 2 in obese 04/12/2017   Dysuria    Frequency of urination    GERD (gastroesophageal reflux disease)    WATCHES DIET   Hematuria    History of kidney problems    History of kidney stones    Hyperlipidemia 04/12/2017   Hypertension    Nocturia    PONV (postoperative nausea and vomiting)    Prediabetes    DIET CONTROLLED   Right ureteral stone    Urgency of urination    Vitamin D deficiency    Wears glasses    Past Surgical History:  Procedure Laterality Date   ANTERIOR CERVICAL DECOMPRESSION/DISCECTOMY FUSION 4 LEVELS N/A 10/14/2020   Procedure: ANTERIOR CERVICAL DECOMPRESSION FUSION CERVICAL 4 - CERVICAL 5, CERVICAL 5 - CERVICAL 6, CERVICAL 6- CERVICAL 7;  Surgeon: Estill Bamberg, MD;  Location: [facility];  Service: Orthopedics;  Laterality: N/A;   CHOLECYSTECTOMY  2000  (APPROX)   CYSTOSCOPY W/ URETERAL STENT PLACEMENT Right 12/13/2012   Procedure: CYSTOSCOPY/RIGHT URETERAL STENT EXCHANGE/RIGHT RETROGRADE PYELOGRAM;  Surgeon: Crist Fat, MD;  Location: [facility];  Service: Urology;  Laterality: Right;   CYSTOSCOPY WITH URETEROSCOPY Right 11/27/2012   Procedure: CYSTOSCOPY WITH RIGHT  URETEROSCOPY AND STENT PLACEMENT ;  Surgeon: Crecencio Mc, MD;  Location: [facility];  Service: Urology;  Laterality: Right;    EXTRACORPOREAL SHOCK WAVE LITHOTRIPSY  1994  (APPROX)   HOLMIUM LASER APPLICATION Right 11/27/2012   Procedure: HOLMIUM LASER APPLICATION;  Surgeon: Crecencio Mc, MD;  Location: [facility];  Service: Urology;  Laterality: Right;   HOLMIUM LASER APPLICATION Right 12/13/2012   Procedure: HOLMIUM LASER APPLICATION;  Surgeon: Crist Fat, MD;  Location: [facility];  Service: Urology;  Laterality: Right;   LAPAROSCOPIC APPENDECTOMY N/A 12/04/2022   Procedure: APPENDECTOMY LAPAROSCOPIC;  Surgeon: Fritzi Mandes, MD;  Location: [facility];  Service: General;  Laterality: N/A;   URETEROSCOPY Right 12/13/2012   Procedure: URETEROSCOPY;  Surgeon: Crist Fat, MD;  Location: [facility];  Service: Urology;  Laterality: Right;   Patient Active Problem List   Diagnosis Date Noted   Appendicitis 12/04/2022   Insulin resistance 04/25/2018   Class 1 obesity with serious comorbidity and body mass index (BMI) of 30.0 to 30.9 in adult 02/27/2018   Atypical chest pain 04/12/2017   Type 2 diabetes mellitus with obesity (HCC) 04/12/2017   Hyperlipidemia 04/12/2017    PCP:  Laurann Montana, MD   REFERRING PROVIDER:  Helane Rima, DO  REFERRING DIAG: R26.89 (ICD-10-CM) - Other abnormalities of gait and mobility   THERAPY DIAG:  Unsteadiness on feet  ONSET DATE: 01/03/2023  Rationale for Evaluation and Treatment: Rehabilitation  SUBJECTIVE:  SUBJECTIVE STATEMENT: Had a fall in the beginning of October. Has pain in her neck and pain. Was exercising and kept walking until the pain was severe. When she went to sit down on the bench, she knew she was falling, but was unable to stop it. Felt numb when this happened. Was able to get up ok. Has been going to PT for her neck/back and they had to cancel it when she got her appendix taken out. Feels like she lost what she gained with her therapy for her neck and back. Also reports a tingling sensation everywhere. Wakes up every  morning for with a rocking. Has noticed balance issues >1 year, notes that it comes and goes. Has stumbles like she feel like she could fall. Walking daily for exercise.   Pt accompanied by: self  PERTINENT HISTORY: PMH: Type 2 diabetes,HTN, hearing loss, HLD,  underwent emergent laparoscopic appendectomy on 12/04/22, ACDF in July 2022, glaucoma, hx of migraines   PAIN:  Are you having pain? Yes: NPRS scale: 3/10 In mid back area   PRECAUTIONS: Fall  WEIGHT BEARING RESTRICTIONS: No  FALLS: Has patient fallen in last 6 months? Yes. Number of falls 1  LIVING ENVIRONMENT: Lives with: lives with their spouse Lives in: House/apartment Stairs: Yes: Internal: 14 steps; on right going up and External: 2 steps; on right going up and wall on the other side  Has following equipment at home: Single point cane and has one but does not use it   PLOF: Independent  PATIENT GOALS: Wants to get rid of the pain, wants to stop falling.   OBJECTIVE:  Note: Objective measures were completed at Evaluation unless otherwise noted.  COGNITION: Overall cognitive status: Within functional limits for tasks assessed    LOWER EXTREMITY MMT:    MMT Right Eval Left Eval  Hip flexion 4 4  Hip extension    Hip abduction 5 5  Hip adduction 4+ 4+  Hip internal rotation    Hip external rotation    Knee flexion 5 5  Knee extension 4+ 4+  Ankle dorsiflexion 5 5  Ankle plantarflexion    Ankle inversion    Ankle eversion    (Blank rows = not tested)  All tested in sitting   TRANSFERS: Assistive device utilized: None  Sit to stand: Complete Independence Stand to sit: Complete Independence  GAIT: Gait pattern: WFL and step through pattern Distance walked: Clinic distances  Assistive device utilized: None Level of assistance: SBA Comments: Unsteadiness just noted with head motions   FUNCTIONAL TESTS:  5 times sit to stand: 13.9 seconds 10 meter walk test: 12.6 seconds = 2.60 ft/sec      M-CTSIB  Condition 1: Firm Surface, EO 30 Sec, Normal Sway  Condition 2: Firm Surface, EC 30 Sec, Normal Sway  Condition 3: Foam Surface, EO 30 Sec, Normal Sway  Condition 4: Foam Surface, EC 30 Sec Mild Sway     [facility] PT Assessment - 01/25/23 0927       Functional Gait  Assessment   Gait assessed  Yes    Gait Level Surface Walks 20 ft in less than 7 sec but greater than 5.5 sec, uses assistive device, slower speed, mild gait deviations, or deviates 6-10 in outside of the 12 in walkway width.   5.75 seconds   Change in Gait Speed Able to smoothly change walking speed without loss of balance or gait deviation. Deviate no more than 6 in outside of the 12 in walkway width.  Gait with Horizontal Head Turns Performs head turns smoothly with slight change in gait velocity (eg, minor disruption to smooth gait path), deviates 6-10 in outside 12 in walkway width, or uses an assistive device.    Gait with Vertical Head Turns Performs task with slight change in gait velocity (eg, minor disruption to smooth gait path), deviates 6 - 10 in outside 12 in walkway width or uses assistive device    Gait and Pivot Turn Pivot turns safely within 3 sec and stops quickly with no loss of balance.    Step Over Obstacle Is able to step over one shoe box (4.5 in total height) without changing gait speed. No evidence of imbalance.    Gait with Narrow Base of Support Ambulates 4-7 steps.    Gait with Eyes Closed Walks 20 ft, uses assistive device, slower speed, mild gait deviations, deviates 6-10 in outside 12 in walkway width. Ambulates 20 ft in less than 9 sec but greater than 7 sec.   8.1 seconds   Ambulating Backwards Walks 20 ft, uses assistive device, slower speed, mild gait deviations, deviates 6-10 in outside 12 in walkway width.   17.8 seconds   Steps Alternating feet, no rail.    Total Score 22    FGA comment: 22/30 = Medium fall risk               PATIENT EDUCATION: Education details: Clincial  findings, fall risk, pt not interested in PT at this time for her balance  Person educated: Patient Education method: Explanation Education comprehension: verbalized understanding  HOME EXERCISE PROGRAM:  GOALS: Goals not needed, pt not picked up for eval.    ASSESSMENT:  CLINICAL IMPRESSION: Patient is a 72 y.o. female who was seen today for physical therapy evaluation and treatment for imbalance/gait abnormality. Pt's PMH is significant for: Type 2 diabetes,HTN, hearing loss, HLD,  underwent emergent laparoscopic appendectomy on 12/04/22, ACDF in July 2022, glaucoma, hx of migraines . The following deficits were present during the exam: imbalance, muscle weakness. Based on FGA, pt is a medium risk for falls. Today's evaluation will be an arrived no charge. Pt is not interested in PT at this time for imbalance. Pt reports she feels like her balance will be better once her neck/shoulder pain gets better. Pt also reports the drive to this clinic will be too long for her. Pt not picked up for PT at this time     PLAN:  PT FREQUENCY: one time visit - eval only     Drake Leach, PT, DPT 01/25/2023, 9:51 AM

## 2023-01-26 ENCOUNTER — Encounter (HOSPITAL_BASED_OUTPATIENT_CLINIC_OR_DEPARTMENT_OTHER)
Admission: RE | Disposition: A | Discharge status: Home / Self Care | Payer: Self-pay | Source: Home / Self Care | Attending: Urology

## 2023-01-26 ENCOUNTER — Ambulatory Visit (HOSPITAL_COMMUNITY): Payer: PPO

## 2023-01-26 ENCOUNTER — Encounter (HOSPITAL_BASED_OUTPATIENT_CLINIC_OR_DEPARTMENT_OTHER): Payer: Self-pay | Admitting: Urology

## 2023-01-26 ENCOUNTER — Ambulatory Visit (HOSPITAL_BASED_OUTPATIENT_CLINIC_OR_DEPARTMENT_OTHER)
Admission: RE | Admit: 2023-01-26 | Discharge: 2023-01-26 | Disposition: A | Discharge status: Home / Self Care | Payer: PPO | Attending: Urology | Admitting: Urology

## 2023-01-26 ENCOUNTER — Other Ambulatory Visit: Payer: Self-pay

## 2023-01-26 DIAGNOSIS — N200 Calculus of kidney: Secondary | ICD-10-CM | POA: Diagnosis not present

## 2023-01-26 DIAGNOSIS — N201 Calculus of ureter: Secondary | ICD-10-CM | POA: Diagnosis not present

## 2023-01-26 HISTORY — PX: EXTRACORPOREAL SHOCK WAVE LITHOTRIPSY: SHX1557

## 2023-01-26 LAB — GLUCOSE, CAPILLARY
Glucose-Capillary: 96 mg/dL (ref 70–99)
Glucose-Capillary: 74 mg/dL (ref 70–99)

## 2023-01-26 SURGERY — LITHOTRIPSY, ESWL
Anesthesia: LOCAL | Laterality: Left

## 2023-01-26 MED ORDER — CIPROFLOXACIN HCL 500 MG PO TABS
ORAL_TABLET | ORAL | Status: AC
  Filled 2023-01-26: qty 1

## 2023-01-26 MED ORDER — DIPHENHYDRAMINE HCL 25 MG PO CAPS
25.0000 mg | ORAL_CAPSULE | ORAL | Status: AC
  Administered 2023-01-26: 25 mg via ORAL

## 2023-01-26 MED ORDER — SODIUM CHLORIDE 0.9 % IV SOLN: INTRAVENOUS | Status: DC

## 2023-01-26 MED ORDER — CIPROFLOXACIN HCL 500 MG PO TABS
500.0000 mg | ORAL_TABLET | ORAL | Status: AC
  Administered 2023-01-26: 500 mg via ORAL

## 2023-01-26 MED ORDER — DIAZEPAM 5 MG PO TABS
10.0000 mg | ORAL_TABLET | ORAL | Status: AC
  Administered 2023-01-26: 10 mg via ORAL

## 2023-01-26 MED ORDER — DIPHENHYDRAMINE HCL 25 MG PO CAPS
ORAL_CAPSULE | ORAL | Status: AC
  Filled 2023-01-26: qty 1

## 2023-01-26 MED ORDER — DIAZEPAM 5 MG PO TABS
ORAL_TABLET | ORAL | Status: AC
  Filled 2023-01-26: qty 2

## 2023-01-26 MED ORDER — SODIUM CHLORIDE 0.9% FLUSH: 3.0000 mL | Freq: Two times a day (BID) | INTRAVENOUS | Status: DC

## 2023-01-26 NOTE — Op Note (Signed)
See [place] note

## 2023-01-26 NOTE — Discharge Instructions (Addendum)

## 2023-01-26 NOTE — Interval H&P Note (Signed)
History and Physical Interval Note: NO change in stone.   01/26/2023 11:55 AM  Lauren James  has presented today for surgery, with the diagnosis of LEFT RENAL CALCULUS.  The various methods of treatment have been discussed with the patient and family. After consideration of risks, benefits and other options for treatment, the patient has consented to  Procedure(s): LEFT EXTRACORPOREAL SHOCK WAVE LITHOTRIPSY (ESWL) (Left) as a surgical intervention.  The patient's history has been reviewed, patient examined, no change in status, stable for surgery.  I have reviewed the patient's chart and labs.  Questions were answered to the patient's satisfaction.     Bjorn Pippin

## 2023-01-29 ENCOUNTER — Encounter (HOSPITAL_BASED_OUTPATIENT_CLINIC_OR_DEPARTMENT_OTHER): Payer: Self-pay | Admitting: Urology

## 2023-02-05 DIAGNOSIS — Z8639 Personal history of other endocrine, nutritional and metabolic disease: Secondary | ICD-10-CM | POA: Diagnosis not present

## 2023-02-05 DIAGNOSIS — Z87442 Personal history of urinary calculi: Secondary | ICD-10-CM | POA: Diagnosis not present

## 2023-02-05 DIAGNOSIS — E1169 Type 2 diabetes mellitus with other specified complication: Secondary | ICD-10-CM | POA: Diagnosis not present

## 2023-02-05 DIAGNOSIS — M549 Dorsalgia, unspecified: Secondary | ICD-10-CM | POA: Diagnosis not present

## 2023-02-05 DIAGNOSIS — Z6823 Body mass index (BMI) 23.0-23.9, adult: Secondary | ICD-10-CM | POA: Diagnosis not present

## 2023-02-05 DIAGNOSIS — I10 Essential (primary) hypertension: Secondary | ICD-10-CM | POA: Diagnosis not present

## 2023-02-12 DIAGNOSIS — N200 Calculus of kidney: Secondary | ICD-10-CM | POA: Diagnosis not present

## 2023-03-11 DIAGNOSIS — R059 Cough, unspecified: Secondary | ICD-10-CM | POA: Diagnosis not present

## 2023-03-11 DIAGNOSIS — R509 Fever, unspecified: Secondary | ICD-10-CM | POA: Diagnosis not present

## 2023-05-22 DIAGNOSIS — H2513 Age-related nuclear cataract, bilateral: Secondary | ICD-10-CM | POA: Diagnosis not present

## 2023-05-22 DIAGNOSIS — E119 Type 2 diabetes mellitus without complications: Secondary | ICD-10-CM | POA: Diagnosis not present

## 2023-05-22 DIAGNOSIS — H40021 Open angle with borderline findings, high risk, right eye: Secondary | ICD-10-CM | POA: Diagnosis not present

## 2023-05-22 DIAGNOSIS — H401123 Primary open-angle glaucoma, left eye, severe stage: Secondary | ICD-10-CM | POA: Diagnosis not present

## 2023-05-22 DIAGNOSIS — H04123 Dry eye syndrome of bilateral lacrimal glands: Secondary | ICD-10-CM | POA: Diagnosis not present

## 2023-08-01 ENCOUNTER — Other Ambulatory Visit: Payer: Self-pay | Admitting: Family Medicine

## 2023-08-01 DIAGNOSIS — I700 Atherosclerosis of aorta: Secondary | ICD-10-CM | POA: Diagnosis not present

## 2023-08-01 DIAGNOSIS — Z86018 Personal history of other benign neoplasm: Secondary | ICD-10-CM | POA: Diagnosis not present

## 2023-08-01 DIAGNOSIS — G720 Drug-induced myopathy: Secondary | ICD-10-CM | POA: Diagnosis not present

## 2023-08-01 DIAGNOSIS — M8580 Other specified disorders of bone density and structure, unspecified site: Secondary | ICD-10-CM

## 2023-08-01 DIAGNOSIS — F330 Major depressive disorder, recurrent, mild: Secondary | ICD-10-CM | POA: Diagnosis not present

## 2023-08-01 DIAGNOSIS — E785 Hyperlipidemia, unspecified: Secondary | ICD-10-CM | POA: Diagnosis not present

## 2023-08-01 DIAGNOSIS — N181 Chronic kidney disease, stage 1: Secondary | ICD-10-CM | POA: Diagnosis not present

## 2023-08-01 DIAGNOSIS — N200 Calculus of kidney: Secondary | ICD-10-CM | POA: Diagnosis not present

## 2023-08-01 DIAGNOSIS — I10 Essential (primary) hypertension: Secondary | ICD-10-CM | POA: Diagnosis not present

## 2023-08-01 DIAGNOSIS — Z23 Encounter for immunization: Secondary | ICD-10-CM | POA: Diagnosis not present

## 2023-08-01 DIAGNOSIS — Z0000 Encounter for general adult medical examination without abnormal findings: Secondary | ICD-10-CM | POA: Diagnosis not present

## 2023-08-01 DIAGNOSIS — M47812 Spondylosis without myelopathy or radiculopathy, cervical region: Secondary | ICD-10-CM | POA: Diagnosis not present

## 2023-08-01 DIAGNOSIS — E1122 Type 2 diabetes mellitus with diabetic chronic kidney disease: Secondary | ICD-10-CM | POA: Diagnosis not present

## 2023-08-07 DIAGNOSIS — M8588 Other specified disorders of bone density and structure, other site: Secondary | ICD-10-CM | POA: Diagnosis not present

## 2023-08-07 DIAGNOSIS — Z8639 Personal history of other endocrine, nutritional and metabolic disease: Secondary | ICD-10-CM | POA: Diagnosis not present

## 2023-08-07 DIAGNOSIS — Z6823 Body mass index (BMI) 23.0-23.9, adult: Secondary | ICD-10-CM | POA: Diagnosis not present

## 2023-08-07 DIAGNOSIS — J300 Vasomotor rhinitis: Secondary | ICD-10-CM | POA: Diagnosis not present

## 2023-08-07 DIAGNOSIS — I10 Essential (primary) hypertension: Secondary | ICD-10-CM | POA: Diagnosis not present

## 2023-08-07 DIAGNOSIS — R632 Polyphagia: Secondary | ICD-10-CM | POA: Diagnosis not present

## 2023-08-07 DIAGNOSIS — E1169 Type 2 diabetes mellitus with other specified complication: Secondary | ICD-10-CM | POA: Diagnosis not present

## 2023-08-15 ENCOUNTER — Encounter: Payer: Self-pay | Admitting: Neurology

## 2023-09-18 DIAGNOSIS — J300 Vasomotor rhinitis: Secondary | ICD-10-CM | POA: Diagnosis not present

## 2023-09-18 DIAGNOSIS — Z6822 Body mass index (BMI) 22.0-22.9, adult: Secondary | ICD-10-CM | POA: Diagnosis not present

## 2023-09-18 DIAGNOSIS — I10 Essential (primary) hypertension: Secondary | ICD-10-CM | POA: Diagnosis not present

## 2023-09-18 DIAGNOSIS — Z8639 Personal history of other endocrine, nutritional and metabolic disease: Secondary | ICD-10-CM | POA: Diagnosis not present

## 2023-09-18 DIAGNOSIS — E1169 Type 2 diabetes mellitus with other specified complication: Secondary | ICD-10-CM | POA: Diagnosis not present

## 2023-09-18 DIAGNOSIS — M8588 Other specified disorders of bone density and structure, other site: Secondary | ICD-10-CM | POA: Diagnosis not present

## 2023-09-18 DIAGNOSIS — K760 Fatty (change of) liver, not elsewhere classified: Secondary | ICD-10-CM | POA: Diagnosis not present

## 2023-09-24 ENCOUNTER — Other Ambulatory Visit: Payer: Self-pay | Admitting: Family Medicine

## 2023-09-24 DIAGNOSIS — Z1231 Encounter for screening mammogram for malignant neoplasm of breast: Secondary | ICD-10-CM

## 2023-10-08 ENCOUNTER — Ambulatory Visit
Admission: RE | Admit: 2023-10-08 | Discharge: 2023-10-08 | Disposition: A | Discharge status: Home / Self Care | Source: Ambulatory Visit | Attending: Family Medicine | Admitting: Family Medicine

## 2023-10-08 DIAGNOSIS — Z1231 Encounter for screening mammogram for malignant neoplasm of breast: Secondary | ICD-10-CM

## 2023-10-10 ENCOUNTER — Encounter: Payer: Self-pay | Admitting: Neurology

## 2023-10-10 ENCOUNTER — Ambulatory Visit: Admitting: Neurology

## 2023-10-10 VITALS — BP 124/58 | HR 60 | Ht 64.0 in | Wt 134.0 lb

## 2023-10-10 DIAGNOSIS — M79602 Pain in left arm: Secondary | ICD-10-CM

## 2023-10-10 DIAGNOSIS — M79601 Pain in right arm: Secondary | ICD-10-CM

## 2023-10-10 DIAGNOSIS — R258 Other abnormal involuntary movements: Secondary | ICD-10-CM | POA: Diagnosis not present

## 2023-10-10 DIAGNOSIS — R202 Paresthesia of skin: Secondary | ICD-10-CM

## 2023-10-10 NOTE — Progress Notes (Unsigned)
 [facility]rology [facility] Clinic Note - Initial Visit   Date: 10/10/2023   Lauren James MRN: [id number] DOB: 11-04-1950   Dear Dr. Teresa:  Thank you for your kind referral of Lauren James for consultation of numbness. Although her history is well known to you, please allow us  to reiterate it for the purpose of our medical record. The patient was accompanied to the clinic by self.    Lauren James is a 73 y.o. right-handed female with history of cervical decompression presenting for evaluation of whole body numbness.   IMPRESSION/PLAN: Myriad of symptoms including episodic whole body numbness, generalized paresthesia, involuntary muscle jerks, and chronic pain.  Neurological exam is normal without any focal deficits. I explained that she had many diffuse symptoms, some of which do not anatomically fit any condition, such as whole body numbness.   Prior MRI cervical and thoracic spine does not show structural pathology to explain any of her symptoms. To further evaluate symptoms, MRI brain wo contrast was discussed.  If this is normal, NCS/EMG will be the next step.  Pain management referral was declined.  ------------------------------------------------------------- History of present illness: For the past 5 years, she has whole body numbness upon wakening which lasts 5 minutes.  This occurs daily and self resolves.  She also has occasional numbness of the right little finger.  Over the past few months, she has noticed intermittent jerking of her arms and legs.  There is no pattern to these symptoms or specific triggers.  She has chronic low back pain, neck pain, and shoulder pain.  She takes gabapentin 300mg  at bedtime.  She history of neck surgery by Dr. Beuford in 2022 for spinal cord compression at C4-C7.   Out-side paper records, electronic medical record, and images have been reviewed where available and summarized as:  MRI cervical and thoracic spine  08/30/2022: IMPRESSION: Cervical MRI: Uncomplicated C4-C7 ACDF. No interval adjacent segment degeneration or neural impingement.  Thoracic MRI: Less than typical degenerative changes for age. No impingement or inflammation seen throughout the thoracic spine.   Lab Results  Component Value Date   HGBA1C 6.5 (H) 10/11/2020    Past Medical History:  Diagnosis Date   Arthritis    Atypical chest pain 04/12/2017   Back pain    Diabetes mellitus type 2 in obese 04/12/2017   Dysuria    Frequency of urination    GERD (gastroesophageal reflux disease)    WATCHES DIET, pt denies   Hematuria    History of kidney problems    History of kidney stones    Hyperlipidemia 04/12/2017   Hypertension    Nocturia    PONV (postoperative nausea and vomiting)    Prediabetes    DIET CONTROLLED   Right ureteral stone    Urgency of urination    Vitamin D  deficiency    Wears glasses     Past Surgical History:  Procedure Laterality Date   ANTERIOR CERVICAL DECOMPRESSION/DISCECTOMY FUSION 4 LEVELS N/A 10/14/2020   Procedure: ANTERIOR CERVICAL DECOMPRESSION FUSION CERVICAL 4 - CERVICAL 5, CERVICAL 5 - CERVICAL 6, CERVICAL 6- CERVICAL 7;  Surgeon: Beuford Anes, MD;  Location: [facility] OR;  Service: Orthopedics;  Laterality: N/A;   CHOLECYSTECTOMY  2000  (APPROX)   CYSTOSCOPY W/ URETERAL STENT PLACEMENT Right 12/13/2012   Procedure: CYSTOSCOPY/RIGHT URETERAL STENT EXCHANGE/RIGHT RETROGRADE PYELOGRAM;  Surgeon: Morene LELON Salines, MD;  Location: [facility];  Service: Urology;  Laterality: Right;   CYSTOSCOPY WITH URETEROSCOPY Right 11/27/2012  Procedure: CYSTOSCOPY WITH RIGHT  URETEROSCOPY AND STENT PLACEMENT ;  Surgeon: Noretta Ferrara, MD;  Location: [facility];  Service: Urology;  Laterality: Right;   EXTRACORPOREAL SHOCK WAVE LITHOTRIPSY  1994  (APPROX)   EXTRACORPOREAL SHOCK WAVE LITHOTRIPSY Left 01/26/2023   Procedure: LEFT EXTRACORPOREAL SHOCK WAVE LITHOTRIPSY (ESWL);  Surgeon: Watt Rush, MD;   Location: [facility];  Service: Urology;  Laterality: Left;   HOLMIUM LASER APPLICATION Right 11/27/2012   Procedure: HOLMIUM LASER APPLICATION;  Surgeon: Noretta Ferrara, MD;  Location: [facility];  Service: Urology;  Laterality: Right;   HOLMIUM LASER APPLICATION Right 12/13/2012   Procedure: HOLMIUM LASER APPLICATION;  Surgeon: Morene LELON Salines, MD;  Location: [facility];  Service: Urology;  Laterality: Right;   LAPAROSCOPIC APPENDECTOMY N/A 12/04/2022   Procedure: APPENDECTOMY LAPAROSCOPIC;  Surgeon: Dasie Leonor CROME, MD;  Location: [facility] OR;  Service: General;  Laterality: N/A;   URETEROSCOPY Right 12/13/2012   Procedure: URETEROSCOPY;  Surgeon: Morene LELON Salines, MD;  Location: [facility];  Service: Urology;  Laterality: Right;     Medications:  Outpatient Encounter Medications as of 10/10/2023  Medication Sig   cholecalciferol (VITAMIN D3) 25 MCG (1000 UNIT) tablet Take 1,000 Units by mouth daily.   Cyanocobalamin  (VITAMIN B-12 PO) Take by mouth.   gabapentin (NEURONTIN) 300 MG capsule Take 300 mg by mouth at bedtime.   glucose blood (IGLUCOSE TEST STRIPS) test strip Twice daily (use Bayer brand or whichever ins covers)   latanoprost (XALATAN) 0.005 % ophthalmic solution 1 drop at bedtime.   MOUNJARO 10 MG/0.5ML Pen Inject 10 mg into the skin once a week.   Multiple Vitamin (MULTIVITAMIN WITH MINERALS) TABS tablet Take 1 tablet by mouth daily.   timolol (TIMOPTIC) 0.5 % ophthalmic solution Place 1 drop into both eyes every morning.   valsartan (DIOVAN) 320 MG tablet Take 320 mg by mouth daily.   oxyCODONE  (OXY IR/ROXICODONE ) 5 MG immediate release tablet Take 1 tablet (5 mg total) by mouth every 6 (six) hours as needed for severe pain.   No facility-administered encounter medications on file as of 10/10/2023.    Allergies:  Allergies  Allergen Reactions   Adhesive [Tape] Itching and Other (See Comments)    BLISTERS, if worn for a long period of  time   Atorvastatin Calcium     Other Reaction(s): nausea/achy   Fenofibrate     Other Reaction(s): elevated sugar, fatigue   Omega-3-Acid Ethyl Esters Other (See Comments)   Pioglitazone Other (See Comments)   Rosuvastatin     Other Reaction(s): night sweats/achy    Family History: Family History  Problem Relation Age of Onset   Diabetes Mother    Kidney disease Mother    Heart disease Mother    High blood pressure Mother    Stroke Mother    Obesity Mother    Dementia Father    Stroke Father    Heart attack Sister    Diabetes Sister    Diabetes Maternal Grandmother    Heart attack Maternal Grandfather    Alcoholism Paternal Grandfather    Diabetes Sister    Osteoporosis Sister     Social History: Social History   Tobacco Use   Smoking status: Never   Smokeless tobacco: Never  Vaping Use   Vaping status: Never Used  Substance Use Topics   Alcohol use: No   Drug use: No   Social History   Social History Narrative   Living w/ husband  Right handed   2 cups coffee in the morning   Hobbies: oil paint    Vital Signs:  BP (!) 124/58 (BP Location: Left Arm, Patient Position: Sitting, Cuff Size: Normal)   Pulse 60   Ht 5' 4 (1.626 m)   Wt 134 lb (60.8 kg)   SpO2 98%   BMI 23.00 kg/m   Neurological Exam: MENTAL STATUS including orientation to time, place, person, recent and remote memory, attention span and concentration, language, and fund of knowledge is normal.  Speech is not dysarthric.  CRANIAL NERVES: II:  No visual field defects.     III-IV-VI: Pupils equal round and reactive to light.  Normal conjugate, extra-ocular eye movements in all directions of gaze.  No nystagmus.  No ptosis.   V:  Normal facial sensation.    VII:  Normal facial symmetry and movements.   VIII:  Normal hearing and vestibular function.   IX-X:  Normal palatal movement.   XI:  Normal shoulder shrug and head rotation.   XII:  Normal tongue strength and range of motion, no  deviation or fasciculation.  MOTOR:  No atrophy, fasciculations or abnormal movements.  No pronator drift.   Upper Extremity:  Right  Left  Deltoid  5/5   5/5   Biceps  5/5   5/5   Triceps  5/5   5/5   Wrist extensors  5/5   5/5   Wrist flexors  5/5   5/5   Finger extensors  5/5   5/5   Finger flexors  5/5   5/5   Dorsal interossei  5/5   5/5   Abductor pollicis  5/5   5/5   Tone (Ashworth scale)  0  0   Lower Extremity:  Right  Left  Hip flexors  5/5   5/5   Knee flexors  5/5   5/5   Knee extensors  5/5   5/5   Dorsiflexors  5/5   5/5   Plantarflexors  5/5   5/5   Toe extensors  5/5   5/5   Toe flexors  5/5   5/5   Tone (Ashworth scale)  0  0   MSRs:                                           Right        Left brachioradialis 2+  2+  biceps 2+  2+  triceps 2+  2+  patellar 2+  2+  ankle jerk 2+  2+  Hoffman no  no  plantar response down  down   SENSORY:  Normal and symmetric perception of light touch, pinprick, vibration, and proprioception.  Romberg's sign absent.   COORDINATION/GAIT: Normal finger-to- nose-finger.  Intact rapid alternating movements bilaterally.  Gait narrow based and stable.    Thank you for allowing me to participate in patient's care.  If I can answer any additional questions, I would be pleased to do so.    Sincerely,    Ariellah Faust K. Tobie, DO

## 2023-10-10 NOTE — Patient Instructions (Signed)
MRI brain without contrast

## 2023-10-30 ENCOUNTER — Ambulatory Visit
Admission: RE | Admit: 2023-10-30 | Discharge: 2023-10-30 | Disposition: A | Discharge status: Home / Self Care | Source: Ambulatory Visit | Attending: Neurology

## 2023-10-30 DIAGNOSIS — M79602 Pain in left arm: Secondary | ICD-10-CM

## 2023-10-30 DIAGNOSIS — R202 Paresthesia of skin: Secondary | ICD-10-CM

## 2023-10-30 DIAGNOSIS — R258 Other abnormal involuntary movements: Secondary | ICD-10-CM

## 2023-11-02 ENCOUNTER — Other Ambulatory Visit (HOSPITAL_BASED_OUTPATIENT_CLINIC_OR_DEPARTMENT_OTHER)

## 2023-11-06 ENCOUNTER — Inpatient Hospital Stay (HOSPITAL_BASED_OUTPATIENT_CLINIC_OR_DEPARTMENT_OTHER)
Admission: RE | Admit: 2023-11-06 | Discharge: 2023-11-06 | Discharge status: Home / Self Care | Source: Ambulatory Visit | Attending: Family Medicine | Admitting: Family Medicine

## 2023-11-06 DIAGNOSIS — M8589 Other specified disorders of bone density and structure, multiple sites: Secondary | ICD-10-CM | POA: Diagnosis not present

## 2023-11-06 DIAGNOSIS — Z780 Asymptomatic menopausal state: Secondary | ICD-10-CM | POA: Diagnosis not present

## 2023-11-06 DIAGNOSIS — M8580 Other specified disorders of bone density and structure, unspecified site: Secondary | ICD-10-CM | POA: Diagnosis not present

## 2023-11-08 ENCOUNTER — Ambulatory Visit: Payer: Self-pay | Admitting: Neurology

## 2023-11-08 DIAGNOSIS — R202 Paresthesia of skin: Secondary | ICD-10-CM

## 2023-11-14 DIAGNOSIS — I10 Essential (primary) hypertension: Secondary | ICD-10-CM | POA: Diagnosis not present

## 2023-11-14 DIAGNOSIS — E1169 Type 2 diabetes mellitus with other specified complication: Secondary | ICD-10-CM | POA: Diagnosis not present

## 2023-11-14 DIAGNOSIS — Z8639 Personal history of other endocrine, nutritional and metabolic disease: Secondary | ICD-10-CM | POA: Diagnosis not present

## 2023-11-14 DIAGNOSIS — Z6822 Body mass index (BMI) 22.0-22.9, adult: Secondary | ICD-10-CM | POA: Diagnosis not present

## 2023-11-14 DIAGNOSIS — M542 Cervicalgia: Secondary | ICD-10-CM | POA: Diagnosis not present

## 2023-11-14 DIAGNOSIS — K760 Fatty (change of) liver, not elsewhere classified: Secondary | ICD-10-CM | POA: Diagnosis not present

## 2023-11-19 DIAGNOSIS — H401123 Primary open-angle glaucoma, left eye, severe stage: Secondary | ICD-10-CM | POA: Diagnosis not present

## 2023-11-19 DIAGNOSIS — H2513 Age-related nuclear cataract, bilateral: Secondary | ICD-10-CM | POA: Diagnosis not present

## 2023-11-19 DIAGNOSIS — H40021 Open angle with borderline findings, high risk, right eye: Secondary | ICD-10-CM | POA: Diagnosis not present

## 2023-11-28 ENCOUNTER — Encounter (INDEPENDENT_AMBULATORY_CARE_PROVIDER_SITE_OTHER): Payer: Self-pay

## 2024-01-03 ENCOUNTER — Ambulatory Visit: Admitting: Neurology

## 2024-01-03 DIAGNOSIS — R202 Paresthesia of skin: Secondary | ICD-10-CM | POA: Diagnosis not present

## 2024-01-03 NOTE — Procedures (Signed)
 [facility]  [address] [place], [address]  [place], [place] [address] Tel: [phone] Fax: [phone] Test Date:  01/03/2024  Patient: Lauren James DOB: 03-12-1951 Physician: Tonita Blanch, DO  Sex: Female Height: 5' 4 Ref Phys: Tonita Blanch, DO  ID#: [id number]   Technician:    History: This is a 73 year old female referred for evaluation of generalized numbness.  NCV & EMG Findings: Extensive electrodiagnostic testing of the left upper and lower extremity shows:  All sensory responses including the left median, ulnar, mixed palmar, sural, and superficial peroneal sensory responses are within normal limits.   All motor responses including the left median, ulnar, peroneal, and tibial motor nerves are within normal limits.   Left tibial H reflex study is within normal limits. There is no evidence of active or chronic motor axonal loss changes affecting any of the tested muscles.    Impression: This is a normal study of the left upper and lower extremities.  In particular, there is no evidence of a large fiber sensorimotor polyneuropathy or cervical/lumbosacral radiculopathy.   ___________________________ Tonita Blanch, DO    Nerve Conduction Studies   Stim Site NR Peak (ms) Norm Peak (ms) O-P Amp (V) Norm O-P Amp  Left Median Anti Sensory (2nd Digit)  32 C  Wrist    3.3 <3.8 46.4 >10  Left Sup Peroneal Anti Sensory (Ant Lat Mall)  32 C  12 cm    3.5 <4.6 6.8 >3  Left Sural Anti Sensory (Lat Mall)  32 C  Calf    3.9 <4.6 9.2 >3  Left Ulnar Anti Sensory (5th Digit)  32 C  Wrist    2.9 <3.2 29.3 >5     Stim Site NR Onset (ms) Norm Onset (ms) O-P Amp (mV) Norm O-P Amp Site1 Site2 Delta-0 (ms) Dist (cm) Vel (m/s) Norm Vel (m/s)  Left Median Motor (Abd Poll Brev)  32 C  Wrist    3.0 <4.0 6.9 >5 Elbow Wrist 5.5 29.0 53 >50  Elbow    8.5  6.4         Left Peroneal Motor (Ext Dig Brev)  32 C  Ankle    3.8 <6.0 2.9 >2.5 B Fib Ankle 7.8 31.0 40 >40  B Fib     11.6  2.7  Poplt B Fib 1.6 7.0 44 >40  Poplt    13.2  2.6         Left Tibial Motor (Abd Hall Brev)  32 C  Ankle    3.4 <6.0 14.2 >4 Knee Ankle 7.8 40.0 51 >40  Knee    11.2  10.5         Left Ulnar Motor (Abd Dig Minimi)  32 C  Wrist    2.3 <3.1 10.7 >7 B Elbow Wrist 3.5 20.0 57 >50  B Elbow    5.8  9.1  A Elbow B Elbow 1.9 10.0 53 >50  A Elbow    7.7  8.7            Stim Site NR Peak (ms) Norm Peak (ms) P-T Amp (V) Site1 Site2 Delta-P (ms) Norm Delta (ms)  Left Median/Ulnar Palm Comparison (Wrist - 8cm)  32 C  Median Palm    1.5 <2.2 44.7 Median Palm Ulnar Palm 0.0   Ulnar Palm    1.5 <2.2 11.1       Electromyography   Side Muscle Ins.Act Fibs Fasc Recrt Amp Dur Poly Activation Comment  Left 1stDorInt Nml Nml Nml  Nml Nml Nml Nml Nml N/A  Left PronatorTeres Nml Nml Nml Nml Nml Nml Nml Nml N/A  Left Biceps Nml Nml Nml Nml Nml Nml Nml Nml N/A  Left Triceps Nml Nml Nml Nml Nml Nml Nml Nml N/A  Left Deltoid Nml Nml Nml Nml Nml Nml Nml Nml N/A  Left AntTibialis Nml Nml Nml Nml Nml Nml Nml Nml N/A  Left Gastroc Nml Nml Nml Nml Nml Nml Nml Nml N/A  Left Flex Dig Long Nml Nml Nml Nml Nml Nml Nml Nml N/A  Left RectFemoris Nml Nml Nml Nml Nml Nml Nml Nml N/A  Left GluteusMed Nml Nml Nml Nml Nml Nml Nml Nml N/A      Waveforms:

## 2024-01-04 ENCOUNTER — Ambulatory Visit: Payer: Self-pay | Admitting: Neurology

## 2024-01-15 ENCOUNTER — Ambulatory Visit (INDEPENDENT_AMBULATORY_CARE_PROVIDER_SITE_OTHER)

## 2024-01-15 ENCOUNTER — Encounter (INDEPENDENT_AMBULATORY_CARE_PROVIDER_SITE_OTHER): Payer: Self-pay

## 2024-01-15 VITALS — BP 159/73 | HR 61 | Ht 64.5 in | Wt 136.0 lb

## 2024-01-15 DIAGNOSIS — J300 Vasomotor rhinitis: Secondary | ICD-10-CM

## 2024-01-15 DIAGNOSIS — H6505 Acute serous otitis media, recurrent, left ear: Secondary | ICD-10-CM | POA: Diagnosis not present

## 2024-01-15 DIAGNOSIS — J342 Deviated nasal septum: Secondary | ICD-10-CM

## 2024-01-15 DIAGNOSIS — J343 Hypertrophy of nasal turbinates: Secondary | ICD-10-CM | POA: Diagnosis not present

## 2024-01-15 DIAGNOSIS — J320 Chronic maxillary sinusitis: Secondary | ICD-10-CM

## 2024-01-15 MED ORDER — AMOXICILLIN-POT CLAVULANATE 875-125 MG PO TABS: 1.0000 | ORAL_TABLET | Freq: Two times a day (BID) | ORAL | 0 refills | Status: AC

## 2024-01-15 MED ORDER — FLUTICASONE PROPIONATE 50 MCG/ACT NA SUSP: 2.0000 | Freq: Every day | NASAL | 6 refills | Status: AC

## 2024-01-15 MED ORDER — IPRATROPIUM BROMIDE 0.03 % NA SOLN
2.0000 | Freq: Three times a day (TID) | NASAL | 12 refills | Status: AC | PRN
Start: 2024-01-15 — End: ?

## 2024-01-15 NOTE — Progress Notes (Signed)
 Patient has not taken BP medication this morning.

## 2024-01-15 NOTE — Progress Notes (Signed)
 Dear Dr. Teresa, Here is my assessment for our mutual patient, Lauren James. Thank you for allowing me the opportunity to care for your patient. Please do not hesitate to contact me should you have any other questions. Sincerely, Dr. Penne Croak  Otolaryngology Clinic Note Referring provider: Dr. Teresa HPI:  Discussed the use of AI scribe software for clinical note transcription with the patient, who gave verbal consent to proceed. History of Present Illness Lauren James is a 73 year old female with diabetes who presents with sinus issues and involuntary movements.  Rhinorrhea and sinus symptoms - Persistent runny nose for over one year - Nasal discharge worsens with eating and in different temperature environments - Symptoms cause social embarrassment - History of sinus issues, including prior sinus infection - Ear tubes placed previously for sinus infection - No recent antibiotic use - No known antibiotic allergies  Vertigo - Vertigo upon waking, described as a rocking sensation until fully awake - Symptoms ongoing for an extended period  Involuntary movements - Involuntary arm movements at night, including unexpected arm lifting - Symptoms are frightening  Peripheral neuropathy and musculoskeletal pain - Numbness, tingling, and pain in arms and legs - Hands become numb if remaining still - Back and neck pain, sometimes associated with cramping - Pain and cramping can prevent walking and other activities - Muscle aches attributed to diabetes - Increased water intake attempted for symptom relief, resulting only in increased urination  Hearing difficulties - Hearing issues with static and echoing when using hearing aids - Symptoms attributed to hearing aid devices rather than hearing loss - Hearing aids have required multiple repairs - Currently using a different pair of hearing aids that do not connect to her phone  Independent Review of Additional Tests or Records:   Reviewed external note from referring PCP, White,describing RElevant history incorporated into today's evaluation. I personally reviewed and interpreted MRI from 2025 and head CT 2023.  Head CT from 2023 - no evidence of sinusitis. DNS present. MRI from 10/2023 demonstrated right sided maxillary sinusitis and mild mastoid effusion bilaterally.   PMH/Meds/All/SocHx/FamHx/ROS:   Past Medical History:  Diagnosis Date   Arthritis    Atypical chest pain 04/12/2017   Back pain    Diabetes mellitus type 2 in obese 04/12/2017   Dysuria    Frequency of urination    GERD (gastroesophageal reflux disease)    WATCHES DIET, pt denies   Hematuria    History of kidney problems    History of kidney stones    Hyperlipidemia 04/12/2017   Hypertension    Nocturia    PONV (postoperative nausea and vomiting)    Prediabetes    DIET CONTROLLED   Right ureteral stone    Urgency of urination    Vitamin D  deficiency    Wears glasses      Past Surgical History:  Procedure Laterality Date   ANTERIOR CERVICAL DECOMPRESSION/DISCECTOMY FUSION 4 LEVELS N/A 10/14/2020   Procedure: ANTERIOR CERVICAL DECOMPRESSION FUSION CERVICAL 4 - CERVICAL 5, CERVICAL 5 - CERVICAL 6, CERVICAL 6- CERVICAL 7;  Surgeon: Beuford Anes, MD;  Location: [facility];  Service: Orthopedics;  Laterality: N/A;   CHOLECYSTECTOMY  2000  (APPROX)   CYSTOSCOPY W/ URETERAL STENT PLACEMENT Right 12/13/2012   Procedure: CYSTOSCOPY/RIGHT URETERAL STENT EXCHANGE/RIGHT RETROGRADE PYELOGRAM;  Surgeon: Morene LELON Salines, MD;  Location: [facility];  Service: Urology;  Laterality: Right;   CYSTOSCOPY WITH URETEROSCOPY Right 11/27/2012   Procedure: CYSTOSCOPY WITH RIGHT  URETEROSCOPY AND  STENT PLACEMENT ;  Surgeon: Noretta Ferrara, MD;  Location: [facility];  Service: Urology;  Laterality: Right;   EXTRACORPOREAL SHOCK WAVE LITHOTRIPSY  1994  (APPROX)   EXTRACORPOREAL SHOCK WAVE LITHOTRIPSY Left 01/26/2023   Procedure: LEFT EXTRACORPOREAL SHOCK WAVE  LITHOTRIPSY (ESWL);  Surgeon: Watt Rush, MD;  Location: [facility];  Service: Urology;  Laterality: Left;   HOLMIUM LASER APPLICATION Right 11/27/2012   Procedure: HOLMIUM LASER APPLICATION;  Surgeon: Noretta Ferrara, MD;  Location: [facility];  Service: Urology;  Laterality: Right;   HOLMIUM LASER APPLICATION Right 12/13/2012   Procedure: HOLMIUM LASER APPLICATION;  Surgeon: Morene LELON Salines, MD;  Location: [facility];  Service: Urology;  Laterality: Right;   LAPAROSCOPIC APPENDECTOMY N/A 12/04/2022   Procedure: APPENDECTOMY LAPAROSCOPIC;  Surgeon: Dasie Leonor CROME, MD;  Location: [facility] OR;  Service: General;  Laterality: N/A;   URETEROSCOPY Right 12/13/2012   Procedure: URETEROSCOPY;  Surgeon: Morene LELON Salines, MD;  Location: [facility];  Service: Urology;  Laterality: Right;    Family History  Problem Relation Age of Onset   Diabetes Mother    Kidney disease Mother    Heart disease Mother    High blood pressure Mother    Stroke Mother    Obesity Mother    Dementia Father    Stroke Father    Heart attack Sister    Diabetes Sister    Diabetes Maternal Grandmother    Heart attack Maternal Grandfather    Alcoholism Paternal Grandfather    Diabetes Sister    Osteoporosis Sister      Social Connections: Not on file      Current Outpatient Medications:    amoxicillin -clavulanate (AUGMENTIN ) 875-125 MG tablet, Take 1 tablet by mouth 2 (two) times daily for 14 days., Disp: 28 tablet, Rfl: 0   fluticasone (FLONASE) 50 MCG/ACT nasal spray, Place 2 sprays into both nostrils daily., Disp: 16 g, Rfl: 6   gabapentin (NEURONTIN) 300 MG capsule, Take 300 mg by mouth at bedtime., Disp: , Rfl:    ipratropium (ATROVENT) 0.03 % nasal spray, Place 2 sprays into both nostrils 3 (three) times daily as needed for rhinitis., Disp: 30 mL, Rfl: 12   latanoprost (XALATAN) 0.005 % ophthalmic solution, 1 drop at bedtime., Disp: , Rfl:    MOUNJARO 10 MG/0.5ML Pen, Inject  10 mg into the skin once a week., Disp: , Rfl:    Multiple Vitamin (MULTIVITAMIN WITH MINERALS) TABS tablet, Take 1 tablet by mouth daily., Disp: , Rfl:    orlistat (ALLI) 60 MG capsule, Take 60 mg by mouth daily., Disp: , Rfl:    timolol (TIMOPTIC) 0.5 % ophthalmic solution, Place 1 drop into both eyes every morning., Disp: , Rfl:    valsartan (DIOVAN) 320 MG tablet, Take 320 mg by mouth daily., Disp: , Rfl:    cholecalciferol (VITAMIN D3) 25 MCG (1000 UNIT) tablet, Take 1,000 Units by mouth daily. (Patient not taking: Reported on 01/15/2024), Disp: , Rfl:    Cyanocobalamin  (VITAMIN B-12 PO), Take by mouth. (Patient not taking: Reported on 01/15/2024), Disp: , Rfl:    glucose blood (IGLUCOSE TEST STRIPS) test strip, Twice daily (use Bayer brand or whichever ins covers) (Patient not taking: Reported on 01/15/2024), Disp: 100 each, Rfl: 0   oxyCODONE  (OXY IR/ROXICODONE ) 5 MG immediate release tablet, Take 1 tablet (5 mg total) by mouth every 6 (six) hours as needed for severe pain., Disp: 15 tablet, Rfl: 0   Physical Exam:   BP ROLLEN)  159/73   Pulse 61   Ht 5' 4.5 (1.638 m)   Wt 136 lb (61.7 kg)   SpO2 98%   BMI 22.98 kg/m   The patient was awake, alert, and appropriate. The external ears were inspected, and otoscopy was performed to evaluate the external auditory canals and tympanic membranes. The nasal cavity and septum were examined for mucosal changes, obstruction, or discharge. The oral cavity and oropharynx were inspected for mucosal lesions, infection, or tonsillar hypertrophy. The neck was palpated for lymphadenopathy, thyroid  abnormalities, or other masses. Cranial nerve function was grossly intact.  Pertinent Findings: Physical Exam HEENT: Oral cavity normal, pharynx normal, nasal septum deviated with swollen turbinates and crusting, monomeric membrane from previous tubes with fluid on left side.   Seprately Identifiable Procedures:  I personally ordered, reviewed and interpreted  the following with the patient today  Given the patient's symptoms and incomplete visualization of critical sinonasal areas with anterior rhinoscopy, a separately performed diagnostic nasal endoscopy procedure is indicated for a complete rhinologic evaluation per American Rhinologic Society recommendations (https://[email or web address]-rhinologic.org/position-statements)  I personally ordered, reviewed and interpreted the following with the patient today  Procedure Note Diagnostic Nasal Endoscopy CPT CODE -- 68768 - Mod 25  Prior to initiating any procedures, risks/benefits/alternatives were explained to the patient and verbal consent obtained.  Pre-procedure diagnosis: Concern for obstruction and sinusitis Post-procedure diagnosis: same Indication: See pre-procedure diagnosis and physical exam above Complications: None apparent EBL: 0 mL Anesthesia: Lidocaine  4% and topical decongestant was topically sprayed in each nasal cavity  Description of Procedure:  Patient was identified. A flexible fiberoptic endoscope was utilized to evaluate the sinonasal cavities, mucosa, sinus ostia and turbinates and septum.  Overall, signs of mucosal inflammation are noted.  Also noted are deviated nasal septum.  No mucopurulence, polyps, or masses noted.   Right Middle meatus: congested Right SE Recess: limited view Left MM: congested Left SE Recess: limited view Photodocumentation was obtained.  Impression & Plans:  Lauren James is a 73 y.o. female  1. Chronic maxillary sinusitis   2. Recurrent acute serous otitis media of left ear   3. DNS (deviated nasal septum)   4. Hypertrophy of inferior nasal turbinate   5. Vasomotor rhinitis     - Findings and diagnoses discussed in detail with the patient. - Risks, benefits, and alternatives were reviewed. Through shared decision making, the patient elects to proceed with below.  Assessment & Plan Chronic right maxillary sinusitis Chronic right maxillary  sinusitis with mucus development on MRI. Symptoms include nasal drainage and occasional facial pain. Over-the-counter nasal sprays ineffective. - Prescribe antibiotic for sinus infection. - Provide prescription for nasal spray. - Instruct on nasal rinses once daily. - Order dedicated CT of the sinuses if symptoms do not improve.  Left middle ear effusion Left middle ear effusion possibly causing intermittent hearing difficulties. Fluid presence likely chronic, unrelated to right sinus infection. - Recheck hearing in 8 weeks. - Evaluate ear pressure. - Assess response to nasal spray for ear symptoms.  Bilateral sensorineural hearing loss Bilateral sensorineural hearing loss with hearing aid issues, including static and echoing. Discrepancy between her experience and manufacturer's assessment. - Recheck hearing in 8 weeks. - Discuss hearing aid issues and potential need for new devices.  Benign scalp cyst Benign scalp cyst, smooth and not attached to bone. Not dangerous but cosmetically concerning to her. - Discuss potential removal if desired for cosmetic reasons.  - Orders placed:  Orders Placed This Encounter  Procedures   Ambulatory referral  to Audiology   - Medications prescribed/continued/adjusted:  Meds ordered this encounter  Medications   fluticasone (FLONASE) 50 MCG/ACT nasal spray    Sig: Place 2 sprays into both nostrils daily.    Dispense:  16 g    Refill:  6   amoxicillin -clavulanate (AUGMENTIN ) 875-125 MG tablet    Sig: Take 1 tablet by mouth 2 (two) times daily for 14 days.    Dispense:  28 tablet    Refill:  0   ipratropium (ATROVENT) 0.03 % nasal spray    Sig: Place 2 sprays into both nostrils 3 (three) times daily as needed for rhinitis.    Dispense:  30 mL    Refill:  12   - Education materials provided to the patient. - Follow up: 8 weeks, consider CT sinus scan and repeat nasal endo if not resolved.Consider tympanostomy tubes if type B tymp. Patient  instructed to return sooner or go to the ED if new/worsening symptoms develop.   Thank you for allowing me the opportunity to care for your patient. Please do not hesitate to contact me should you have any other questions.  Sincerely, Penne Croak, DO Otolaryngologist (ENT) [facility] Phone: [phone] Fax: [phone]  01/15/2024, 3:55 PM

## 2024-01-21 DIAGNOSIS — H903 Sensorineural hearing loss, bilateral: Secondary | ICD-10-CM | POA: Diagnosis not present

## 2024-01-24 DIAGNOSIS — M542 Cervicalgia: Secondary | ICD-10-CM | POA: Diagnosis not present

## 2024-01-24 DIAGNOSIS — Z6822 Body mass index (BMI) 22.0-22.9, adult: Secondary | ICD-10-CM | POA: Diagnosis not present

## 2024-01-24 DIAGNOSIS — E1169 Type 2 diabetes mellitus with other specified complication: Secondary | ICD-10-CM | POA: Diagnosis not present

## 2024-01-24 DIAGNOSIS — K760 Fatty (change of) liver, not elsewhere classified: Secondary | ICD-10-CM | POA: Diagnosis not present

## 2024-01-24 DIAGNOSIS — I10 Essential (primary) hypertension: Secondary | ICD-10-CM | POA: Diagnosis not present

## 2024-02-01 DIAGNOSIS — M791 Myalgia, unspecified site: Secondary | ICD-10-CM | POA: Diagnosis not present

## 2024-02-01 DIAGNOSIS — E1122 Type 2 diabetes mellitus with diabetic chronic kidney disease: Secondary | ICD-10-CM | POA: Diagnosis not present

## 2024-02-01 DIAGNOSIS — E559 Vitamin D deficiency, unspecified: Secondary | ICD-10-CM | POA: Diagnosis not present

## 2024-02-01 DIAGNOSIS — M47812 Spondylosis without myelopathy or radiculopathy, cervical region: Secondary | ICD-10-CM | POA: Diagnosis not present

## 2024-02-01 DIAGNOSIS — N181 Chronic kidney disease, stage 1: Secondary | ICD-10-CM | POA: Diagnosis not present

## 2024-02-01 DIAGNOSIS — R252 Cramp and spasm: Secondary | ICD-10-CM | POA: Diagnosis not present

## 2024-02-01 DIAGNOSIS — R0981 Nasal congestion: Secondary | ICD-10-CM | POA: Diagnosis not present

## 2024-02-01 DIAGNOSIS — G720 Drug-induced myopathy: Secondary | ICD-10-CM | POA: Diagnosis not present

## 2024-02-01 DIAGNOSIS — I10 Essential (primary) hypertension: Secondary | ICD-10-CM | POA: Diagnosis not present

## 2024-02-01 DIAGNOSIS — E785 Hyperlipidemia, unspecified: Secondary | ICD-10-CM | POA: Diagnosis not present

## 2024-02-04 DIAGNOSIS — H903 Sensorineural hearing loss, bilateral: Secondary | ICD-10-CM | POA: Diagnosis not present

## 2024-02-13 DIAGNOSIS — N3941 Urge incontinence: Secondary | ICD-10-CM | POA: Diagnosis not present

## 2024-02-13 DIAGNOSIS — N200 Calculus of kidney: Secondary | ICD-10-CM | POA: Diagnosis not present

## 2024-02-18 DIAGNOSIS — L570 Actinic keratosis: Secondary | ICD-10-CM | POA: Diagnosis not present

## 2024-02-18 DIAGNOSIS — D225 Melanocytic nevi of trunk: Secondary | ICD-10-CM | POA: Diagnosis not present

## 2024-02-18 DIAGNOSIS — X32XXXA Exposure to sunlight, initial encounter: Secondary | ICD-10-CM | POA: Diagnosis not present

## 2024-03-11 ENCOUNTER — Encounter (INDEPENDENT_AMBULATORY_CARE_PROVIDER_SITE_OTHER): Payer: Self-pay

## 2024-03-11 ENCOUNTER — Ambulatory Visit (INDEPENDENT_AMBULATORY_CARE_PROVIDER_SITE_OTHER)

## 2024-03-11 ENCOUNTER — Ambulatory Visit (INDEPENDENT_AMBULATORY_CARE_PROVIDER_SITE_OTHER): Admitting: Audiology

## 2024-03-11 VITALS — BP 128/75 | HR 60 | Temp 98.0°F | Wt 136.0 lb

## 2024-03-11 DIAGNOSIS — J342 Deviated nasal septum: Secondary | ICD-10-CM

## 2024-03-11 DIAGNOSIS — H90A22 Sensorineural hearing loss, unilateral, left ear, with restricted hearing on the contralateral side: Secondary | ICD-10-CM

## 2024-03-11 DIAGNOSIS — J3489 Other specified disorders of nose and nasal sinuses: Secondary | ICD-10-CM

## 2024-03-11 DIAGNOSIS — H6992 Unspecified Eustachian tube disorder, left ear: Secondary | ICD-10-CM

## 2024-03-11 DIAGNOSIS — H903 Sensorineural hearing loss, bilateral: Secondary | ICD-10-CM

## 2024-03-11 DIAGNOSIS — H90A21 Sensorineural hearing loss, unilateral, right ear, with restricted hearing on the contralateral side: Secondary | ICD-10-CM

## 2024-03-11 DIAGNOSIS — J343 Hypertrophy of nasal turbinates: Secondary | ICD-10-CM

## 2024-03-11 NOTE — Progress Notes (Signed)
 Dear Dr. Teresa, Here is my assessment for our mutual patient, Lauren James. Thank you for allowing me the opportunity to care for your patient. Please do not hesitate to contact me should you have any other questions. Sincerely, Dr. Penne Croak  Otolaryngology Clinic Note Referring provider: Dr. Teresa HPI:  Discussed the use of AI scribe software for clinical note transcription with the patient, who gave verbal consent to proceed.  History of Present Illness Lauren James is a 73 year old female with bilateral sensorineural hearing loss and vasomotor rhinitis who presents for evaluation of persistent hearing difficulties and chronic rhinorrhea.  Bilateral sensorineural hearing loss - Hearing loss present since late thirties, stable over time - Audiometric testing: word recognition scores 72% right ear, 76% left ear - Persistent difficulty understanding speech despite adequate volume - Comprehends only partial conversation, especially in social settings and with her husband who speaks softly - Frequently requests repetition and experiences misunderstandings - Current hearing aids obtained through [employer] insurance trial, preferred over previous devices due to reduced echo, but no improvement in speech discrimination - Previously trialed [employer] hearing aids and underwent audiometric evaluation at [facility] in 2020 with no significant change in hearing - No prior cochlear implant evaluation; aware she may be borderline for candidacy if hearing worsens  Chronic rhinorrhea - Chronic, continuous nasal drainage requiring frequent use of tissues - Symptoms are socially embarrassing and particularly bothersome in public - Symptomatic relief with ipratropium bromide  nasal spray, especially in the afternoon; recently ran out and plans to refill - Uses fluticasone  nasal spray during the day and Atrovent  in the morning, with instructions to use up to four times daily -  Previously completed an antibiotic course that improved overall well-being, possibly for a kidney infection  Imaging findings   Independent Review of Additional Tests or Records:  Reviewed external note from referring PCP, White,describing relevant history incorporated into todays evaluation.   Previous imaging MRI from 2025 and head CT 2023.  Head CT from 2023 - no evidence of sinusitis. DNS present.  MRI from 10/2023 demonstrated right sided maxillary sinusitis and mild mastoid effusion bilaterally.   Reviewed MRI again - no CPA or IAC mass/lesion     Audiogram and tympanogram Audiogram mild asymmetry. Right LF mild SNHL upsloping and then severe HF SNHL. Left with moderate SNHL upsloping then severe HF SNHL. Type A right and type c left. 73% word recognition right. 76% word recognition left.   PMH/Meds/All/SocHx/FamHx/ROS:   Past Medical History:  Diagnosis Date   Arthritis    Atypical chest pain 04/12/2017   Back pain    Diabetes mellitus type 2 in obese 04/12/2017   Dysuria    Frequency of urination    GERD (gastroesophageal reflux disease)    WATCHES DIET, pt denies   Hematuria    History of kidney problems    History of kidney stones    Hyperlipidemia 04/12/2017   Hypertension    Nocturia    PONV (postoperative nausea and vomiting)    Prediabetes    DIET CONTROLLED   Right ureteral stone    Urgency of urination    Vitamin D  deficiency    Wears glasses      Past Surgical History:  Procedure Laterality Date   ANTERIOR CERVICAL DECOMPRESSION/DISCECTOMY FUSION 4 LEVELS N/A 10/14/2020   Procedure: ANTERIOR CERVICAL DECOMPRESSION FUSION CERVICAL 4 - CERVICAL 5, CERVICAL 5 - CERVICAL 6, CERVICAL 6- CERVICAL 7;  Surgeon: Beuford Anes, MD;  Location:  [facility];  Service: Orthopedics;  Laterality: N/A;   CHOLECYSTECTOMY  2000  (APPROX)   CYSTOSCOPY W/ URETERAL STENT PLACEMENT Right 12/13/2012   Procedure: CYSTOSCOPY/RIGHT URETERAL STENT EXCHANGE/RIGHT RETROGRADE  PYELOGRAM;  Surgeon: Morene LELON Salines, MD;  Location: [facility];  Service: Urology;  Laterality: Right;   CYSTOSCOPY WITH URETEROSCOPY Right 11/27/2012   Procedure: CYSTOSCOPY WITH RIGHT  URETEROSCOPY AND STENT PLACEMENT ;  Surgeon: Noretta Ferrara, MD;  Location: [facility];  Service: Urology;  Laterality: Right;   EXTRACORPOREAL SHOCK WAVE LITHOTRIPSY  1994  (APPROX)   EXTRACORPOREAL SHOCK WAVE LITHOTRIPSY Left 01/26/2023   Procedure: LEFT EXTRACORPOREAL SHOCK WAVE LITHOTRIPSY (ESWL);  Surgeon: Watt Rush, MD;  Location: [facility];  Service: Urology;  Laterality: Left;   HOLMIUM LASER APPLICATION Right 11/27/2012   Procedure: HOLMIUM LASER APPLICATION;  Surgeon: Noretta Ferrara, MD;  Location: [facility];  Service: Urology;  Laterality: Right;   HOLMIUM LASER APPLICATION Right 12/13/2012   Procedure: HOLMIUM LASER APPLICATION;  Surgeon: Morene LELON Salines, MD;  Location: [facility];  Service: Urology;  Laterality: Right;   LAPAROSCOPIC APPENDECTOMY N/A 12/04/2022   Procedure: APPENDECTOMY LAPAROSCOPIC;  Surgeon: Dasie Leonor CROME, MD;  Location: [facility];  Service: General;  Laterality: N/A;   URETEROSCOPY Right 12/13/2012   Procedure: URETEROSCOPY;  Surgeon: Morene LELON Salines, MD;  Location: [facility];  Service: Urology;  Laterality: Right;    Family History  Problem Relation Age of Onset   Diabetes Mother    Kidney disease Mother    Heart disease Mother    High blood pressure Mother    Stroke Mother    Obesity Mother    Dementia Father    Stroke Father    Heart attack Sister    Diabetes Sister    Diabetes Maternal Grandmother    Heart attack Maternal Grandfather    Alcoholism Paternal Grandfather    Diabetes Sister    Osteoporosis Sister      Social Connections: Not on file     Current Medications[1]   Physical Exam:   BP 128/75 (BP Location: Right Arm, Patient Position: Sitting, Cuff Size: Normal)   Pulse 60   Temp 98 F (36.7 C)    Wt 136 lb (61.7 kg)   SpO2 98%   BMI 22.98 kg/m   The patient was awake, alert, and appropriate. The external ears were inspected, and otoscopy was performed to evaluate the external auditory canals and tympanic membranes. The nasal cavity and septum were examined for mucosal changes, obstruction, or discharge. The oral cavity and oropharynx were inspected for mucosal lesions, infection, or tonsillar hypertrophy. The neck was palpated for lymphadenopathy, thyroid  abnormalities, or other masses. Cranial nerve function was grossly intact.  Pertinent Findings: General: Well developed, well nourished. No acute distress. Voice without hoarseness Head/Face: Normocephalic. No sinus tenderness. Facial nerve intact and equal bilaterally. No facial lacerations. Eyes: PERRL, no scleral icterus or conjunctival hemorrhage. EOMI. Ears: No gross deformity. Normal external canal. Tympanic membrane with normal landmarks bilaterally Hearing: Normal speech reception.  Nose: No gross deformity or lesions. No purulent discharge. turbinate hypertrophy. Right DNS.  Mouth/Oropharynx: Lips without any lesions. Dentition good. No mucosal lesions within the oropharynx. No tonsillar enlargement, exudate, or lesions. Pharyngeal walls symmetrical. Uvula midline. Tongue midline without lesions. Larynx: See TFL if applicable Nasopharynx: See TFL if applicable Neck: Trachea midline. No masses. No thyromegaly or nodules palpated. No crepitus. Lymphatic: No lymphadenopathy in the neck. Respiratory: No stridor or distress.  Room air. Cardiovascular: Regular rate and rhythm. Extremities: No edema or cyanosis. Warm and well-perfused. Skin: No scars or lesions on face or neck. Neurologic: CN II-XII grossly intact. Moving all extremities without gross abnormality. Other:    Impression & Plans:  Lauren James is a 73 y.o. female  1. Sensorineural hearing loss (SNHL) of right ear with restricted hearing of left ear   2.  Sensorineural hearing loss (SNHL) of left ear with restricted hearing of right ear   3. Rhinorrhea   4. DNS (deviated nasal septum)   5. Hypertrophy of inferior nasal turbinate    - Findings and diagnoses discussed in detail with the patient. - Risks, benefits, and alternatives were reviewed. Through shared decision making, the patient elects to proceed with below.  Assessment & Plan Bilateral sensorineural hearing loss Chronic bilateral sensorineural hearing loss with stable audiometric results. Not a current cochlear implant candidate but may become eligible if hearing declines. - Referred for cochlear implant evaluation at [facility]. She is interested in knowing how far away she is from candidacy. Her hearing has not changed much from previous report in care everywhere - Reviewed audiometric results and current hearing aid performance. Doing better with recent hearing aids. - Discussed potential for future cochlear implantation if hearing deteriorates.  Vasomotor rhinitis Chronic vasomotor rhinitis with persistent rhinorrhea, partially responsive to current treatment. No acute sinus infection. - Advised ipratropium bromide  nasal spray up to four times daily as needed. - Instructed to contact pharmacy for medication refill; additional refills available. - Discussed higher concentration ipratropium bromide  if current dose inadequate. - Reinforced continued use of fluticasone  nasal spray. - Provided guidance to use nasal spray before social events.  - Orders placed:  Orders Placed This Encounter  Procedures   Ambulatory referral to Audiology   - Medications prescribed/continued/adjusted: No orders of the defined types were placed in this encounter.  - Education materials provided to the patient. - Follow up: after CI eval. Could consider septoplasty vs abx vs imaging for sinusitis. Patient instructed to return sooner or go to the ED if new/worsening symptoms develop.   Thank you for  allowing me the opportunity to care for your patient. Please do not hesitate to contact me should you have any other questions.  Sincerely, Penne Croak, DO Otolaryngologist (ENT) [facility] Phone: [phone] Fax: [phone]  03/11/2024, 11:04 PM        [1]  Current Outpatient Medications:    fluticasone  (FLONASE ) 50 MCG/ACT nasal spray, Place 2 sprays into both nostrils daily., Disp: 16 g, Rfl: 6   gabapentin (NEURONTIN) 300 MG capsule, Take 300 mg by mouth at bedtime., Disp: , Rfl:    ipratropium (ATROVENT ) 0.03 % nasal spray, Place 2 sprays into both nostrils 3 (three) times daily as needed for rhinitis., Disp: 30 mL, Rfl: 12   latanoprost (XALATAN) 0.005 % ophthalmic solution, 1 drop at bedtime., Disp: , Rfl:    MOUNJARO 10 MG/0.5ML Pen, Inject 10 mg into the skin once a week., Disp: , Rfl:    Multiple Vitamin (MULTIVITAMIN WITH MINERALS) TABS tablet, Take 1 tablet by mouth daily., Disp: , Rfl:    orlistat (ALLI) 60 MG capsule, Take 60 mg by mouth daily., Disp: , Rfl:    timolol (TIMOPTIC) 0.5 % ophthalmic solution, Place 1 drop into both eyes every morning., Disp: , Rfl:    valsartan (DIOVAN) 320 MG tablet, Take 320 mg by mouth daily., Disp: , Rfl:    cholecalciferol (VITAMIN D3) 25 MCG (1000  UNIT) tablet, Take 1,000 Units by mouth daily. (Patient not taking: Reported on 03/11/2024), Disp: , Rfl:    Cyanocobalamin  (VITAMIN B-12 PO), Take by mouth. (Patient not taking: Reported on 03/11/2024), Disp: , Rfl:    glucose blood (IGLUCOSE TEST STRIPS) test strip, Twice daily (use Bayer brand or whichever ins covers) (Patient not taking: Reported on 03/11/2024), Disp: 100 each, Rfl: 0

## 2024-03-11 NOTE — Progress Notes (Signed)
°  [address], [address] [place], [place] [address] [phone]  Audiological Evaluation    Name: Lauren James     DOB:   July 14, 1950      MRN:   [id number]                                                                                     Service Date: 03/11/2024     Accompanied by: self     Patient comes today after Dr. Anice, ENT sent a referral for a hearing evaluation due to concerns with hearing loss.   Symptoms Yes Details  Hearing loss  [x]  Maybe hearing loss as a child  Tinnitus  []    Ear pain/ infections/pressure  []    Balance problems  [x]  Dizziness right when she wakes up- blinks and gets better  Noise exposure history  []    Previous ear surgeries  []    Family history of hearing loss  []    Amplification  [x]  [employer] 2 months ago, has previous hearing aids from [facility]  Other  []      Otoscopy: Right ear: Clear external ear canal and notable landmarks visualized on the tympanic membrane. Left ear:  Abnormal eardrum appearance.  Tympanometry: Right ear: Type A - Normal external ear canal volume with normal middle ear pressure and normal tympanic membrane compliance. Findings are consistent with normal middle ear function. Left ear: Type C - Normal external ear canal volume with negative middle ear pressure and normal or reduced tympanic membrane compliance. Findings are consistent with Eustachian tube dysfunction.     Hearing Evaluation The hearing test results were completed under headphones and results are deemed to be of good reliability. Test technique:  conventional    Pure tone Audiometry: Right ear- Mild rising to normal hearing from [phone] Hz, then mild sloping to severe presumably sensorineural hearing loss from 6000 Hz - 8000 Hz. Left ear-  Mild to severe sensorineural hearing loss from 250 Hz - 8000 Hz.  Speech Audiometry: Right ear- Speech Reception Threshold (SRT) was obtained at 40 dBHL. Left ear-Speech Reception Threshold (SRT)  was obtained at 50 dBHL.   Word Recognition Score Tested using NU-6 (recorded) Right ear: 72% was obtained at a presentation level of 90 dBHL with contralateral masking which is deemed as  fair. Left ear: 76% was obtained at a presentation level of 90 dBHL with contralateral masking which is deemed as  fair.   Impression: There is not a significant difference in pure-tone thresholds between ears., There is not a significant difference in the word recognition score in between ears.    Recommendations: Follow up with ENT as scheduled. Return for a hearing evaluation if concerns with hearing changes arise or per MD recommendation. Consider a communication needs assessment for amplification after medical clearance is obtained, if needed.   Lauren James, AUD

## 2024-03-25 ENCOUNTER — Encounter: Payer: Self-pay | Admitting: Audiology

## 2024-04-03 ENCOUNTER — Telehealth (INDEPENDENT_AMBULATORY_CARE_PROVIDER_SITE_OTHER): Payer: Self-pay

## 2024-04-03 NOTE — Telephone Encounter (Signed)
 Patient called call was answered. Patient stated she was suppose to be getting a referral from us  to [facility] she states she has not received a call from there. Can you please advise

## 2024-04-10 NOTE — Telephone Encounter (Signed)
 Good morning!  I received a message from Dr. Billy at [facility] stating she had a chance to look over the patient's audiogram and she is not a candidate for a Cochlear Implant.  Dr. Billy also stated that they would be happy to see the patient regarding hearing devices.  I called the patient per Dr. Willeen suggestion and left a voicemail message letting her know that she is a ways away from a Cochlear Implant.  I also let her know that [facility] will be reaching out to her and I gave her their phone # in case she wanted to contact them directly.  Thank you. Ermelinda

## 2024-04-23 ENCOUNTER — Other Ambulatory Visit

## 2024-06-02 ENCOUNTER — Ambulatory Visit: Admitting: Internal Medicine

## 2024-06-16 ENCOUNTER — Ambulatory Visit (INDEPENDENT_AMBULATORY_CARE_PROVIDER_SITE_OTHER)
